# Patient Record
Sex: Male | Born: 1950 | Race: White | Hispanic: No | Marital: Married | Smoking: Former smoker
Health system: Southern US, Community
[De-identification: ages and names within clinical notes are randomized; demographics above are authoritative.]

## PROBLEM LIST (undated history)

## (undated) DIAGNOSIS — L4050 Arthropathic psoriasis, unspecified: Secondary | ICD-10-CM

## (undated) DIAGNOSIS — L0591 Pilonidal cyst without abscess: Secondary | ICD-10-CM

## (undated) DIAGNOSIS — M1990 Unspecified osteoarthritis, unspecified site: Secondary | ICD-10-CM

## (undated) DIAGNOSIS — L409 Psoriasis, unspecified: Secondary | ICD-10-CM

## (undated) DIAGNOSIS — L723 Sebaceous cyst: Secondary | ICD-10-CM

## (undated) DIAGNOSIS — G629 Polyneuropathy, unspecified: Secondary | ICD-10-CM

## (undated) DIAGNOSIS — K219 Gastro-esophageal reflux disease without esophagitis: Secondary | ICD-10-CM

## (undated) DIAGNOSIS — L0501 Pilonidal cyst with abscess: Secondary | ICD-10-CM

## (undated) DIAGNOSIS — S7001XA Contusion of right hip, initial encounter: Secondary | ICD-10-CM

## (undated) DIAGNOSIS — W1789XA Other fall from one level to another, initial encounter: Secondary | ICD-10-CM

## (undated) DIAGNOSIS — G4730 Sleep apnea, unspecified: Secondary | ICD-10-CM

## (undated) DIAGNOSIS — S32009A Unspecified fracture of unspecified lumbar vertebra, initial encounter for closed fracture: Secondary | ICD-10-CM

## (undated) DIAGNOSIS — M545 Low back pain: Secondary | ICD-10-CM

## (undated) HISTORY — DX: Arthropathic psoriasis, unspecified: L40.50

## (undated) HISTORY — DX: Psoriasis, unspecified: L40.9

## (undated) HISTORY — DX: Other fall from one level to another, initial encounter: W17.89XA

## (undated) HISTORY — DX: Sebaceous cyst: L72.3

## (undated) HISTORY — DX: Low back pain: M54.5

## (undated) HISTORY — DX: Unspecified fracture of unspecified lumbar vertebra, initial encounter for closed fracture: S32.009A

## (undated) HISTORY — PX: CARPAL TUNNEL RELEASE: SHX101

## (undated) HISTORY — PX: OTHER SURGICAL HISTORY: SHX169

## (undated) HISTORY — DX: Contusion of right hip, initial encounter: S70.01XA

## (undated) HISTORY — DX: Pilonidal cyst with abscess: L05.01

## (undated) HISTORY — PX: COLONOSCOPY WITH PROPOFOL: SHX5780

## (undated) HISTORY — DX: Polyneuropathy, unspecified: G62.9

## (undated) HISTORY — DX: Unspecified osteoarthritis, unspecified site: M19.90

## (undated) HISTORY — PX: JOINT REPLACEMENT: SHX530

## (undated) HISTORY — DX: Pilonidal cyst without abscess: L05.91

## (undated) HISTORY — PX: KNEE ARTHROSCOPY: SUR90

## (undated) HISTORY — PX: TONSILLECTOMY: SUR1361

## (undated) HISTORY — DX: Sleep apnea, unspecified: G47.30

---

## 1999-01-19 DIAGNOSIS — L4050 Arthropathic psoriasis, unspecified: Secondary | ICD-10-CM | POA: Insufficient documentation

## 2011-04-16 ENCOUNTER — Encounter: Payer: Self-pay | Admitting: Family Medicine

## 2015-12-16 DIAGNOSIS — Z8042 Family history of malignant neoplasm of prostate: Secondary | ICD-10-CM | POA: Insufficient documentation

## 2016-01-22 DIAGNOSIS — L4050 Arthropathic psoriasis, unspecified: Secondary | ICD-10-CM | POA: Diagnosis not present

## 2016-02-23 DIAGNOSIS — L570 Actinic keratosis: Secondary | ICD-10-CM | POA: Diagnosis not present

## 2016-02-23 DIAGNOSIS — L400 Psoriasis vulgaris: Secondary | ICD-10-CM | POA: Diagnosis not present

## 2016-03-18 DIAGNOSIS — L4050 Arthropathic psoriasis, unspecified: Secondary | ICD-10-CM | POA: Diagnosis not present

## 2016-04-16 DIAGNOSIS — K644 Residual hemorrhoidal skin tags: Secondary | ICD-10-CM | POA: Diagnosis not present

## 2016-04-16 DIAGNOSIS — Z1211 Encounter for screening for malignant neoplasm of colon: Secondary | ICD-10-CM | POA: Diagnosis not present

## 2016-04-16 DIAGNOSIS — K6289 Other specified diseases of anus and rectum: Secondary | ICD-10-CM | POA: Diagnosis not present

## 2016-04-16 DIAGNOSIS — Z800 Family history of malignant neoplasm of digestive organs: Secondary | ICD-10-CM | POA: Diagnosis not present

## 2016-04-16 DIAGNOSIS — Z5181 Encounter for therapeutic drug level monitoring: Secondary | ICD-10-CM | POA: Diagnosis not present

## 2016-05-13 DIAGNOSIS — Z79899 Other long term (current) drug therapy: Secondary | ICD-10-CM | POA: Diagnosis not present

## 2016-05-13 DIAGNOSIS — L4059 Other psoriatic arthropathy: Secondary | ICD-10-CM | POA: Diagnosis not present

## 2016-05-13 DIAGNOSIS — R5383 Other fatigue: Secondary | ICD-10-CM | POA: Diagnosis not present

## 2016-05-17 DIAGNOSIS — L4059 Other psoriatic arthropathy: Secondary | ICD-10-CM | POA: Diagnosis not present

## 2016-05-25 DIAGNOSIS — L4059 Other psoriatic arthropathy: Secondary | ICD-10-CM | POA: Diagnosis not present

## 2016-06-01 DIAGNOSIS — R5383 Other fatigue: Secondary | ICD-10-CM | POA: Diagnosis not present

## 2016-07-23 DIAGNOSIS — L4050 Arthropathic psoriasis, unspecified: Secondary | ICD-10-CM | POA: Diagnosis not present

## 2016-09-15 DIAGNOSIS — L400 Psoriasis vulgaris: Secondary | ICD-10-CM | POA: Diagnosis not present

## 2016-09-15 DIAGNOSIS — L820 Inflamed seborrheic keratosis: Secondary | ICD-10-CM | POA: Diagnosis not present

## 2016-09-15 DIAGNOSIS — L570 Actinic keratosis: Secondary | ICD-10-CM | POA: Diagnosis not present

## 2016-09-15 DIAGNOSIS — L814 Other melanin hyperpigmentation: Secondary | ICD-10-CM | POA: Diagnosis not present

## 2016-09-15 DIAGNOSIS — L821 Other seborrheic keratosis: Secondary | ICD-10-CM | POA: Diagnosis not present

## 2016-09-15 DIAGNOSIS — D225 Melanocytic nevi of trunk: Secondary | ICD-10-CM | POA: Diagnosis not present

## 2016-09-16 DIAGNOSIS — Z23 Encounter for immunization: Secondary | ICD-10-CM | POA: Diagnosis not present

## 2016-09-21 DIAGNOSIS — L4050 Arthropathic psoriasis, unspecified: Secondary | ICD-10-CM | POA: Diagnosis not present

## 2016-10-04 DIAGNOSIS — L4059 Other psoriatic arthropathy: Secondary | ICD-10-CM | POA: Diagnosis not present

## 2016-10-05 DIAGNOSIS — R5383 Other fatigue: Secondary | ICD-10-CM | POA: Diagnosis not present

## 2016-10-05 DIAGNOSIS — L4059 Other psoriatic arthropathy: Secondary | ICD-10-CM | POA: Diagnosis not present

## 2016-10-05 DIAGNOSIS — R252 Cramp and spasm: Secondary | ICD-10-CM | POA: Diagnosis not present

## 2016-10-05 DIAGNOSIS — Z79899 Other long term (current) drug therapy: Secondary | ICD-10-CM | POA: Diagnosis not present

## 2016-10-05 DIAGNOSIS — K219 Gastro-esophageal reflux disease without esophagitis: Secondary | ICD-10-CM | POA: Diagnosis not present

## 2016-11-16 DIAGNOSIS — L4059 Other psoriatic arthropathy: Secondary | ICD-10-CM | POA: Diagnosis not present

## 2016-12-23 DIAGNOSIS — Z0000 Encounter for general adult medical examination without abnormal findings: Secondary | ICD-10-CM | POA: Diagnosis not present

## 2016-12-23 DIAGNOSIS — L4050 Arthropathic psoriasis, unspecified: Secondary | ICD-10-CM | POA: Diagnosis not present

## 2016-12-23 DIAGNOSIS — L4059 Other psoriatic arthropathy: Secondary | ICD-10-CM | POA: Diagnosis not present

## 2016-12-23 DIAGNOSIS — Z125 Encounter for screening for malignant neoplasm of prostate: Secondary | ICD-10-CM | POA: Diagnosis not present

## 2016-12-23 DIAGNOSIS — Z8042 Family history of malignant neoplasm of prostate: Secondary | ICD-10-CM | POA: Diagnosis not present

## 2017-01-07 DIAGNOSIS — E875 Hyperkalemia: Secondary | ICD-10-CM | POA: Diagnosis not present

## 2017-01-13 DIAGNOSIS — L4050 Arthropathic psoriasis, unspecified: Secondary | ICD-10-CM | POA: Diagnosis not present

## 2017-01-24 DIAGNOSIS — K142 Median rhomboid glossitis: Secondary | ICD-10-CM | POA: Diagnosis not present

## 2017-02-08 DIAGNOSIS — K219 Gastro-esophageal reflux disease without esophagitis: Secondary | ICD-10-CM | POA: Diagnosis not present

## 2017-02-08 DIAGNOSIS — M533 Sacrococcygeal disorders, not elsewhere classified: Secondary | ICD-10-CM | POA: Diagnosis not present

## 2017-02-08 DIAGNOSIS — L4059 Other psoriatic arthropathy: Secondary | ICD-10-CM | POA: Diagnosis not present

## 2017-02-08 DIAGNOSIS — M5136 Other intervertebral disc degeneration, lumbar region: Secondary | ICD-10-CM | POA: Diagnosis not present

## 2017-02-08 DIAGNOSIS — M545 Low back pain: Secondary | ICD-10-CM | POA: Diagnosis not present

## 2017-02-08 DIAGNOSIS — M25551 Pain in right hip: Secondary | ICD-10-CM | POA: Diagnosis not present

## 2017-02-08 DIAGNOSIS — M2578 Osteophyte, vertebrae: Secondary | ICD-10-CM | POA: Diagnosis not present

## 2017-02-08 DIAGNOSIS — Z79899 Other long term (current) drug therapy: Secondary | ICD-10-CM | POA: Diagnosis not present

## 2017-02-08 DIAGNOSIS — M25552 Pain in left hip: Secondary | ICD-10-CM | POA: Diagnosis not present

## 2017-02-08 DIAGNOSIS — M549 Dorsalgia, unspecified: Secondary | ICD-10-CM | POA: Diagnosis not present

## 2017-02-08 DIAGNOSIS — R252 Cramp and spasm: Secondary | ICD-10-CM | POA: Diagnosis not present

## 2017-02-08 DIAGNOSIS — M47816 Spondylosis without myelopathy or radiculopathy, lumbar region: Secondary | ICD-10-CM | POA: Diagnosis not present

## 2017-02-15 DIAGNOSIS — R011 Cardiac murmur, unspecified: Secondary | ICD-10-CM | POA: Diagnosis not present

## 2017-02-15 DIAGNOSIS — R9431 Abnormal electrocardiogram [ECG] [EKG]: Secondary | ICD-10-CM | POA: Diagnosis not present

## 2017-02-28 DIAGNOSIS — R011 Cardiac murmur, unspecified: Secondary | ICD-10-CM | POA: Diagnosis not present

## 2017-03-02 DIAGNOSIS — L4059 Other psoriatic arthropathy: Secondary | ICD-10-CM | POA: Diagnosis not present

## 2017-03-03 DIAGNOSIS — R9431 Abnormal electrocardiogram [ECG] [EKG]: Secondary | ICD-10-CM | POA: Diagnosis not present

## 2017-03-03 DIAGNOSIS — R079 Chest pain, unspecified: Secondary | ICD-10-CM | POA: Diagnosis not present

## 2017-03-10 DIAGNOSIS — I493 Ventricular premature depolarization: Secondary | ICD-10-CM | POA: Diagnosis not present

## 2017-03-10 DIAGNOSIS — I499 Cardiac arrhythmia, unspecified: Secondary | ICD-10-CM | POA: Diagnosis not present

## 2017-04-19 DIAGNOSIS — L4059 Other psoriatic arthropathy: Secondary | ICD-10-CM | POA: Diagnosis not present

## 2017-04-25 DIAGNOSIS — R252 Cramp and spasm: Secondary | ICD-10-CM | POA: Diagnosis not present

## 2017-04-25 DIAGNOSIS — L4059 Other psoriatic arthropathy: Secondary | ICD-10-CM | POA: Diagnosis not present

## 2017-04-25 DIAGNOSIS — M169 Osteoarthritis of hip, unspecified: Secondary | ICD-10-CM | POA: Diagnosis not present

## 2017-04-25 DIAGNOSIS — Z79899 Other long term (current) drug therapy: Secondary | ICD-10-CM | POA: Diagnosis not present

## 2017-04-25 DIAGNOSIS — K219 Gastro-esophageal reflux disease without esophagitis: Secondary | ICD-10-CM | POA: Diagnosis not present

## 2017-04-28 DIAGNOSIS — L4059 Other psoriatic arthropathy: Secondary | ICD-10-CM | POA: Diagnosis not present

## 2017-05-19 DIAGNOSIS — L409 Psoriasis, unspecified: Secondary | ICD-10-CM | POA: Diagnosis not present

## 2017-05-19 DIAGNOSIS — E669 Obesity, unspecified: Secondary | ICD-10-CM | POA: Diagnosis not present

## 2017-05-19 DIAGNOSIS — M150 Primary generalized (osteo)arthritis: Secondary | ICD-10-CM | POA: Diagnosis not present

## 2017-05-19 DIAGNOSIS — M545 Low back pain: Secondary | ICD-10-CM | POA: Diagnosis not present

## 2017-05-19 DIAGNOSIS — Z6830 Body mass index (BMI) 30.0-30.9, adult: Secondary | ICD-10-CM | POA: Diagnosis not present

## 2017-05-19 DIAGNOSIS — M25551 Pain in right hip: Secondary | ICD-10-CM | POA: Diagnosis not present

## 2017-05-19 DIAGNOSIS — M25552 Pain in left hip: Secondary | ICD-10-CM | POA: Diagnosis not present

## 2017-05-19 DIAGNOSIS — L4050 Arthropathic psoriasis, unspecified: Secondary | ICD-10-CM | POA: Diagnosis not present

## 2017-06-06 DIAGNOSIS — Z79899 Other long term (current) drug therapy: Secondary | ICD-10-CM | POA: Diagnosis not present

## 2017-06-06 DIAGNOSIS — Z789 Other specified health status: Secondary | ICD-10-CM | POA: Diagnosis not present

## 2017-06-06 DIAGNOSIS — L4050 Arthropathic psoriasis, unspecified: Secondary | ICD-10-CM | POA: Diagnosis not present

## 2017-06-06 DIAGNOSIS — T1490XA Injury, unspecified, initial encounter: Secondary | ICD-10-CM | POA: Diagnosis not present

## 2017-06-06 DIAGNOSIS — S32039A Unspecified fracture of third lumbar vertebra, initial encounter for closed fracture: Secondary | ICD-10-CM | POA: Diagnosis not present

## 2017-06-06 DIAGNOSIS — W1789XA Other fall from one level to another, initial encounter: Secondary | ICD-10-CM | POA: Diagnosis not present

## 2017-06-06 DIAGNOSIS — Y92019 Unspecified place in single-family (private) house as the place of occurrence of the external cause: Secondary | ICD-10-CM | POA: Diagnosis not present

## 2017-06-06 DIAGNOSIS — S32049A Unspecified fracture of fourth lumbar vertebra, initial encounter for closed fracture: Secondary | ICD-10-CM | POA: Diagnosis not present

## 2017-06-06 DIAGNOSIS — M6281 Muscle weakness (generalized): Secondary | ICD-10-CM | POA: Diagnosis not present

## 2017-06-06 DIAGNOSIS — S199XXA Unspecified injury of neck, initial encounter: Secondary | ICD-10-CM | POA: Diagnosis not present

## 2017-06-06 DIAGNOSIS — M5489 Other dorsalgia: Secondary | ICD-10-CM | POA: Diagnosis not present

## 2017-06-06 DIAGNOSIS — S32029A Unspecified fracture of second lumbar vertebra, initial encounter for closed fracture: Secondary | ICD-10-CM | POA: Diagnosis not present

## 2017-06-06 DIAGNOSIS — M545 Low back pain: Secondary | ICD-10-CM | POA: Diagnosis not present

## 2017-06-06 DIAGNOSIS — Z791 Long term (current) use of non-steroidal anti-inflammatories (NSAID): Secondary | ICD-10-CM | POA: Diagnosis not present

## 2017-06-06 DIAGNOSIS — S32009A Unspecified fracture of unspecified lumbar vertebra, initial encounter for closed fracture: Secondary | ICD-10-CM | POA: Insufficient documentation

## 2017-06-06 DIAGNOSIS — S3992XA Unspecified injury of lower back, initial encounter: Secondary | ICD-10-CM | POA: Diagnosis not present

## 2017-06-06 DIAGNOSIS — Y999 Unspecified external cause status: Secondary | ICD-10-CM | POA: Diagnosis not present

## 2017-06-06 DIAGNOSIS — K219 Gastro-esophageal reflux disease without esophagitis: Secondary | ICD-10-CM | POA: Diagnosis not present

## 2017-06-06 DIAGNOSIS — I951 Orthostatic hypotension: Secondary | ICD-10-CM | POA: Diagnosis not present

## 2017-06-06 DIAGNOSIS — S32019A Unspecified fracture of first lumbar vertebra, initial encounter for closed fracture: Secondary | ICD-10-CM | POA: Diagnosis not present

## 2017-06-06 DIAGNOSIS — S0990XA Unspecified injury of head, initial encounter: Secondary | ICD-10-CM | POA: Diagnosis not present

## 2017-06-06 DIAGNOSIS — Z7289 Other problems related to lifestyle: Secondary | ICD-10-CM | POA: Diagnosis not present

## 2017-06-06 DIAGNOSIS — T148XXA Other injury of unspecified body region, initial encounter: Secondary | ICD-10-CM | POA: Diagnosis not present

## 2017-06-06 DIAGNOSIS — S300XXA Contusion of lower back and pelvis, initial encounter: Secondary | ICD-10-CM | POA: Diagnosis not present

## 2017-06-06 DIAGNOSIS — R51 Headache: Secondary | ICD-10-CM | POA: Diagnosis not present

## 2017-06-06 DIAGNOSIS — S3993XA Unspecified injury of pelvis, initial encounter: Secondary | ICD-10-CM | POA: Diagnosis not present

## 2017-06-06 HISTORY — DX: Unspecified fracture of unspecified lumbar vertebra, initial encounter for closed fracture: S32.009A

## 2017-06-07 DIAGNOSIS — M5459 Other low back pain: Secondary | ICD-10-CM

## 2017-06-07 DIAGNOSIS — W1789XA Other fall from one level to another, initial encounter: Secondary | ICD-10-CM

## 2017-06-07 DIAGNOSIS — M545 Low back pain: Secondary | ICD-10-CM | POA: Diagnosis not present

## 2017-06-07 DIAGNOSIS — S32019A Unspecified fracture of first lumbar vertebra, initial encounter for closed fracture: Secondary | ICD-10-CM | POA: Diagnosis not present

## 2017-06-07 DIAGNOSIS — T1490XA Injury, unspecified, initial encounter: Secondary | ICD-10-CM | POA: Diagnosis not present

## 2017-06-07 DIAGNOSIS — Z789 Other specified health status: Secondary | ICD-10-CM | POA: Insufficient documentation

## 2017-06-07 HISTORY — DX: Other low back pain: M54.59

## 2017-06-07 HISTORY — DX: Other fall from one level to another, initial encounter: W17.89XA

## 2017-06-15 DIAGNOSIS — L4050 Arthropathic psoriasis, unspecified: Secondary | ICD-10-CM | POA: Diagnosis not present

## 2017-06-19 ENCOUNTER — Other Ambulatory Visit: Payer: Self-pay

## 2017-06-19 ENCOUNTER — Emergency Department
Admission: EM | Admit: 2017-06-19 | Discharge: 2017-06-19 | Disposition: A | Discharge status: Home / Self Care | Payer: Medicare Other | Attending: Emergency Medicine | Admitting: Emergency Medicine

## 2017-06-19 DIAGNOSIS — Y939 Activity, unspecified: Secondary | ICD-10-CM | POA: Diagnosis not present

## 2017-06-19 DIAGNOSIS — Y929 Unspecified place or not applicable: Secondary | ICD-10-CM | POA: Diagnosis not present

## 2017-06-19 DIAGNOSIS — S300XXA Contusion of lower back and pelvis, initial encounter: Secondary | ICD-10-CM | POA: Diagnosis not present

## 2017-06-19 DIAGNOSIS — X58XXXA Exposure to other specified factors, initial encounter: Secondary | ICD-10-CM | POA: Insufficient documentation

## 2017-06-19 DIAGNOSIS — Y999 Unspecified external cause status: Secondary | ICD-10-CM | POA: Diagnosis not present

## 2017-06-19 DIAGNOSIS — L0501 Pilonidal cyst with abscess: Secondary | ICD-10-CM

## 2017-06-19 DIAGNOSIS — Z79899 Other long term (current) drug therapy: Secondary | ICD-10-CM | POA: Insufficient documentation

## 2017-06-19 DIAGNOSIS — Z96653 Presence of artificial knee joint, bilateral: Secondary | ICD-10-CM | POA: Diagnosis not present

## 2017-06-19 DIAGNOSIS — S3992XA Unspecified injury of lower back, initial encounter: Secondary | ICD-10-CM | POA: Diagnosis present

## 2017-06-19 HISTORY — DX: Unspecified osteoarthritis, unspecified site: M19.90

## 2017-06-19 MED ORDER — CLINDAMYCIN HCL 300 MG PO CAPS: 300.0000 mg | ORAL_CAPSULE | Freq: Three times a day (TID) | ORAL | 0 refills | Status: DC

## 2017-06-19 MED ORDER — LIDOCAINE-EPINEPHRINE (PF) 1 %-1:200000 IJ SOLN
30.0000 mL | Freq: Once | INTRAMUSCULAR | Status: DC
  Filled 2017-06-19: qty 30

## 2017-06-19 MED ORDER — LIDOCAINE-EPINEPHRINE 1 %-1:100000 IJ SOLN
INTRAMUSCULAR | Status: AC
  Administered 2017-06-19: 10:00:00
  Filled 2017-06-19: qty 1

## 2017-06-19 NOTE — ED Triage Notes (Signed)
Pt c/o cyst that he had for years on his back, states he fell about 8 days ago and was seen and treated for the fall. States when he fell the cyst ruptured and has had drainage and the site has gotten larger. No noted redness

## 2017-06-19 NOTE — ED Provider Notes (Signed)
[facility] Emergency Department Provider Note  ____________________________________________  Time seen: Approximately 8:50 AM  I have reviewed the triage vital signs and the nursing notes.   HISTORY  Chief Complaint Abscess   HPI Anthony Greene is a 67 y.o. male who presents to the emergency department for evaluation and treatment of abscess. He has had a "cyst" near his tailbone for the past 30 ish years. He fell just over a week ago and the cyst burst. He also has had a large hematoma in that area since the fall. He was evaluated after the fall and has appointments scheduled with PCP and orthopedics. He has had chills and body aches, but no known fever. No improvement of the pain with Norco.  Past Medical History:  Diagnosis Date  . Arthritis     There are no active problems to display for this patient.   Past Surgical History:  Procedure Laterality Date  . JOINT REPLACEMENT     BL knee    Prior to Admission medications   Medication Sig Start Date End Date Taking? Authorizing Provider  celecoxib (CELEBREX) 200 MG capsule Take 200 mg by mouth 2 (two) times daily.   Yes [provider]  HYDROcodone-acetaminophen (NORCO/VICODIN) 5-325 MG tablet Take 1 tablet by mouth every 6 (six) hours as needed for moderate pain.   Yes [provider]  clindamycin (CLEOCIN) 300 MG capsule Take 1 capsule (300 mg total) by mouth 3 (three) times daily for 10 days. 06/19/17 06/29/17  Sherrie George B, FNP    Allergies Vancomycin and Penicillins  History reviewed. No pertinent family history.  Social History Social History   Tobacco Use  . Smoking status: Never Smoker  . Smokeless tobacco: Never Used  Substance Use Topics  . Alcohol use: Yes  . Drug use: Not Currently    Review of Systems  Constitutional: Negative for fever. Respiratory: Negative for cough or shortness of breath.  Musculoskeletal: Positive for myalgias Skin: Positive for  abscess and hematoma Neurological: Negative for numbness or paresthesias. ____________________________________________   PHYSICAL EXAM:  VITAL SIGNS: ED Triage Vitals [06/19/17 0825]  Enc Vitals Group     BP (!) 145/77     Pulse Rate 89     Resp 17     Temp 99.1 F (37.3 C)     Temp Source Oral     SpO2 99 %     Weight 215 lb (97.5 kg)     Height 5\' 10"  (1.778 m)     Head Circumference      Peak Flow      Pain Score 7     Pain Loc      Pain Edu?      Excl. in [place]?      Constitutional: Uncomfortable appearing. Eyes: Conjunctivae are clear without discharge or drainage. Nose: No rhinorrhea noted. Mouth/Throat: Airway is patent.  Neck: No stridor. Unrestricted range of motion observed.  Cardiovascular: Capillary refill is <3 seconds.  Respiratory: Respirations are even and unlabored.. Musculoskeletal: Unrestricted range of motion observed. Neurologic: Awake, alert, and oriented x 4.  Skin:  8cm x 6cm area of fluctuance on right lower, medial buttock with surrounding ecchymosis.  ____________________________________________   LABS (all labs ordered are listed, but only abnormal results are displayed)  Labs Reviewed - No data to display ____________________________________________  EKG  Not indicated. ____________________________________________  RADIOLOGY  Not indicated. ____________________________________________   PROCEDURES  .Marland KitchenIncision and Drainage Date/Time: 06/19/2017 12:40 PM Performed by: Victorino Dike,  FNP Authorized by: Victorino Dike, FNP   Consent:    Consent obtained:  Verbal   Consent given by:  Patient   Risks discussed:  Bleeding, infection, incomplete drainage and pain   Alternatives discussed:  Alternative treatment, delayed treatment and observation Location:    Type:  Pilonidal cyst   Size:  8x6 Anesthesia (see MAR for exact dosages):    Anesthesia method:  Local infiltration   Local anesthetic:  Lidocaine 1% WITH  epi Procedure type:    Complexity:  Complex Procedure details:    Incision types:  Single straight   Incision depth:  Dermal   Scalpel blade:  11   Wound management:  Probed and deloculated   Drainage:  Bloody (and keratin)   Drainage amount:  Copious   Wound treatment:  Drain placed   Packing materials:  1/4 in iodoform gauze Post-procedure details:    Patient tolerance of procedure:  Tolerated well, no immediate complications Comments:     Roughly 237ml of malodorous, thick, keratin plus dark red blood expressed from 1cm incision.   ___________________________________________   INITIAL IMPRESSION / ASSESSMENT AND PLAN / ED COURSE  Anthony Greene is a 67 y.o. male who presents to the emergency department for treatment and evaluation of pilonidal cyst/abscess. I&D performed with at least 274ml of drainage. Hematoma drained spontaneously after keratin was expressed from cyst. Patient's wife is to remove packing in 2 days and repack the wound. He is to follow up with the PCP before the end of the week. They are to return to the ER for symptoms of concern if unable to schedule an appointment.  Medications  lidocaine-EPINEPHrine (XYLOCAINE-EPINEPHrine) 1 %-1:200000 (PF) injection 30 mL (has no administration in time range)  lidocaine-EPINEPHrine (XYLOCAINE W/EPI) 1 %-1:100000 (with pres) injection (  Given by Other 06/19/17 1003)     Pertinent labs & imaging results that were available during my care of the patient were reviewed by me and considered in my medical decision making (see chart for details). ____________________________________________   FINAL CLINICAL IMPRESSION(S) / ED DIAGNOSES  Final diagnoses:  Pilonidal abscess  Traumatic hematoma of buttock, initial encounter    ED Discharge Orders        Ordered    clindamycin (CLEOCIN) 300 MG capsule  3 times daily     06/19/17 0943       Note:  This document was prepared using Dragon voice recognition software and  may include unintentional dictation errors.    Victorino Dike, FNP 06/19/17 1250    Schuyler Amor, MD 06/19/17 1348

## 2017-06-19 NOTE — ED Notes (Signed)
See triage note  Wife states he fell and had some fx to back  Had a  Abscess area to lower back at that time  Area began to drain by itself until Friday  Now area is larger and more painful

## 2017-06-19 NOTE — Discharge Instructions (Signed)
Remove and repack the open area in 2 days. Leave that packing in for 2 additional days, then remove it. If there is still blood or drainage return to the ER or see primary care.

## 2017-06-20 DIAGNOSIS — L0501 Pilonidal cyst with abscess: Secondary | ICD-10-CM | POA: Diagnosis not present

## 2017-06-20 DIAGNOSIS — T148XXA Other injury of unspecified body region, initial encounter: Secondary | ICD-10-CM | POA: Diagnosis not present

## 2017-06-20 DIAGNOSIS — W1789XA Other fall from one level to another, initial encounter: Secondary | ICD-10-CM | POA: Diagnosis not present

## 2017-06-20 DIAGNOSIS — S32009A Unspecified fracture of unspecified lumbar vertebra, initial encounter for closed fracture: Secondary | ICD-10-CM | POA: Diagnosis not present

## 2017-06-22 DIAGNOSIS — L0591 Pilonidal cyst without abscess: Secondary | ICD-10-CM | POA: Insufficient documentation

## 2017-06-22 DIAGNOSIS — L0501 Pilonidal cyst with abscess: Secondary | ICD-10-CM

## 2017-06-22 HISTORY — DX: Pilonidal cyst with abscess: L05.01

## 2017-06-22 HISTORY — DX: Pilonidal cyst without abscess: L05.91

## 2017-06-23 ENCOUNTER — Encounter
Admission: EM | Disposition: A | Discharge status: Home / Self Care | Payer: Self-pay | Source: Home / Self Care | Attending: Surgery

## 2017-06-23 ENCOUNTER — Other Ambulatory Visit: Payer: Self-pay

## 2017-06-23 ENCOUNTER — Observation Stay: Payer: Medicare Other | Admitting: Anesthesiology

## 2017-06-23 ENCOUNTER — Emergency Department: Payer: Medicare Other

## 2017-06-23 ENCOUNTER — Encounter: Payer: Self-pay | Admitting: Medical Oncology

## 2017-06-23 ENCOUNTER — Inpatient Hospital Stay
Admission: EM | Admit: 2017-06-23 | Discharge: 2017-06-26 | DRG: 581 | Disposition: A | Discharge status: Home / Self Care | Payer: Medicare Other | Attending: Surgery | Admitting: Surgery

## 2017-06-23 DIAGNOSIS — S7001XA Contusion of right hip, initial encounter: Secondary | ICD-10-CM

## 2017-06-23 DIAGNOSIS — S300XXS Contusion of lower back and pelvis, sequela: Secondary | ICD-10-CM | POA: Diagnosis not present

## 2017-06-23 DIAGNOSIS — M47816 Spondylosis without myelopathy or radiculopathy, lumbar region: Secondary | ICD-10-CM | POA: Diagnosis present

## 2017-06-23 DIAGNOSIS — L0501 Pilonidal cyst with abscess: Secondary | ICD-10-CM

## 2017-06-23 DIAGNOSIS — L0231 Cutaneous abscess of buttock: Principal | ICD-10-CM

## 2017-06-23 DIAGNOSIS — Z880 Allergy status to penicillin: Secondary | ICD-10-CM

## 2017-06-23 DIAGNOSIS — Z96653 Presence of artificial knee joint, bilateral: Secondary | ICD-10-CM | POA: Diagnosis present

## 2017-06-23 DIAGNOSIS — W133XXS Fall through floor, sequela: Secondary | ICD-10-CM | POA: Diagnosis present

## 2017-06-23 DIAGNOSIS — L4050 Arthropathic psoriasis, unspecified: Secondary | ICD-10-CM | POA: Diagnosis present

## 2017-06-23 HISTORY — PX: IRRIGATION AND DEBRIDEMENT BUTTOCKS: SHX6601

## 2017-06-23 HISTORY — DX: Contusion of right hip, initial encounter: S70.01XA

## 2017-06-23 LAB — BASIC METABOLIC PANEL
Anion gap: 9 (ref 5–15)
BUN: 9 mg/dL (ref 6–20)
CO2: 24 mmol/L (ref 22–32)
Calcium: 8.6 mg/dL — ABNORMAL LOW (ref 8.9–10.3)
Chloride: 101 mmol/L (ref 101–111)
Creatinine, Ser: 0.85 mg/dL (ref 0.61–1.24)
GFR calc Af Amer: >60 mL/min (ref >60)
GFR calc non Af Amer: >60 mL/min (ref >60)
Glucose, Bld: 102 mg/dL — ABNORMAL HIGH (ref 65–99)
Potassium: 4 mmol/L (ref 3.5–5.1)
Sodium: 134 mmol/L — ABNORMAL LOW (ref 135–145)

## 2017-06-23 LAB — URINALYSIS, COMPLETE (UACMP) WITH MICROSCOPIC
Bacteria, UA: NONE SEEN
Bilirubin Urine: NEGATIVE
Glucose, UA: NEGATIVE mg/dL
Hgb urine dipstick: NEGATIVE
Ketones, ur: NEGATIVE mg/dL
Leukocytes, UA: NEGATIVE
Nitrite: NEGATIVE
Protein, ur: NEGATIVE mg/dL
Specific Gravity, Urine: 1.003 — ABNORMAL LOW (ref 1.005–1.030)
Squamous Epithelial / LPF: NONE SEEN (ref 0–5)
pH: 7 (ref 5.0–8.0)

## 2017-06-23 LAB — CBC WITH DIFFERENTIAL/PLATELET
Basophils Absolute: 0.1 10*3/uL (ref 0–0.1)
Basophils Relative: 1 %
Eosinophils Absolute: 0.1 10*3/uL (ref 0–0.7)
Eosinophils Relative: 1 %
HCT: 34.4 % — ABNORMAL LOW (ref 40.0–52.0)
Hemoglobin: 11.8 g/dL — ABNORMAL LOW (ref 13.0–18.0)
Lymphocytes Relative: 14 %
Lymphs Abs: 1.2 10*3/uL (ref 1.0–3.6)
MCH: 31.6 pg (ref 26.0–34.0)
MCHC: 34.3 g/dL (ref 32.0–36.0)
MCV: 92.3 fL (ref 80.0–100.0)
Monocytes Absolute: 1.1 10*3/uL — ABNORMAL HIGH (ref 0.2–1.0)
Monocytes Relative: 13 %
Neutro Abs: 6 10*3/uL (ref 1.4–6.5)
Neutrophils Relative %: 71 %
Platelets: 580 10*3/uL — ABNORMAL HIGH (ref 150–440)
RBC: 3.73 MIL/uL — ABNORMAL LOW (ref 4.40–5.90)
RDW: 14.3 % (ref 11.5–14.5)
WBC: 8.5 10*3/uL (ref 3.8–10.6)

## 2017-06-23 LAB — SURGICAL PCR SCREEN
MRSA, PCR: NEGATIVE
Staphylococcus aureus: NEGATIVE

## 2017-06-23 SURGERY — IRRIGATION AND DEBRIDEMENT BUTTOCKS
Anesthesia: General | Site: Buttocks | Laterality: Right | Wound class: Dirty or Infected

## 2017-06-23 MED ORDER — PROMETHAZINE HCL 25 MG/ML IJ SOLN: 6.2500 mg | INTRAMUSCULAR | Status: DC | PRN

## 2017-06-23 MED ORDER — BUPIVACAINE HCL (PF) 0.5 % IJ SOLN
INTRAMUSCULAR | Status: AC
  Filled 2017-06-23: qty 30

## 2017-06-23 MED ORDER — KETOROLAC TROMETHAMINE 15 MG/ML IJ SOLN
15.0000 mg | Freq: Four times a day (QID) | INTRAMUSCULAR | Status: DC
  Filled 2017-06-23: qty 1

## 2017-06-23 MED ORDER — HYDROMORPHONE HCL 1 MG/ML IJ SOLN
0.5000 mg | INTRAMUSCULAR | Status: DC | PRN
Start: 2017-06-23 — End: 2017-06-26
  Filled 2017-06-23: qty 0.5

## 2017-06-23 MED ORDER — ONDANSETRON HCL 4 MG PO TABS: 4.0000 mg | ORAL_TABLET | Freq: Four times a day (QID) | ORAL | Status: DC | PRN

## 2017-06-23 MED ORDER — LIDOCAINE HCL (PF) 2 % IJ SOLN
INTRAMUSCULAR | Status: AC
  Filled 2017-06-23: qty 10

## 2017-06-23 MED ORDER — ONDANSETRON HCL 4 MG/2ML IJ SOLN: 4.0000 mg | Freq: Four times a day (QID) | INTRAMUSCULAR | Status: DC | PRN

## 2017-06-23 MED ORDER — DEXMEDETOMIDINE HCL IN NACL 200 MCG/50ML IV SOLN
INTRAVENOUS | Status: DC | PRN
  Administered 2017-06-23: 8 ug via INTRAVENOUS
  Administered 2017-06-23: 12 ug via INTRAVENOUS

## 2017-06-23 MED ORDER — FENTANYL CITRATE (PF) 100 MCG/2ML IJ SOLN
INTRAMUSCULAR | Status: AC
  Filled 2017-06-23: qty 2

## 2017-06-23 MED ORDER — FENTANYL CITRATE (PF) 100 MCG/2ML IJ SOLN
INTRAMUSCULAR | Status: DC | PRN
  Administered 2017-06-23 (×2): 50 ug via INTRAVENOUS
  Administered 2017-06-23: 100 ug via INTRAVENOUS

## 2017-06-23 MED ORDER — IOHEXOL 300 MG/ML  SOLN
100.0000 mL | Freq: Once | INTRAMUSCULAR | Status: AC | PRN
  Administered 2017-06-23: 100 mL via INTRAVENOUS
  Filled 2017-06-23: qty 100

## 2017-06-23 MED ORDER — SUCCINYLCHOLINE CHLORIDE 20 MG/ML IJ SOLN
INTRAMUSCULAR | Status: DC | PRN
  Administered 2017-06-23: 100 mg via INTRAVENOUS

## 2017-06-23 MED ORDER — PHENYLEPHRINE HCL 10 MG/ML IJ SOLN
INTRAMUSCULAR | Status: DC | PRN
  Administered 2017-06-23 (×3): 100 ug via INTRAVENOUS

## 2017-06-23 MED ORDER — HEPARIN SODIUM (PORCINE) 5000 UNIT/ML IJ SOLN
5000.0000 [IU] | Freq: Three times a day (TID) | INTRAMUSCULAR | Status: DC
  Administered 2017-06-23: 5000 [IU] via SUBCUTANEOUS

## 2017-06-23 MED ORDER — LIDOCAINE-EPINEPHRINE (PF) 2 %-1:200000 IJ SOLN
20.0000 mL | Freq: Once | INTRAMUSCULAR | Status: DC
  Filled 2017-06-23: qty 20

## 2017-06-23 MED ORDER — ONDANSETRON HCL 4 MG/2ML IJ SOLN
INTRAMUSCULAR | Status: AC
  Filled 2017-06-23: qty 2

## 2017-06-23 MED ORDER — LIDOCAINE HCL (PF) 1 % IJ SOLN
INTRAMUSCULAR | Status: AC
  Filled 2017-06-23: qty 30

## 2017-06-23 MED ORDER — MUPIROCIN 2 % EX OINT
1.0000 "application " | TOPICAL_OINTMENT | Freq: Two times a day (BID) | CUTANEOUS | Status: DC
  Filled 2017-06-23: qty 22

## 2017-06-23 MED ORDER — CLINDAMYCIN PHOSPHATE 600 MG/50ML IV SOLN
600.0000 mg | Freq: Once | INTRAVENOUS | Status: AC
  Administered 2017-06-23: 600 mg via INTRAVENOUS
  Filled 2017-06-23: qty 50

## 2017-06-23 MED ORDER — FENTANYL CITRATE (PF) 100 MCG/2ML IJ SOLN: 25.0000 ug | INTRAMUSCULAR | Status: DC | PRN

## 2017-06-23 MED ORDER — PROPOFOL 10 MG/ML IV BOLUS
INTRAVENOUS | Status: AC
Start: 2017-06-23 — End: ?
  Filled 2017-06-23: qty 20

## 2017-06-23 MED ORDER — GLYCOPYRROLATE 0.2 MG/ML IJ SOLN
INTRAMUSCULAR | Status: DC | PRN
  Administered 2017-06-23: 0.2 mg via INTRAVENOUS

## 2017-06-23 MED ORDER — BUPIVACAINE HCL (PF) 0.5 % IJ SOLN
INTRAMUSCULAR | Status: DC | PRN
  Administered 2017-06-23: 10 mL

## 2017-06-23 MED ORDER — SUCCINYLCHOLINE CHLORIDE 20 MG/ML IJ SOLN
INTRAMUSCULAR | Status: AC
  Filled 2017-06-23: qty 1

## 2017-06-23 MED ORDER — HEPARIN SODIUM (PORCINE) 5000 UNIT/ML IJ SOLN
INTRAMUSCULAR | Status: AC
  Administered 2017-06-23: 5000 [IU] via SUBCUTANEOUS
  Filled 2017-06-23: qty 1

## 2017-06-23 MED ORDER — FENTANYL CITRATE (PF) 100 MCG/2ML IJ SOLN
50.0000 ug | Freq: Once | INTRAMUSCULAR | Status: AC
  Administered 2017-06-23: 50 ug via INTRAVENOUS
  Filled 2017-06-23: qty 2

## 2017-06-23 MED ORDER — TRAMADOL HCL 50 MG PO TABS
50.0000 mg | ORAL_TABLET | Freq: Two times a day (BID) | ORAL | Status: DC
Start: 2017-06-23 — End: 2017-06-23
  Filled 2017-06-23: qty 1

## 2017-06-23 MED ORDER — LIDOCAINE HCL (CARDIAC) PF 100 MG/5ML IV SOSY
PREFILLED_SYRINGE | INTRAVENOUS | Status: DC | PRN
  Administered 2017-06-23: 50 mg via INTRAVENOUS

## 2017-06-23 MED ORDER — FENTANYL CITRATE (PF) 100 MCG/2ML IJ SOLN
25.0000 ug | INTRAMUSCULAR | Status: DC | PRN
  Administered 2017-06-23 (×2): 50 ug via INTRAVENOUS

## 2017-06-23 MED ORDER — ONDANSETRON HCL 4 MG/2ML IJ SOLN
INTRAMUSCULAR | Status: DC | PRN
  Administered 2017-06-23: 4 mg via INTRAVENOUS

## 2017-06-23 MED ORDER — DEXAMETHASONE SODIUM PHOSPHATE 10 MG/ML IJ SOLN
INTRAMUSCULAR | Status: DC | PRN
  Administered 2017-06-23: 5 mg via INTRAVENOUS

## 2017-06-23 MED ORDER — BUPIVACAINE HCL (PF) 0.5 % IJ SOLN
30.0000 mL | Freq: Once | INTRAMUSCULAR | Status: DC
  Filled 2017-06-23: qty 30

## 2017-06-23 MED ORDER — LACTATED RINGERS IV SOLN
INTRAVENOUS | Status: DC
  Administered 2017-06-23 – 2017-06-25 (×4): via INTRAVENOUS

## 2017-06-23 MED ORDER — PROPOFOL 10 MG/ML IV BOLUS
INTRAVENOUS | Status: DC | PRN
  Administered 2017-06-23: 150 mg via INTRAVENOUS
  Administered 2017-06-23: 50 mg via INTRAVENOUS

## 2017-06-23 MED ORDER — HYDROCODONE-ACETAMINOPHEN 5-325 MG PO TABS
1.0000 | ORAL_TABLET | Freq: Once | ORAL | Status: AC
  Administered 2017-06-23: 1 via ORAL
  Filled 2017-06-23: qty 1

## 2017-06-23 MED ORDER — HYDROCODONE-ACETAMINOPHEN 7.5-325 MG PO TABS: 1.0000 | ORAL_TABLET | Freq: Once | ORAL | Status: DC | PRN

## 2017-06-23 MED ORDER — ONDANSETRON HCL 4 MG/2ML IJ SOLN
4.0000 mg | Freq: Once | INTRAMUSCULAR | Status: AC
  Administered 2017-06-23: 4 mg via INTRAVENOUS
  Filled 2017-06-23: qty 2

## 2017-06-23 MED ORDER — CIPROFLOXACIN IN D5W 400 MG/200ML IV SOLN
400.0000 mg | Freq: Two times a day (BID) | INTRAVENOUS | Status: DC
  Administered 2017-06-23 – 2017-06-26 (×6): 400 mg via INTRAVENOUS
  Filled 2017-06-23 (×8): qty 200

## 2017-06-23 MED ORDER — LIDOCAINE HCL (PF) 1 % IJ SOLN
INTRAMUSCULAR | Status: DC | PRN
  Administered 2017-06-23: 10 mL via INTRADERMAL

## 2017-06-23 SURGICAL SUPPLY — 28 items
BLADE SURG 11 STRL SS SAFETY (MISCELLANEOUS) ×2 IMPLANT
BNDG GAUZE 4.5X4.1 6PLY STRL (MISCELLANEOUS) ×2 IMPLANT
BRIEF STRETCH MATERNITY 2XLG (MISCELLANEOUS) IMPLANT
DRAIN PENROSE 5/8X12 LTX STRL (DRAIN) ×2 IMPLANT
DRAPE LAPAROTOMY 100X77 ABD (DRAPES) ×2 IMPLANT
DRAPE UTILITY 15X26 TOWEL STRL (DRAPES) ×2 IMPLANT
ELECT CAUTERY BLADE 6.4 (BLADE) ×2 IMPLANT
ELECT REM PT RETURN 9FT ADLT (ELECTROSURGICAL) ×2
ELECTRODE REM PT RTRN 9FT ADLT (ELECTROSURGICAL) ×1 IMPLANT
GAUZE PACKING IODOFORM 1/2 (PACKING) ×2 IMPLANT
GAUZE SPONGE 4X4 12PLY STRL (GAUZE/BANDAGES/DRESSINGS) IMPLANT
GLOVE BIO SURGEON STRL SZ7 (GLOVE) ×4 IMPLANT
GLOVE BIOGEL PI IND STRL 7.5 (GLOVE) ×2 IMPLANT
GLOVE BIOGEL PI INDICATOR 7.5 (GLOVE) ×2
GOWN STRL REUS W/ TWL LRG LVL3 (GOWN DISPOSABLE) ×2 IMPLANT
GOWN STRL REUS W/TWL LRG LVL3 (GOWN DISPOSABLE) ×2
KIT TURNOVER KIT A (KITS) ×2 IMPLANT
NEEDLE HYPO 22GX1.5 SAFETY (NEEDLE) ×2 IMPLANT
NS IRRIG 500ML POUR BTL (IV SOLUTION) ×2 IMPLANT
PACK BASIN MINOR ARMC (MISCELLANEOUS) ×2 IMPLANT
PAD ABD DERMACEA PRESS 5X9 (GAUZE/BANDAGES/DRESSINGS) IMPLANT
SPONGE LAP 18X18 5 PK (GAUZE/BANDAGES/DRESSINGS) ×2 IMPLANT
SPONGE XRAY 4X4 16PLY STRL (MISCELLANEOUS) ×2 IMPLANT
SWAB DUAL CULTURE TRANS RED ST (MISCELLANEOUS) ×2 IMPLANT
SYR 10ML LL (SYRINGE) ×2 IMPLANT
SYR 20CC LL (SYRINGE) ×2 IMPLANT
SYR BULB 3OZ (MISCELLANEOUS) ×2 IMPLANT
SYR BULB IRRIG 60ML STRL (SYRINGE) ×2 IMPLANT

## 2017-06-23 NOTE — ED Notes (Signed)
Dr Cooper in with pt 

## 2017-06-23 NOTE — ED Notes (Signed)
Resting at present with family at bedside

## 2017-06-23 NOTE — ED Provider Notes (Signed)
[facility] Emergency Department Provider Note ____________________________________________  Time seen: 64  I have reviewed the triage vital signs and the nursing notes.  HISTORY  Chief Complaint  Abscess  HPI Anthony Greene is a 67 y.o. male returns to the ED accompanied by his wife, for re-evaluation of his recently I&D'd pilonidal cyst abscess. The patient gives a 30-year history of a stable cyst. He sustained a mechanical fall from the ceiling 2 weeks ago. He apparently sustained multiple transverse fractures of his lumbar vertebra. He also developed a large hematoma to the right buttock and caused acute infection & inflammation to the once stable pilonidal. It was I&D'd 4 days prior with release of a large amount of purulent, bloody, and keratinous material. It was packed with iodoform and he was released with wound care instructions and supplies.   Past Medical History:  Diagnosis Date  . Arthritis     Patient Active Problem List   Diagnosis Date Noted  . Hematoma of hip, right, initial encounter 06/23/2017    Past Surgical History:  Procedure Laterality Date  . JOINT REPLACEMENT     BL knee    Prior to Admission medications   Medication Sig Start Date End Date Taking? Authorizing Provider  celecoxib (CELEBREX) 200 MG capsule Take 200 mg by mouth 2 (two) times daily.    [provider]  clindamycin (CLEOCIN) 300 MG capsule Take 1 capsule (300 mg total) by mouth 3 (three) times daily for 10 days. 06/19/17 06/29/17  Triplett, Johnette Abraham B, FNP  HYDROcodone-acetaminophen (NORCO/VICODIN) 5-325 MG tablet Take 1 tablet by mouth every 6 (six) hours as needed for moderate pain.    [provider]   Allergies Vancomycin and Penicillins  No family history on file.  Social History Social History   Tobacco Use  . Smoking status: Never Smoker  . Smokeless tobacco: Never Used  Substance Use Topics  . Alcohol use: Yes  . Drug use: Not  Currently    Review of Systems  Constitutional: Negative for fever. Eyes: Negative for visual changes. ENT: Negative for sore throat. Cardiovascular: Negative for chest pain. Respiratory: Negative for shortness of breath. Gastrointestinal: Negative for abdominal pain, vomiting and diarrhea. Genitourinary: Negative for dysuria. Musculoskeletal: Negative for back pain. Skin: Negative for rash. Neurological: Negative for headaches, focal weakness or numbness. ____________________________________________  PHYSICAL EXAM:  VITAL SIGNS: ED Triage Vitals [06/23/17 0927]  Enc Vitals Group     BP 137/88     Pulse Rate 89     Resp 16     Temp 98.1 F (36.7 C)     Temp Source Oral     SpO2 95 %     Weight 215 lb (97.5 kg)     Height 5\' 10"  (1.778 m)     Head Circumference      Peak Flow      Pain Score 4     Pain Loc      Pain Edu?      Excl. in [place]?     Constitutional: Alert and oriented. Well appearing and in no distress. Head: Normocephalic and atraumatic. Eyes: Conjunctivae are normal. Normal extraocular movements Cardiovascular: Normal rate, regular rhythm. Normal distal pulses. Respiratory: Normal respiratory effort. No wheezes/rales/rhonchi. Gastrointestinal: Soft and nontender. No distention. Musculoskeletal: Nontender with normal range of motion in all extremities.  Neurologic:  Normal gait without ataxia. Normal speech and language. No gross focal neurologic deficits are appreciated. Skin:  Skin is warm, dry and intact. No rash  noted. Large fluctuant area of suspected abscess overlying the sacrum superior to the gluteal cleft and extending toward the right SI joint. There is loose purulent drainage with manipulation. There is an area of erythema and fluctuance over the right buttock. Stable, resolving ecchymosis is noted down the lateral right thigh.  ____________________________________________   LABS (pertinent positives/negatives)  Labs Reviewed  BASIC METABOLIC  PANEL - Abnormal; Notable for the following components:      Result Value   Sodium 134 (*)    Glucose, Bld 102 (*)    Calcium 8.6 (*)    All other components within normal limits  CBC WITH DIFFERENTIAL/PLATELET - Abnormal; Notable for the following components:   RBC 3.73 (*)    Hemoglobin 11.8 (*)    HCT 34.4 (*)    Platelets 580 (*)    Monocytes Absolute 1.1 (*)    All other components within normal limits  URINALYSIS, COMPLETE (UACMP) WITH MICROSCOPIC - Abnormal; Notable for the following components:   Color, Urine YELLOW (*)    APPearance CLEAR (*)    Specific Gravity, Urine 1.003 (*)    All other components within normal limits  AEROBIC CULTURE (SUPERFICIAL SPECIMEN)  HIV ANTIBODY (ROUTINE TESTING)  CBC  CREATININE, SERUM  ____________________________________________   RADIOLOGY  CT Pelvis w/ CM  IMPRESSION: 17.4 x 15.1 x 7.5 cm abscess posterior to the sacrum and extending into the posterior aspect of the right buttocks, as described above. ____________________________________________  PROCEDURES  Procedures Fentanyl 50 mcg IV Clindamycin 600 mg IVPB Zofran 4 mg IVP ____________________________________________  INITIAL IMPRESSION / ASSESSMENT AND PLAN / ED COURSE  Patient with ED re-evaluation of an expanding large sacral abscess. His recovery has been complicated by his concurrent transverse lumbar spine fractures and poor pain control. 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 They have declined a bedside I&D and outpatient wound management.   ----------------------------------------- 12:52 PM on 06/23/2017 ----------------------------------------- I have notified Dr. Burt Knack of the patient status and CT results.   ----------------------------------------- 1:15 PM on 06/23/2017 ----------------------------------------- Notified Dr. Burt Knack (surgery) that patient had declined repeated attempt at bedside I&D,  that he initially suggested. He will see the patient in the ED and admit for surgical drainage in the OR tonight.  ____________________________________________  FINAL CLINICAL IMPRESSION(S) / ED DIAGNOSES  Final diagnoses:  Pilonidal abscess      Carmie End, Dannielle Karvonen, PA-C 06/23/17 1424    Merlyn Lot, MD 06/23/17 [phone]

## 2017-06-23 NOTE — ED Notes (Signed)
See triage note  Presents with abscess to buttocks  Was seen about 4 days ago   Had pilonidal cyst lanced and packed  Also had a release of hematoma to area  Per wife the area is larger and having increased pain

## 2017-06-23 NOTE — Transfer of Care (Signed)
Immediate Anesthesia Transfer of Care Note  Patient: [name]  Procedure(s) Performed: IRRIGATION AND DEBRIDEMENT BUTTOCKS (Right Buttocks)  Patient Location: PACU  Anesthesia Type:General  Level of Consciousness: awake  Airway & Oxygen Therapy: Patient Spontanous Breathing and Patient connected to face mask oxygen  Post-op Assessment: Report given to RN and Post -op Vital signs reviewed and stable  Post vital signs: Reviewed  Last Vitals:  Vitals Value Taken Time  BP 130/52 06/23/2017 10:12 PM  Temp    Pulse 93 06/23/2017 10:12 PM  Resp 18 06/23/2017 10:12 PM  SpO2 100 % 06/23/2017 10:12 PM    Last Pain:  Vitals:   06/23/17 1803  TempSrc:   PainSc: 2          Complications: No apparent anesthesia complications

## 2017-06-23 NOTE — H&P (Signed)
Anthony Greene is an 67 y.o. male.    Chief Complaint: Right buttock pain  HPI: This patient experienced a fall on 21 May and then subsequently developed increasing pain but no fevers or chills.  He was seen in the ER 4 days ago where an incision and drainage of what was called a "sebaceous cyst" was performed since then the patient felt very well right afterwards but has gradually increased pain and size of his right buttock mass.  He is never had an episode like this before but states that he had a very small cyst in the area of the incision and drainage for many many years that never gave him any trouble.  This was the area where the drainage was performed.  He denies fevers or chills.  He has psoriatic arthritis and gets immunotherapy every 6 weeks.  Past Medical History:  Diagnosis Date  . Arthritis     Past Surgical History:  Procedure Laterality Date  . JOINT REPLACEMENT     BL knee    No family history on file. Social History:  reports that he has never smoked. He has never used smokeless tobacco. He reports that he drinks alcohol. He reports that he has current or past drug history.  Allergies:  Allergies  Allergen Reactions  . Vancomycin Other (See Comments)    redman syndrome  . Penicillins Rash     (Not in a hospital admission)   Review of Systems  Constitutional: Negative.   HENT: Negative.   Eyes: Negative.   Respiratory: Negative.   Cardiovascular: Negative.   Gastrointestinal: Negative.   Genitourinary: Negative.   Musculoskeletal: Positive for back pain.  Skin: Negative.   Neurological: Negative.   Endo/Heme/Allergies: Negative.   Psychiatric/Behavioral: Negative.      Physical Exam:  BP 130/80 (BP Location: Left Arm)   Pulse 80   Temp 98.1 F (36.7 C) (Oral)   Resp 18   Ht 5' 10"  (1.778 m)   Wt 215 lb (97.5 kg)   SpO2 97%   BMI 30.85 kg/m   Physical Exam  Constitutional: He is oriented to person, place, and time. He appears  well-developed and well-nourished. No distress.  HENT:  Head: Normocephalic and atraumatic.  Eyes: Pupils are equal, round, and reactive to light. EOM are normal.  Neck: Normal range of motion. Neck supple.  Cardiovascular: Normal rate and regular rhythm.  Pulmonary/Chest: Effort normal. No stridor. No respiratory distress.  Abdominal: Soft. He exhibits no distension.  Musculoskeletal: Normal range of motion. He exhibits tenderness and deformity. He exhibits no edema.  I&D site on the sacrum measuring approximately 6 mm with some surrounding erythema but no purulence.  Market eczema ecchymosis extending from the area of the trochanter on the right to his mid calf.  There is an area of erythema somewhat posterior to the trochanter on the right with a large mass which is minimally tender and somewhat ballotable  Neurological: He is alert and oriented to person, place, and time.  Skin: Skin is warm and dry. He is not diaphoretic. There is erythema.  Psychiatric: He has a normal mood and affect. His behavior is normal.  Vitals reviewed.       Results for orders placed or performed during the hospital encounter of 06/23/17 (from the past 48 hour(s))  Basic metabolic panel     Status: Abnormal   Collection Time: 06/23/17 10:36 AM  Result Value Ref Range   Sodium 134 (L) 135 - 145 mmol/L  Potassium 4.0 3.5 - 5.1 mmol/L   Chloride 101 101 - 111 mmol/L   CO2 24 22 - 32 mmol/L   Glucose, Bld 102 (H) 65 - 99 mg/dL   BUN 9 6 - 20 mg/dL   Creatinine, Ser 0.85 0.61 - 1.24 mg/dL   Calcium 8.6 (L) 8.9 - 10.3 mg/dL   GFR calc non Af Amer >60 >60 mL/min   GFR calc Af Amer >60 >60 mL/min    Comment: (NOTE) The eGFR has been calculated using the CKD EPI equation. This calculation has not been validated in all clinical situations. eGFR's persistently <60 mL/min signify possible Chronic Kidney Disease.    Anion gap 9 5 - 15    Comment: Performed at [facility], [address]  [place], [place] [address]  CBC with Differential     Status: Abnormal   Collection Time: 06/23/17 10:36 AM  Result Value Ref Range   WBC 8.5 3.8 - 10.6 K/uL   RBC 3.73 (L) 4.40 - 5.90 MIL/uL   Hemoglobin 11.8 (L) 13.0 - 18.0 g/dL   HCT 34.4 (L) 40.0 - 52.0 %   MCV 92.3 80.0 - 100.0 fL   MCH 31.6 26.0 - 34.0 pg   MCHC 34.3 32.0 - 36.0 g/dL   RDW 14.3 11.5 - 14.5 %   Platelets 580 (H) 150 - 440 K/uL   Neutrophils Relative % 71 %   Neutro Abs 6.0 1.4 - 6.5 K/uL   Lymphocytes Relative 14 %   Lymphs Abs 1.2 1.0 - 3.6 K/uL   Monocytes Relative 13 %   Monocytes Absolute 1.1 (H) 0.2 - 1.0 K/uL   Eosinophils Relative 1 %   Eosinophils Absolute 0.1 0 - 0.7 K/uL   Basophils Relative 1 %   Basophils Absolute 0.1 0 - 0.1 K/uL    Comment: Performed at [facility], [address] [place], [place] [address]  Urinalysis, Complete w Microscopic     Status: Abnormal   Collection Time: 06/23/17 10:36 AM  Result Value Ref Range   Color, Urine YELLOW (A) YELLOW   APPearance CLEAR (A) CLEAR   Specific Gravity, Urine 1.003 (L) 1.005 - 1.030   pH 7.0 5.0 - 8.0   Glucose, UA NEGATIVE NEGATIVE mg/dL   Hgb urine dipstick NEGATIVE NEGATIVE   Bilirubin Urine NEGATIVE NEGATIVE   Ketones, ur NEGATIVE NEGATIVE mg/dL   Protein, ur NEGATIVE NEGATIVE mg/dL   Nitrite NEGATIVE NEGATIVE   Leukocytes, UA NEGATIVE NEGATIVE   RBC / HPF 0-5 0 - 5 RBC/hpf   WBC, UA 0-5 0 - 5 WBC/hpf   Bacteria, UA NONE SEEN NONE SEEN   Squamous Epithelial / LPF NONE SEEN 0 - 5    Comment: Performed at [facility], [address], [place], [place] [address]   Ct Pelvis W Contrast  Result Date: 06/23/2017 CLINICAL DATA:  Buttocks abscess. The patient had a pilonidal cyst lanced and packed 4 days ago with drainage of a hematoma in that area. Increased swelling and pain in that region. EXAM: CT PELVIS WITH CONTRAST TECHNIQUE: Multidetector CT imaging of the pelvis was performed using the standard protocol following  the bolus administration of intravenous contrast. CONTRAST:  140m OMNIPAQUE IOHEXOL 300 MG/ML  SOLN COMPARISON:  Pelvis radiograph dated 06/06/2017. FINDINGS: Urinary Tract: Small right renal parapelvic cysts. Unremarkable lower pole of the left kidney. The ureters and urinary bladder appear normal. Bowel: Normal appearing appendix and included portions of the colon and small bowel. Vascular/Lymphatic: Atheromatous  arterial calcifications without aneurysm. No enlarged lymph nodes. Reproductive:  Mildly enlarged and calcified prostate gland. Other: Large collection of fluid and gas posterior to the sacrum and extending into or indenting into the posterior aspect of the right gluteus medius muscle this measures 17.4 x 7.5 cm on image number 24 series 2. This measures 15.1 cm in length on coronal image number 95. Musculoskeletal: No bone destruction or periosteal reaction. Lower lumbar spine degenerative changes. Mild bilateral hip degenerative changes. IMPRESSION: 17.4 x 15.1 x 7.5 cm abscess posterior to the sacrum and extending into the posterior aspect of the right buttocks, as described above. Electronically Signed   By: Claudie Revering M.D.   On: 06/23/2017 11:50     Assessment/Plan  This a patient had a fall about 2 weeks ago who has experienced ecchymosis and a likely infected hematoma.  Drainage performed in the ED ED on Sunday is ineffective as his CT scan which is been personally reviewed shows a very large myxoma with gas bubbles and some overlying erythema on exam.  My recommendations are to drain this in the operating room.  The rationale for this was discussed with the patient.  His family was no longer present when I arrived.  I discussed with him the risks of bleeding infection drain placement open wound and recurrence.  Multiple questions were answered for him he understood and agreed to proceed I also mentioned that this would either be done by myself or Dr. Rosana Hoes later on this evening depending  on timing.  Florene Glen, MD, FACS

## 2017-06-23 NOTE — Anesthesia Preprocedure Evaluation (Signed)
Anesthesia Evaluation  Patient identified by MRN, date of birth, ID band Patient awake    Reviewed: Allergy & Precautions, H&P , NPO status , Patient's Chart, lab work & pertinent test results  History of Anesthesia Complications Negative for: history of anesthetic complications  Airway Mallampati: III  TM Distance: <3 FB Neck ROM: full    Dental  (+) Chipped   Pulmonary neg pulmonary ROS, neg shortness of breath,           Cardiovascular Exercise Tolerance: Good (-) angina(-) Past MI and (-) DOE negative cardio ROS       Neuro/Psych negative neurological ROS  negative psych ROS   GI/Hepatic negative GI ROS, neg GERD  ,(+)     substance abuse  alcohol use,   Endo/Other  negative endocrine ROS  Renal/GU      Musculoskeletal  (+) Arthritis ,   Abdominal   Peds  Hematology negative hematology ROS (+)   Anesthesia Other Findings Past Medical History: No date: Arthritis  Past Surgical History: No date: JOINT REPLACEMENT     Comment:  BL knee  BMI    Body Mass Index:  30.85 kg/m      Reproductive/Obstetrics negative OB ROS                             Anesthesia Physical Anesthesia Plan  ASA: II  Anesthesia Plan: General ETT   Post-op Pain Management:    Induction: Intravenous  PONV Risk Score and Plan: Ondansetron, Dexamethasone, Midazolam and Treatment may vary due to age or medical condition  Airway Management Planned: Oral ETT  Additional Equipment:   Intra-op Plan:   Post-operative Plan: Extubation in OR  Informed Consent: I have reviewed the patients History and Physical, chart, labs and discussed the procedure including the risks, benefits and alternatives for the proposed anesthesia with the patient or authorized representative who has indicated his/her understanding and acceptance.   Dental Advisory Given  Plan Discussed with: Anesthesiologist, CRNA and  Surgeon  Anesthesia Plan Comments: (Patient consented for risks of anesthesia including but not limited to:  - adverse reactions to medications - damage to teeth, lips or other oral mucosa - sore throat or hoarseness - Damage to heart, brain, lungs or loss of life  Patient voiced understanding.)        Anesthesia Quick Evaluation

## 2017-06-23 NOTE — Anesthesia Procedure Notes (Signed)
Procedure Name: Intubation Performed by: Rolla Plate, CRNA Pre-anesthesia Checklist: Patient identified, Patient being monitored, Timeout performed, Emergency Drugs available and Suction available Patient Re-evaluated:Patient Re-evaluated prior to induction Oxygen Delivery Method: Circle system utilized Preoxygenation: Pre-oxygenation with 100% oxygen Induction Type: IV induction Ventilation: Mask ventilation without difficulty Laryngoscope Size: Miller and 2 Grade View: Grade I Tube type: Oral Tube size: 7.0 mm Number of attempts: 1 Airway Equipment and Method: Stylet Placement Confirmation: ETT inserted through vocal cords under direct vision,  positive ETCO2 and breath sounds checked- equal and bilateral Secured at: 23 cm Tube secured with: Tape Dental Injury: Teeth and Oropharynx as per pre-operative assessment

## 2017-06-23 NOTE — ED Notes (Signed)
Called to room with complaint of burning and pain at IV site. He felt it was leaking. Cipro infusion turned off. Site intact. Luer lock hub not tight and medication appears to be on his skin. Area cleansed and hub tightened. Pt states he has taken Cipro many times in the past. Call to Dr Burt Knack. Infusion restarted per order. Pt instructed to notify if any further complaints to site.

## 2017-06-23 NOTE — Op Note (Signed)
SURGICAL OPERATIVE REPORT  DATE OF PROCEDURE: 06/23/2017  ATTENDING Surgeon(s): Vickie Epley, MD  ANESTHESIA: GETA  PRE-OPERATIVE DIAGNOSIS: Very large infected post-traumatic Right > Left buttock hematoma (icd-10's: L02.31)  POST-OPERATIVE DIAGNOSIS: Very large infected post-traumatic Right > Left buttock hematoma (icd-10's: L02.31)  PROCEDURE(S):  1.) Excision and drainage of a very large and complex/loculated Right > Left buttock post-traumatic infected hematoma (cpt: 10061)  INTRAOPERATIVE FINDINGS: >400 mL of purulent burgundy-colored fluid under significant pressure from Right > Left buttock  INTRAVENOUS FLUIDS: 300 mL crystalloid   ESTIMATED BLOOD LOSS: 20 mL  URINE OUTPUT: No Foley catheter   SPECIMENS: Contents of very large infected and loculated post-traumatic Right > Left buttock hematoma for culture  IMPLANTS: None  DRAINS: Entire Kerlix rolled gauze into large Right > Left buttock post-traumatic infected hematoma cavity  COMPLICATIONS: None apparent  CONDITION AT END OF PROCEDURE: Hemodynamically stable and extubated  DISPOSITION OF PATIENT: PACU  INDICATIONS FOR PROCEDURE:  Patient is a 67 y.o. male who 4 days ago presented to [facility] ED 8 days s/p a recent fall through the attic floor onto the floor of the room underneath with increasing Right > Left buttock pain, swelling, and erythema, for which patient underwent incision and drainage by ED physician for what was believed at that time to be an infected sebaceous or pilonidal cyst. Patient was accordingly discharged home with a prescription for oral antibiotics at that time, but returned today for worsening of his severe Right > Left buttock pain and swelling with a small amount of drainage. All risks, benefits, and alternatives to incision/excision and drainage of Right > Left buttock abscess (likely an infected post-traumatic hematoma) were discussed  with the patient and his family, all of patient's and his family's questions were answered to their expressed satisfaction, and informed consent was accordingly obtained and documented at that time.  DETAILS OF PROCEDURE: Patient was brought to the operating suite and appropriately identified. General anesthesia was administered along with appropriate pre-operative antibiotics, and endotracheal intubation was performed by anesthetist. With patient laying with his Right side up, operative site was prepped and draped in the usual sterile fashion, and following a brief time out, the Acorn clamp was used to spread the prior small incision and drainage site with immediate release of purulent dark burgundy-colored fluid, consistent with infected hematoma. Deep fluid cavity culture was obtained. This small incision and drainage site was extended using a #15 blade scalpel to facilitate drainage and disruption of multiple intra-cavity septations, releasing additional similar purulent fluid, and the cavity was suctioned using a Yankauer suction. Decision was made at this time to create a 2.5 cm x 1 cm elliptical incision to facilitate both appropriate drainage and to facilitate subsequent wound cavity packing, and the underlying cavity was likewise opened with a corresponding elliptical incision using electrocautery. The wound was then copiously irrigated with warm saline, and multiple laparotomy sponges were packed into the wound and removed relatively more clean without significant gross contamination. Hemostasis was then achieved, local anesthetic was injected, and an entire dry Kerlix rolled gauze was easily packed into the wound. The surrounding skin was then cleaned and dried, and a sterile dry gauze and adhesive dressing was applied. Patient was then safely able to be extubated, awakened, and transferred to PACU for post-operative monitoring and care.  I was present for all aspects of the above procedure, and no  operative complications were apparent.

## 2017-06-23 NOTE — Interval H&P Note (Signed)
History and Physical Interval Note:  06/23/2017 8:51 PM  Anthony Greene  has presented today for surgery, with the diagnosis of infected right buttock  The various methods of treatment have been discussed with the patient and family. After consideration of risks, benefits and other options for treatment, the patient has consented to  Procedure(s): IRRIGATION AND DEBRIDEMENT BUTTOCKS (Right) as a surgical intervention .  The patient's history has been reviewed, patient examined, no change in status, stable for surgery.  I have reviewed the patient's chart and labs.  Questions were answered to the patient's satisfaction.     Vickie Epley

## 2017-06-23 NOTE — ED Notes (Signed)
2C RN called and notified of Cipro and that it is now infusing

## 2017-06-23 NOTE — ED Triage Notes (Signed)
Pt reports he had I/D 4 days ago, pt concerned bc pain has not improved with taking tramadol and vicodin.

## 2017-06-23 NOTE — Anesthesia Post-op Follow-up Note (Signed)
Anesthesia QCDR form completed.        

## 2017-06-24 ENCOUNTER — Encounter: Payer: Self-pay | Admitting: Surgery

## 2017-06-24 DIAGNOSIS — W133XXS Fall through floor, sequela: Secondary | ICD-10-CM | POA: Diagnosis present

## 2017-06-24 DIAGNOSIS — S7001XA Contusion of right hip, initial encounter: Secondary | ICD-10-CM | POA: Diagnosis not present

## 2017-06-24 DIAGNOSIS — S300XXS Contusion of lower back and pelvis, sequela: Secondary | ICD-10-CM | POA: Diagnosis not present

## 2017-06-24 DIAGNOSIS — Z96653 Presence of artificial knee joint, bilateral: Secondary | ICD-10-CM | POA: Diagnosis present

## 2017-06-24 DIAGNOSIS — Z4800 Encounter for change or removal of nonsurgical wound dressing: Secondary | ICD-10-CM | POA: Diagnosis not present

## 2017-06-24 DIAGNOSIS — S300XXA Contusion of lower back and pelvis, initial encounter: Secondary | ICD-10-CM | POA: Diagnosis not present

## 2017-06-24 DIAGNOSIS — L0231 Cutaneous abscess of buttock: Secondary | ICD-10-CM | POA: Diagnosis present

## 2017-06-24 DIAGNOSIS — Z880 Allergy status to penicillin: Secondary | ICD-10-CM | POA: Diagnosis not present

## 2017-06-24 DIAGNOSIS — L4050 Arthropathic psoriasis, unspecified: Secondary | ICD-10-CM | POA: Diagnosis present

## 2017-06-24 DIAGNOSIS — M47816 Spondylosis without myelopathy or radiculopathy, lumbar region: Secondary | ICD-10-CM | POA: Diagnosis present

## 2017-06-24 LAB — HIV ANTIBODY (ROUTINE TESTING W REFLEX): HIV Screen 4th Generation wRfx: NONREACTIVE

## 2017-06-24 MED ORDER — KETOROLAC TROMETHAMINE 15 MG/ML IJ SOLN
15.0000 mg | Freq: Four times a day (QID) | INTRAMUSCULAR | Status: DC
  Administered 2017-06-24 – 2017-06-26 (×10): 15 mg via INTRAVENOUS
  Filled 2017-06-24 (×10): qty 1

## 2017-06-24 MED ORDER — ENOXAPARIN SODIUM 40 MG/0.4ML ~~LOC~~ SOLN
40.0000 mg | SUBCUTANEOUS | Status: DC
  Administered 2017-06-24 – 2017-06-26 (×3): 40 mg via SUBCUTANEOUS
  Filled 2017-06-24 (×3): qty 0.4

## 2017-06-24 MED ORDER — ALUM & MAG HYDROXIDE-SIMETH 200-200-20 MG/5ML PO SUSP
30.0000 mL | ORAL | Status: DC | PRN
  Administered 2017-06-24 – 2017-06-26 (×5): 30 mL via ORAL
  Filled 2017-06-24 (×5): qty 30

## 2017-06-24 NOTE — Progress Notes (Signed)
Patient complained of 2 episodes of "sweating"; pillow and bottom sheet and gown wet; cotton gown given upon request; states that his pain is "OK";  Afebrile overnight; Barbaraann Faster, RN 4:32 AM; 06/24/2017

## 2017-06-24 NOTE — Progress Notes (Signed)
1 Day Post-Op  Subjective: Dr. Shann Medal operative report reviewed.  Patient states he feels much better today than he has in several weeks.  He has it is the first time he is been able to lay on his back.  Objective: Vital signs in last 24 hours: Temp:  [97.8 F (36.6 C)-100.1 F (37.8 C)] 98.3 F (36.8 C) (06/07 0542) Pulse Rate:  [76-93] 76 (06/07 0542) Resp:  [13-20] 16 (06/07 0542) BP: (116-137)/(52-88) 129/75 (06/07 0542) SpO2:  [95 %-100 %] 98 % (06/07 0542) Weight:  [215 lb (97.5 kg)] 215 lb (97.5 kg) (06/06 0927)    Intake/Output from previous day: 06/06 0701 - 06/07 0700 In: 1667 [I.V.:1367; IV Piggyback:300] Out: 570 [Urine:550; Blood:20] Intake/Output this shift: No intake/output data recorded.  Physical exam:  Afebrile with the exception of low-grade temp to 100.1.  Considerable serosanguineous drainage on his dressing.  Kerlix in place minimally tender.  Lab Results: CBC  Recent Labs    06/23/17 1036  WBC 8.5  HGB 11.8*  HCT 34.4*  PLT 580*   BMET Recent Labs    06/23/17 1036  NA 134*  K 4.0  CL 101  CO2 24  GLUCOSE 102*  BUN 9  CREATININE 0.85  CALCIUM 8.6*   PT/INR No results for input(s): LABPROT, INR in the last 72 hours. ABG No results for input(s): PHART, HCO3 in the last 72 hours.  Invalid input(s): PCO2, PO2  Studies/Results: Ct Pelvis W Contrast  Result Date: 06/23/2017 CLINICAL DATA:  Buttocks abscess. The patient had a pilonidal cyst lanced and packed 4 days ago with drainage of a hematoma in that area. Increased swelling and pain in that region. EXAM: CT PELVIS WITH CONTRAST TECHNIQUE: Multidetector CT imaging of the pelvis was performed using the standard protocol following the bolus administration of intravenous contrast. CONTRAST:  198mL OMNIPAQUE IOHEXOL 300 MG/ML  SOLN COMPARISON:  Pelvis radiograph dated 06/06/2017. FINDINGS: Urinary Tract: Small right renal parapelvic cysts. Unremarkable lower pole of the left kidney. The  ureters and urinary bladder appear normal. Bowel: Normal appearing appendix and included portions of the colon and small bowel. Vascular/Lymphatic: Atheromatous arterial calcifications without aneurysm. No enlarged lymph nodes. Reproductive:  Mildly enlarged and calcified prostate gland. Other: Large collection of fluid and gas posterior to the sacrum and extending into or indenting into the posterior aspect of the right gluteus medius muscle this measures 17.4 x 7.5 cm on image number 24 series 2. This measures 15.1 cm in length on coronal image number 95. Musculoskeletal: No bone destruction or periosteal reaction. Lower lumbar spine degenerative changes. Mild bilateral hip degenerative changes. IMPRESSION: 17.4 x 15.1 x 7.5 cm abscess posterior to the sacrum and extending into the posterior aspect of the right buttocks, as described above. Electronically Signed   By: Claudie Revering M.D.   On: 06/23/2017 11:50    Anti-infectives: Anti-infectives (From admission, onward)   Start     Dose/Rate Route Frequency Ordered Stop   06/23/17 1430  ciprofloxacin (CIPRO) IVPB 400 mg     400 mg 200 mL/hr over 60 Minutes Intravenous Every 12 hours 06/23/17 1408     06/23/17 1200  clindamycin (CLEOCIN) IVPB 600 mg     600 mg 100 mL/hr over 30 Minutes Intravenous  Once 06/23/17 1158 06/23/17 1312      Assessment/Plan: s/p Procedure(s): IRRIGATION AND DEBRIDEMENT BUTTOCKS   I discussed with patient the presence of a roll of Kerlix gauze.  I also am waiting on culture results.  My  suggestion would be to keep him in the hospital on IV antibiotics for another day and take him to the operating room tomorrow morning to remove the Kerlix and change it over drains if possible.  He was understanding of this plan and in agreement. I discussed with him the risk of bleeding and infection he understood and agreed with plan  Florene Glen, MD, FACS  06/24/2017

## 2017-06-24 NOTE — Anesthesia Postprocedure Evaluation (Signed)
Anesthesia Post Note  Patient: Anthony Greene  Procedure(s) Performed: IRRIGATION AND DEBRIDEMENT BUTTOCKS (Right Buttocks)  Patient location during evaluation: PACU Anesthesia Type: General Level of consciousness: awake and alert Pain management: pain level controlled Vital Signs Assessment: post-procedure vital signs reviewed and stable Respiratory status: spontaneous breathing, nonlabored ventilation, respiratory function stable and patient connected to nasal cannula oxygen Cardiovascular status: blood pressure returned to baseline and stable Postop Assessment: no apparent nausea or vomiting Anesthetic complications: no     Last Vitals:  Vitals:   06/23/17 2349 06/24/17 0113  BP: 126/63 116/62  Pulse: 88 77  Resp: 18 16  Temp: 36.6 C 36.6 C  SpO2: 98% 95%    Last Pain:  Vitals:   06/24/17 0427  TempSrc:   PainSc: 2                  Precious Haws Sandee Bernath

## 2017-06-24 NOTE — Progress Notes (Signed)
Focused assessment done. Refer to charting. Sacral dressing changed. Consent signed and witnessed. Dr. Burt Knack at bedside prior.

## 2017-06-25 ENCOUNTER — Encounter
Admission: EM | Disposition: A | Discharge status: Home / Self Care | Payer: Self-pay | Source: Home / Self Care | Attending: Surgery

## 2017-06-25 ENCOUNTER — Inpatient Hospital Stay: Payer: Medicare Other | Admitting: Anesthesiology

## 2017-06-25 HISTORY — PX: DRESSING CHANGE UNDER ANESTHESIA: SHX5237

## 2017-06-25 SURGERY — REPLACEMENT, DRESSING, WITH ANESTHESIA
Anesthesia: General

## 2017-06-25 MED ORDER — DEXAMETHASONE SODIUM PHOSPHATE 10 MG/ML IJ SOLN
INTRAMUSCULAR | Status: DC | PRN
  Administered 2017-06-25: 10 mg via INTRAVENOUS

## 2017-06-25 MED ORDER — PROPOFOL 10 MG/ML IV BOLUS
INTRAVENOUS | Status: AC
  Filled 2017-06-25: qty 20

## 2017-06-25 MED ORDER — ONDANSETRON HCL 4 MG/2ML IJ SOLN
INTRAMUSCULAR | Status: DC | PRN
  Administered 2017-06-25: 4 mg via INTRAVENOUS

## 2017-06-25 MED ORDER — FENTANYL CITRATE (PF) 100 MCG/2ML IJ SOLN
INTRAMUSCULAR | Status: DC | PRN
  Administered 2017-06-25 (×2): 50 ug via INTRAVENOUS

## 2017-06-25 MED ORDER — MIDAZOLAM HCL 2 MG/2ML IJ SOLN
INTRAMUSCULAR | Status: AC
  Filled 2017-06-25: qty 2

## 2017-06-25 MED ORDER — MIDAZOLAM HCL 2 MG/2ML IJ SOLN
INTRAMUSCULAR | Status: DC | PRN
  Administered 2017-06-25: 2 mg via INTRAVENOUS

## 2017-06-25 MED ORDER — SUCCINYLCHOLINE CHLORIDE 20 MG/ML IJ SOLN
INTRAMUSCULAR | Status: DC | PRN
  Administered 2017-06-25: 100 mg via INTRAVENOUS

## 2017-06-25 MED ORDER — SUCCINYLCHOLINE CHLORIDE 20 MG/ML IJ SOLN
INTRAMUSCULAR | Status: AC
  Filled 2017-06-25: qty 1

## 2017-06-25 MED ORDER — LIDOCAINE HCL (CARDIAC) PF 100 MG/5ML IV SOSY
PREFILLED_SYRINGE | INTRAVENOUS | Status: DC | PRN
  Administered 2017-06-25: 100 mg via INTRAVENOUS

## 2017-06-25 MED ORDER — HYDROMORPHONE HCL 1 MG/ML IJ SOLN
INTRAMUSCULAR | Status: AC
  Filled 2017-06-25: qty 1

## 2017-06-25 MED ORDER — ONDANSETRON HCL 4 MG/2ML IJ SOLN
INTRAMUSCULAR | Status: AC
Start: 2017-06-25 — End: ?
  Filled 2017-06-25: qty 2

## 2017-06-25 MED ORDER — BACITRACIN ZINC 500 UNIT/GM EX OINT
TOPICAL_OINTMENT | CUTANEOUS | Status: AC
  Filled 2017-06-25: qty 28.35

## 2017-06-25 MED ORDER — LIDOCAINE HCL (PF) 2 % IJ SOLN
INTRAMUSCULAR | Status: AC
  Filled 2017-06-25: qty 10

## 2017-06-25 MED ORDER — ONDANSETRON HCL 4 MG/2ML IJ SOLN: 4.0000 mg | Freq: Once | INTRAMUSCULAR | Status: DC | PRN

## 2017-06-25 MED ORDER — PROPOFOL 10 MG/ML IV BOLUS
INTRAVENOUS | Status: DC | PRN
  Administered 2017-06-25: 150 mg via INTRAVENOUS

## 2017-06-25 MED ORDER — FENTANYL CITRATE (PF) 100 MCG/2ML IJ SOLN
INTRAMUSCULAR | Status: AC
  Filled 2017-06-25: qty 2

## 2017-06-25 MED ORDER — DEXAMETHASONE SODIUM PHOSPHATE 10 MG/ML IJ SOLN
INTRAMUSCULAR | Status: AC
Start: 2017-06-25 — End: ?
  Filled 2017-06-25: qty 1

## 2017-06-25 MED ORDER — HYDROMORPHONE HCL 1 MG/ML IJ SOLN
INTRAMUSCULAR | Status: DC | PRN
  Administered 2017-06-25: 1 mg via INTRAVENOUS

## 2017-06-25 MED ORDER — ROCURONIUM BROMIDE 100 MG/10ML IV SOLN
INTRAVENOUS | Status: DC | PRN
  Administered 2017-06-25: 5 mg via INTRAVENOUS

## 2017-06-25 MED ORDER — FENTANYL CITRATE (PF) 100 MCG/2ML IJ SOLN: 25.0000 ug | INTRAMUSCULAR | Status: DC | PRN

## 2017-06-25 SURGICAL SUPPLY — 14 items
CANISTER SUCT 1200ML W/VALVE (MISCELLANEOUS) ×2 IMPLANT
DRAPE LAPAROTOMY 100X77 ABD (DRAPES) ×2 IMPLANT
GAUZE SPONGE 4X4 12PLY STRL (GAUZE/BANDAGES/DRESSINGS) ×2 IMPLANT
GLOVE BIO SURGEON STRL SZ8 (GLOVE) ×4 IMPLANT
GLOVE INDICATOR 7.5 STRL GRN (GLOVE) ×2 IMPLANT
GLOVE SURG LX 7.5 STRW (GLOVE) ×1
GLOVE SURG LX STRL 7.5 STRW (GLOVE) ×1 IMPLANT
GOWN STRL REUS W/ TWL LRG LVL3 (GOWN DISPOSABLE) ×2 IMPLANT
GOWN STRL REUS W/TWL LRG LVL3 (GOWN DISPOSABLE) ×2
JACKSON PRATT 10 (INSTRUMENTS) ×4 IMPLANT
KIT TURNOVER KIT A (KITS) ×2 IMPLANT
NS IRRIG 1000ML POUR BTL (IV SOLUTION) ×2 IMPLANT
PACK BASIN MINOR ARMC (MISCELLANEOUS) ×2 IMPLANT
WND VAC CANISTER 500ML (MISCELLANEOUS) IMPLANT

## 2017-06-25 NOTE — Anesthesia Postprocedure Evaluation (Signed)
Anesthesia Post Note  Patient: Anthony Greene  Procedure(s) Performed: DRESSING CHANGE UNDER ANESTHESIA (N/A )  Patient location during evaluation: PACU Anesthesia Type: General Level of consciousness: awake and alert Pain management: pain level controlled Vital Signs Assessment: post-procedure vital signs reviewed and stable Respiratory status: spontaneous breathing and respiratory function stable Cardiovascular status: stable Anesthetic complications: no     Last Vitals:  Vitals:   06/25/17 1033 06/25/17 1049  BP: 127/83 129/78  Pulse: 73 73  Resp: 11 17  Temp: 36.4 C   SpO2: 100%     Last Pain:  Vitals:   06/25/17 1049  TempSrc:   PainSc: 2                  KEPHART,WILLIAM K

## 2017-06-25 NOTE — Anesthesia Post-op Follow-up Note (Signed)
Anesthesia QCDR form completed.        

## 2017-06-25 NOTE — Progress Notes (Signed)
15 minute call to floor. 

## 2017-06-25 NOTE — Anesthesia Procedure Notes (Signed)
Procedure Name: Intubation Date/Time: 06/25/2017 9:37 AM Performed by: Johnna Acosta, CRNA Pre-anesthesia Checklist: Patient identified, Emergency Drugs available, Suction available, Patient being monitored and Timeout performed Patient Re-evaluated:Patient Re-evaluated prior to induction Oxygen Delivery Method: Circle system utilized Preoxygenation: Pre-oxygenation with 100% oxygen Induction Type: IV induction Ventilation: Mask ventilation without difficulty Laryngoscope Size: Miller, 2, Mac and 3 Grade View: Grade II Tube type: Oral Tube size: 7.5 mm Number of attempts: 2 Airway Equipment and Method: Stylet Placement Confirmation: ETT inserted through vocal cords under direct vision,  positive ETCO2 and breath sounds checked- equal and bilateral Secured at: 21 cm Tube secured with: Tape Dental Injury: Teeth and Oropharynx as per pre-operative assessment

## 2017-06-25 NOTE — Anesthesia Preprocedure Evaluation (Signed)
Anesthesia Evaluation  Patient identified by MRN, date of birth, ID band Patient awake    Reviewed: Allergy & Precautions, H&P , NPO status , Patient's Chart, lab work & pertinent test results  History of Anesthesia Complications Negative for: history of anesthetic complications  Airway Mallampati: III  TM Distance: <3 FB Neck ROM: full    Dental  (+) Chipped   Pulmonary neg pulmonary ROS, neg shortness of breath,           Cardiovascular Exercise Tolerance: Good (-) angina(-) Past MI and (-) DOE negative cardio ROS       Neuro/Psych negative neurological ROS  negative psych ROS   GI/Hepatic negative GI ROS, neg GERD  ,(+)     substance abuse  alcohol use,   Endo/Other  negative endocrine ROS  Renal/GU      Musculoskeletal  (+) Arthritis ,   Abdominal   Peds  Hematology negative hematology ROS (+)   Anesthesia Other Findings      Reproductive/Obstetrics negative OB ROS                             Anesthesia Physical  Anesthesia Plan  ASA: II and emergent  Anesthesia Plan: General ETT   Post-op Pain Management:    Induction: Intravenous  PONV Risk Score and Plan: 1 and Ondansetron and Dexamethasone  Airway Management Planned: Oral ETT  Additional Equipment:   Intra-op Plan:   Post-operative Plan: Extubation in OR  Informed Consent: I have reviewed the patients History and Physical, chart, labs and discussed the procedure including the risks, benefits and alternatives for the proposed anesthesia with the patient or authorized representative who has indicated his/her understanding and acceptance.     Plan Discussed with: Anesthesiologist, CRNA and Surgeon  Anesthesia Plan Comments:         Anesthesia Quick Evaluation

## 2017-06-25 NOTE — Progress Notes (Signed)
Patient surgery delayed this morning due to emergency surgery requiring OR time.  Discussed this with the patient.  His wife is an OR nurse retired and is completely understanding of this.  Discussed with patient and wife the plan and the rationale for offering this surgical intervention.  The potential for removing the Kerlix and placing drains was the reviewed and they understood and agreed with this plan

## 2017-06-25 NOTE — Transfer of Care (Signed)
Immediate Anesthesia Transfer of Care Note  Patient: Anthony Greene  Procedure(s) Performed: DRESSING CHANGE UNDER ANESTHESIA (N/A )  Patient Location: PACU  Anesthesia Type:General  Level of Consciousness: awake, alert  and oriented  Airway & Oxygen Therapy: Patient Spontanous Breathing and Patient connected to face mask oxygen  Post-op Assessment: Report given to RN and Post -op Vital signs reviewed and stable  Post vital signs: Reviewed and stable  Last Vitals:  Vitals Value Taken Time  BP 127/83 06/25/2017 10:33 AM  Temp 36.4 C 06/25/2017 10:33 AM  Pulse 74 06/25/2017 10:34 AM  Resp 15 06/25/2017 10:34 AM  SpO2 100 % 06/25/2017 10:34 AM  Vitals shown include unvalidated device data.  Last Pain:  Vitals:   06/25/17 0559  TempSrc:   PainSc: Asleep      Patients Stated Pain Goal: 0 (99/24/26 8341)  Complications: No apparent anesthesia complications

## 2017-06-25 NOTE — Discharge Instructions (Signed)
May shower Dressings as needed Routine drain care, record outputs Follow up Dr Burt Knack Thursday

## 2017-06-25 NOTE — Op Note (Signed)
06/25/2017  10:40 AM  PATIENT:  Anthony Greene  67 y.o. male  PRE-OPERATIVE DIAGNOSIS: Buttock hematoma  POST-OPERATIVE DIAGNOSIS: Same  PROCEDURE: Examination under anesthesia, drain placement and dressing change  SURGEON:  Florene Glen MD, FACS  ANESTHESIA:   General with endotracheal tube   Details of Procedure: This a patient with a large buttock hematoma which was drained 2 days ago and packed with the entire length of a Kerlix gauze wrap.  He is here for elective examination and dressing change under anesthesia and possible drain placement.  Preoperatively we discussed the rationale for surgery the options of observation the risk of bleeding recurrent infection and drain placement they understood and agreed to proceed.  Findings foul odor and purulent material on the removed Kerlix dressing.  Cavity extended from the midline wound 15 cm at least to the right and at least 12 cm to the left and 12 cm caudad down the right buttock.  Granulation tissue was becoming apparent.  Description of procedure patient was induced to general anesthesia placed in a well-padded prone position.  A surgical pause was held and then the dressing was removed.  A foul odor emanated from the Kerlix.  The patient was then prepped and draped in a sterile fashion.  Irrigation and inspection of the wound cavity demonstrated the above large dimensions of this cavity.  With these large dimensions and the location of this it was decided to forego any further packing and to place drains.  The lateral extent on the right side was determined and 2 separate incisions were made and 2 separate 10 mm JP drains were placed.  The more cephalad drain exit site is the drain that extends all the way across the midline to the left buttock.  The caudad drain at its exit site extends to the midline and then caudad towards the gravity dependent section of the right buttock.  The drains were held in with 3-0 nylon.  The wound was  then closed in multiple layers utilizing 2 layers of 2-0 Vicryl followed by a 3-0 Vicryl and then the skin edges were approximated with interrupted horizontal mattress sutures of 2-0 nylon and simple sutures of 2-0 nylon.  A sterile dressing was placed with bacitracin.  Patient tolerated the procedure well there were no complications he was taken to the recovery room in stable condition to be admitted for continued care of the drains were placed to bulb suction.  Sponge lap needle count was correct   Florene Glen, MD FACS

## 2017-06-26 ENCOUNTER — Encounter: Payer: Self-pay | Admitting: Surgery

## 2017-06-26 MED ORDER — HYDROCODONE-ACETAMINOPHEN 5-325 MG PO TABS: 1.0000 | ORAL_TABLET | ORAL | 0 refills | Status: AC | PRN

## 2017-06-26 MED ORDER — CIPROFLOXACIN HCL 500 MG PO TABS: 500.0000 mg | ORAL_TABLET | Freq: Two times a day (BID) | ORAL | 0 refills | Status: DC

## 2017-06-26 NOTE — Progress Notes (Signed)
Discharge teaching given to patient, patient verbalized understanding and had no questions. Patient IV removed. Patient will be transported home by family. All patient belongings gathered prior to leaving.  

## 2017-06-26 NOTE — Discharge Summary (Signed)
Physician Discharge Summary  Patient ID: Anthony Greene MRN: [id number] DOB/AGE: 68-May-1952 68 y.o.  Admit date: 06/23/2017 Discharge date: 06/26/2017  Admission Diagnoses: Hematoma of hip  Discharge Diagnoses:  Active Problems:   Hematoma of hip, right, initial encounter   Discharged Condition: good  Hospital Course: Patient underwent incision and drainage of infected hematoma. Recovered well. Taken back to OR for dressing changes and drain placement. Tolerated procedure well and recovering properly. Today not having significant pain and drain with serosanguinolent output.   Consults: None  Significant Diagnostic Studies:   CT PELVIS WITH CONTRAST  TECHNIQUE: Multidetector CT imaging of the pelvis was performed using the standard protocol following the bolus administration of intravenous contrast.  CONTRAST:  123mL OMNIPAQUE IOHEXOL 300 MG/ML  SOLN  COMPARISON:  Pelvis radiograph dated 06/06/2017.  FINDINGS: Urinary Tract: Small right renal parapelvic cysts. Unremarkable lower pole of the left kidney. The ureters and urinary bladder appear normal.  Bowel: Normal appearing appendix and included portions of the colon and small bowel.  Vascular/Lymphatic: Atheromatous arterial calcifications without aneurysm. No enlarged lymph nodes.  Reproductive:  Mildly enlarged and calcified prostate gland.  Other: Large collection of fluid and gas posterior to the sacrum and extending into or indenting into the posterior aspect of the right gluteus medius muscle this measures 17.4 x 7.5 cm on image number 24 series 2. This measures 15.1 cm in length on coronal image number 95.  Musculoskeletal: No bone destruction or periosteal reaction. Lower lumbar spine degenerative changes. Mild bilateral hip degenerative changes.  IMPRESSION: 17.4 x 15.1 x 7.5 cm abscess posterior to the sacrum and extending into the posterior aspect of the right buttocks, as described  above.  Electronically Signed   By: Claudie Revering M.D.   On: 06/23/2017   Treatments: antibiotics: Cipro and surgery: incision and drainage of hematoma. Exam under anesthesia with dressing change and drain placement.   Discharge Exam: Blood pressure 131/72, pulse 69, temperature 98.2 F (36.8 C), temperature source Oral, resp. rate 18, height 5\' 10"  (1.778 m), weight 97.5 kg (215 lb), SpO2 96 %. General appearance: alert and cooperative Resp: clear to auscultation bilaterally Cardio: regular rate and rhythm, S1, S2 normal, no murmur, click, rub or gallop Incision/Wound:Lower back incisions are dry and clean with drain tube in place.   Disposition: Discharge disposition: 01-Home or Self Care       Discharge Instructions    Diet - low sodium heart healthy   Complete by:  As directed    Increase activity slowly   Complete by:  As directed      Allergies as of 06/26/2017      Reactions   Vancomycin Other (See Comments)   redman syndrome   Penicillins Rash   Has patient had a PCN reaction causing immediate rash, facial/tongue/throat swelling, SOB or lightheadedness with hypotension: Yes Has patient had a PCN reaction causing severe rash involving mucus membranes or skin necrosis: No Has patient had a PCN reaction that required hospitalization: No Has patient had a PCN reaction occurring within the last 10 years: No If all of the above answers are "NO", then may proceed with Cephalosporin use.      Medication List    TAKE these medications   celecoxib 200 MG capsule Commonly known as:  CELEBREX Take 200 mg by mouth every morning.   ciprofloxacin 500 MG tablet Commonly known as:  CIPRO Take 1 tablet (500 mg total) by mouth 2 (two) times daily for 7 days.   clindamycin  300 MG capsule Commonly known as:  CLEOCIN Take 1 capsule (300 mg total) by mouth 3 (three) times daily for 10 days.   cyclobenzaprine 5 MG tablet Commonly known as:  FLEXERIL Take 5 mg by mouth at  bedtime.   HYDROcodone-acetaminophen 5-325 MG tablet Commonly known as:  NORCO/VICODIN Take 1 tablet by mouth at bedtime. What changed:  Another medication with the same name was added. Make sure you understand how and when to take each.   HYDROcodone-acetaminophen 5-325 MG tablet Commonly known as:  NORCO Take 1 tablet by mouth every 4 (four) hours as needed for up to 3 days for moderate pain. What changed:  You were already taking a medication with the same name, and this prescription was added. Make sure you understand how and when to take each.   psyllium 95 % Pack Commonly known as:  HYDROCIL/METAMUCIL Take 1 packet by mouth 2 (two) times daily.   ranitidine 150 MG tablet Commonly known as:  ZANTAC Take 150 mg by mouth 2 (two) times daily.   traMADol 50 MG tablet Commonly known as:  ULTRAM Take 50 mg by mouth 2 (two) times daily.      Follow-up Information    Florene Glen, MD. Schedule an appointment as soon as possible for a visit in 5 day(s).   Specialty:  General Surgery Why:  For wound re-check, Thursday Contact information: [place] [address] [phone]           Signed: Herbert Pun 06/26/2017, 9:50 AM

## 2017-06-29 ENCOUNTER — Encounter: Payer: Self-pay | Admitting: Surgery

## 2017-06-29 ENCOUNTER — Ambulatory Visit: Payer: Medicare Other | Admitting: Surgery

## 2017-06-29 VITALS — BP 102/82 | HR 76 | Temp 97.6°F | Ht 70.0 in | Wt 214.0 lb

## 2017-06-29 DIAGNOSIS — S300XXD Contusion of lower back and pelvis, subsequent encounter: Secondary | ICD-10-CM

## 2017-06-29 MED ORDER — TRAMADOL HCL 50 MG PO TABS: 50.0000 mg | ORAL_TABLET | Freq: Four times a day (QID) | ORAL | 0 refills | Status: DC | PRN

## 2017-06-29 MED ORDER — CLINDAMYCIN HCL 150 MG PO CAPS: 150.0000 mg | ORAL_CAPSULE | Freq: Three times a day (TID) | ORAL | 0 refills | Status: AC

## 2017-06-29 MED ORDER — CIPROFLOXACIN HCL 500 MG PO TABS: 500.0000 mg | ORAL_TABLET | Freq: Two times a day (BID) | ORAL | 0 refills | Status: AC

## 2017-06-29 NOTE — Patient Instructions (Signed)
Continue to record the drainage.  You may travel.  Please see your follow up appointment listed below.  Please finish all antibiotics.

## 2017-06-29 NOTE — Progress Notes (Signed)
Outpatient postop visit  06/29/2017  Anthony Greene is an 67 y.o. male.    Procedure: I&D of an infected buttock hematoma  CC: Minimal pain  HPI: This a patient once experienced an infected buttock hematoma.  He was taken the operating room where drainage was performed and Kerlix dressing was placed with packing.  He was taken back to the operating room the next day for packing removal and placement of drains.  He has 2 JP drains in place.  There putting out 75 to 100 cc each and a 24-hour.  Denies fevers or chills.  Medications reviewed.    Physical Exam:  There were no vitals taken for this visit.    PE: No erythema serous drainage only no ecchymosis the midline wound is healing well without erythema sutures are still in place.    Assessment/Plan:  Cultures were negative in spite of mixed flora on Gram stain. This patient status post a fall with a hematoma that became infected.  The cultures are negative so far but he remains on Cipro and Clinda.  The drainage output is considerable and because of the nature of this huge cavity I would not recommend removing the drains too quickly.  I have asked him to come back to see 1 of my partners next week for a wound check only.  At that time I doubt that drains would be amenable to removal unless they were well below 15 cc/day.  I would not remove his sutures at that time either yet because if this midline wound breaks open to the suction canisters would no longer be effective.  So next week will be a wound check only and then he will see me again the following week and I would consider drain removal and suture removal.  He will continue on Cipro Clinda and we will refill his tramadol as needed  Florene Glen, MD, FACS

## 2017-06-30 LAB — AEROBIC/ANAEROBIC CULTURE W GRAM STAIN (SURGICAL/DEEP WOUND)

## 2017-06-30 LAB — AEROBIC CULTURE W GRAM STAIN (SUPERFICIAL SPECIMEN): Special Requests: NORMAL

## 2017-06-30 LAB — AEROBIC/ANAEROBIC CULTURE (SURGICAL/DEEP WOUND)

## 2017-06-30 LAB — AEROBIC CULTURE  (SUPERFICIAL SPECIMEN)

## 2017-07-03 DIAGNOSIS — L0231 Cutaneous abscess of buttock: Secondary | ICD-10-CM

## 2017-07-07 ENCOUNTER — Encounter: Payer: Self-pay | Admitting: Surgery

## 2017-07-07 ENCOUNTER — Ambulatory Visit (INDEPENDENT_AMBULATORY_CARE_PROVIDER_SITE_OTHER): Payer: Medicare Other | Admitting: Surgery

## 2017-07-07 VITALS — BP 152/81 | HR 94 | Temp 98.0°F | Wt 206.0 lb

## 2017-07-07 DIAGNOSIS — Z09 Encounter for follow-up examination after completed treatment for conditions other than malignant neoplasm: Secondary | ICD-10-CM

## 2017-07-07 NOTE — Progress Notes (Signed)
07/07/2017  HPI: Patient is status post incision and drainage of a buttock hematoma on 6/6 with subsequent exam under anesthesia and drain placement with wound closure on 6/8 by Dr. Burt Knack.  He presents today for wound check.  He has been doing well and reports that he no longer has been needing narcotic pain medication.  Drains have become serous and output and continues to be decreased compared to last week.  Denies any fevers or chills, any worsening pain, nausea, vomiting  Vital signs: BP (!) 152/81   Pulse 94   Temp 98 F (36.7 C) (Oral)   Wt 206 lb (93.4 kg)   BMI 29.56 kg/m    Physical Exam: Constitutional: No acute distress Skin: Incision site in the low back/upper buttocks area is clean and dry with sutures in place.  There is no erythema or drainage for any evidence of infection.  Patient has 2 lateral JP drains on the right side which have serous fluid in the bulbs with no evidence of pus or infection.  Dry gauze dressing and tape was reapplied.  Assessment/Plan: 67 year old male status post incision and drainage of buttock Thoma with subsequent closure and drain placement.  Reassured the patient that he continues to heal well and everything is going as expected.  There is no evidence of any worsening infection.  Patient will follow-up with Dr. Burt Knack next week for drain removal and possible suture removal.   Melvyn Neth, MD [facility]

## 2017-07-11 ENCOUNTER — Ambulatory Visit (INDEPENDENT_AMBULATORY_CARE_PROVIDER_SITE_OTHER): Payer: Medicare Other | Admitting: Family Medicine

## 2017-07-11 ENCOUNTER — Encounter: Payer: Self-pay | Admitting: Family Medicine

## 2017-07-11 VITALS — BP 142/80 | HR 96 | Ht 70.0 in | Wt 209.1 lb

## 2017-07-11 DIAGNOSIS — M545 Low back pain: Secondary | ICD-10-CM | POA: Diagnosis not present

## 2017-07-11 DIAGNOSIS — L4050 Arthropathic psoriasis, unspecified: Secondary | ICD-10-CM | POA: Diagnosis not present

## 2017-07-11 DIAGNOSIS — R030 Elevated blood-pressure reading, without diagnosis of hypertension: Secondary | ICD-10-CM | POA: Insufficient documentation

## 2017-07-11 DIAGNOSIS — M47816 Spondylosis without myelopathy or radiculopathy, lumbar region: Secondary | ICD-10-CM | POA: Diagnosis not present

## 2017-07-11 NOTE — Progress Notes (Signed)
Subjective:  Patient ID: [name], male    DOB: [birth year]  Age: 67 y.o. MRN: [id number]  CC: Establish Care   HPI Darell Saputo presents for establishment of care.  He has a history of psoriatic arthritis that have been managed with Enbrel.  He is currently recovering from a fracture of the transverse process of the lumbar vertebra that is healing well.  He had developed a hematoma that became infected.  He is being treated with drains and oral antibiotics.  He has no history of hypertension.  He quit smoking cigars 5 to 6 years ago.  Before his injury he would drink 1-3 alcoholic drinks daily.  He has no history of drug use.  He is retired.  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  He has moved to this area with his wife to be close to their daughter who has 3 children.  He is scheduled for a physical exam in October.  History Nakoa has a past medical history of Arthritis, Closed fracture of transverse process of lumbar vertebra ([place]) (06/06/2017), Hematoma of hip, right, initial encounter (06/23/2017), Injury resulting from fall from height (06/07/2017), Intractable low back pain (06/07/2017), and Pilonidal abscess (06/22/2017).   He has a past surgical history that includes Joint replacement; Irrigation and debridement buttocks (Right, 06/23/2017); and Dressing change under anesthesia (N/A, 06/25/2017).   His family history includes Asthma in his mother; COPD in his mother; Cancer in his brother; Stroke in his father.He reports that he has quit smoking. His smoking use included cigars. He has never used smokeless tobacco. He reports that he drinks alcohol. He reports that he does not use drugs.  Outpatient Medications Prior to Visit  Medication Sig Dispense Refill  . celecoxib (CELEBREX) 200 MG capsule Take 200 mg by mouth every morning.     . ciprofloxacin (CIPRO) 500 MG tablet Take 500 mg by mouth 2 (two) times daily.    . clindamycin (CLEOCIN) 150 MG capsule  Take 150 mg by mouth 3 (three) times daily.    . cyclobenzaprine (FLEXERIL) 5 MG tablet Take 5 mg by mouth daily as needed for muscle spasms.    . Melatonin 10 MG TABS Take 1 tablet by mouth daily.    . naproxen (NAPROSYN) 500 MG tablet Take 500 mg by mouth as needed.    . ranitidine (ZANTAC) 150 MG tablet Take 150 mg by mouth 2 (two) times daily.    Marland Kitchen ketorolac (TORADOL) 10 MG tablet TK 1 T PO  Q 8 H FOR 2 DAYS  0  . naproxen (NAPROSYN) 250 MG tablet Take 250 mg by mouth as needed.    . psyllium (HYDROCIL/METAMUCIL) 95 % PACK Take 1 packet by mouth 2 (two) times daily.    . traMADol (ULTRAM) 50 MG tablet Take 1 tablet (50 mg total) by mouth every 6 (six) hours as needed. 25 tablet 0   No facility-administered medications prior to visit.     ROS Review of Systems  Constitutional: Negative.   HENT: Negative.   Eyes: Negative.   Respiratory: Negative.   Cardiovascular: Negative.   Gastrointestinal: Negative.   Genitourinary: Negative.   Musculoskeletal: Negative for back pain.  Neurological: Negative for weakness and numbness.  Psychiatric/Behavioral: Negative.     Objective:  BP (!) 142/80   Pulse 96   Ht 5\' 10"  (1.778 m)   Wt 209 lb 2 oz (94.9 kg)   SpO2 99%   BMI 30.01 kg/m  Physical Exam  Constitutional: He is oriented to person, place, and time. He appears well-developed and well-nourished. No distress.  HENT:  Head: Normocephalic and atraumatic.  Right Ear: External ear normal.  Left Ear: External ear normal.  Eyes: Right eye exhibits no discharge. Left eye exhibits no discharge.  Neck: No JVD present. No tracheal deviation present.  Pulmonary/Chest: Effort normal.  Neurological: He is alert and oriented to person, place, and time.  Skin: Skin is warm and dry. He is not diaphoretic.     Psychiatric: He has a normal mood and affect. His behavior is normal.      Assessment & Plan:   Deloy was seen today for establish care.  Diagnoses and all orders for this  visit:  Psoriatic arthritis ([place])   I have discontinued Zymeir Demattia's psyllium, ketorolac, and traMADol. I am also having him maintain his celecoxib, ranitidine, clindamycin, ciprofloxacin, cyclobenzaprine, Melatonin, and naproxen.  No orders of the defined types were placed in this encounter.  Will return in October for a physical exam.  Follow-up: No follow-ups on file.  Libby Maw, MD

## 2017-07-13 ENCOUNTER — Encounter: Payer: Self-pay | Admitting: Surgery

## 2017-07-13 ENCOUNTER — Ambulatory Visit (INDEPENDENT_AMBULATORY_CARE_PROVIDER_SITE_OTHER): Payer: Medicare Other | Admitting: Surgery

## 2017-07-13 VITALS — BP 132/84 | HR 86 | Temp 98.1°F | Ht 70.0 in | Wt 209.8 lb

## 2017-07-13 DIAGNOSIS — S300XXD Contusion of lower back and pelvis, subsequent encounter: Secondary | ICD-10-CM

## 2017-07-13 NOTE — Progress Notes (Signed)
Outpatient postop visit  07/13/2017  Anthony Greene is an 67 y.o. male.    Procedure: Drainage of a large hip hematoma  CC: Drains in place  HPI: This patient underwent the drainage of a large infected hip hematoma and subsequent closure over drains.  Minimal pain and is doing well.  He describes minimal drainage less than 30 cc together in 24 hours.  Medications reviewed.    Physical Exam:  There were no vitals taken for this visit.    PE: Serous fluid only and drains.  Wound is healing well with no erythema or drainage.    Assessment/Plan:  This patient underwent the drainage of a large infected hip hematoma and subsequent closure over drains. Sutures were removed from the midline wound.  It is healed well.  Drains are removed as well serous fluid only and dressings are placed. Patient doing very well recommend follow-up on an as-needed basis. Florene Glen, MD, FACS

## 2017-07-13 NOTE — Patient Instructions (Signed)
Please call our office if you have questions or concerns.   

## 2017-07-17 ENCOUNTER — Encounter: Payer: Self-pay | Admitting: Surgery

## 2017-07-18 ENCOUNTER — Telehealth: Payer: Self-pay

## 2017-07-18 ENCOUNTER — Other Ambulatory Visit: Payer: Self-pay

## 2017-07-18 NOTE — Telephone Encounter (Signed)
Spoke with patient to let him know Dr.Cooper gave permission for doxycycline 50 mg BID x 10 days. Called prescription to the below: [facility] [address] [place] [phone]

## 2017-07-22 ENCOUNTER — Telehealth: Payer: Self-pay | Admitting: Surgery

## 2017-07-22 NOTE — Telephone Encounter (Signed)
Patient has called and left a message on 07/22/17-office closed this day. He states that Dr Burt Knack wanted him to follow up in the office when Dr Burt Knack is back in clinic. I see that we did give him an antibiotic on 07/18/17, however it does not say to follow back up. Please call patient back and make an appointment if needed.

## 2017-07-26 ENCOUNTER — Ambulatory Visit: Payer: Self-pay | Admitting: Surgery

## 2017-07-28 ENCOUNTER — Encounter: Payer: Self-pay | Admitting: Family Medicine

## 2017-07-28 ENCOUNTER — Ambulatory Visit (INDEPENDENT_AMBULATORY_CARE_PROVIDER_SITE_OTHER): Payer: Medicare Other | Admitting: Surgery

## 2017-07-28 ENCOUNTER — Encounter: Payer: Self-pay | Admitting: Surgery

## 2017-07-28 VITALS — BP 146/86 | HR 70 | Temp 97.8°F | Ht 70.0 in | Wt 209.2 lb

## 2017-07-28 DIAGNOSIS — S300XXD Contusion of lower back and pelvis, subsequent encounter: Secondary | ICD-10-CM | POA: Diagnosis not present

## 2017-07-28 NOTE — Progress Notes (Signed)
Outpatient Surgical Follow Up  07/28/2017  Anthony Greene is an 67 y.o. male.   CC: Right buttock hematoma  HPI: Patient with a right buttock hematoma which was drained several weeks ago.  He was on vacation and noticed increased swelling and redness and we called in antibiotics.  He is never had any fevers or chills and that redness is gone and he feels much better now.  Past Medical History:  Diagnosis Date  . Arthritis   . Closed fracture of transverse process of lumbar vertebra ([place]) 06/06/2017  . Hematoma of hip, right, initial encounter 06/23/2017  . Injury resulting from fall from height 06/07/2017  . Intractable low back pain 06/07/2017  . Pilonidal abscess 06/22/2017    Past Surgical History:  Procedure Laterality Date  . DRESSING CHANGE UNDER ANESTHESIA N/A 06/25/2017   Procedure: DRESSING CHANGE UNDER ANESTHESIA;  Surgeon: Florene Glen, MD;  Location: [facility] ORS;  Service: General;  Laterality: N/A;  . IRRIGATION AND DEBRIDEMENT BUTTOCKS Right 06/23/2017   Procedure: IRRIGATION AND DEBRIDEMENT BUTTOCKS;  Surgeon: Vickie Epley, MD;  Location: [facility] ORS;  Service: General;  Laterality: Right;  . JOINT REPLACEMENT     BL knee    Family History  Problem Relation Age of Onset  . Asthma Mother   . COPD Mother   . Stroke Father   . Cancer Brother     Social History:  reports that he has quit smoking. His smoking use included cigars. He has never used smokeless tobacco. He reports that he drinks alcohol. He reports that he does not use drugs.  Allergies:  Allergies  Allergen Reactions  . Vancomycin Other (See Comments)    redman syndrome  . Penicillins Rash    Has patient had a PCN reaction causing immediate rash, facial/tongue/throat swelling, SOB or lightheadedness with hypotension: Yes Has patient had a PCN reaction causing severe rash involving mucus membranes or skin necrosis: No Has patient had a PCN reaction that required hospitalization: No Has patient had a PCN  reaction occurring within the last 10 years: No If all of the above answers are "NO", then may proceed with Cephalosporin use.    Medications reviewed.   Review of Systems:   Review of Systems  Constitutional: Negative for chills and fever.  Musculoskeletal: Negative.   Skin: Negative.      Physical Exam:  BP (!) 146/86   Pulse 70   Temp 97.8 F (36.6 C) (Oral)   Ht 5\' 10"  (1.778 m)   Wt 209 lb 3.2 oz (94.9 kg)   BMI 30.02 kg/m   Physical Exam  Vital signs reviewed.  No erythema no drainage soft nontender buttock all wounds are healed without drainage  No results found for this or any previous visit (from the past 48 hour(s)). No results found.  Assessment/Plan:  Patient doing very well no sign of recurrent infection patient will follow-up on an as-needed basis  Florene Glen, MD, FACS

## 2017-07-28 NOTE — Patient Instructions (Signed)
Please call our office if you have questions or concerns.   

## 2017-08-04 DIAGNOSIS — L4050 Arthropathic psoriasis, unspecified: Secondary | ICD-10-CM | POA: Diagnosis not present

## 2017-08-15 ENCOUNTER — Encounter: Payer: Self-pay | Admitting: Surgery

## 2017-08-15 NOTE — Telephone Encounter (Signed)
Please call patient and advise. Patient sent a mychart message this morning.

## 2017-08-16 ENCOUNTER — Encounter: Payer: Self-pay | Admitting: Surgery

## 2017-08-16 ENCOUNTER — Ambulatory Visit (INDEPENDENT_AMBULATORY_CARE_PROVIDER_SITE_OTHER): Payer: Medicare Other | Admitting: Surgery

## 2017-08-16 VITALS — BP 158/83 | HR 76 | Temp 98.3°F | Wt 209.0 lb

## 2017-08-16 DIAGNOSIS — L0591 Pilonidal cyst without abscess: Secondary | ICD-10-CM | POA: Diagnosis not present

## 2017-08-16 NOTE — Progress Notes (Signed)
Surgical Clinic Progress/Follow-up Note   HPI:  67 y.o. Male presents to clinic for follow-up evaluation of sacrococcygeal wound 2 months s/p incision and drainage of large infected post-traumatic Right buttock hematoma that patient says seems to have become infected after ED incision and drainage of an infected chronic pilonidal cyst that subsequently appeared to communicate with his post-traumatic Right buttock hematoma s/p fall. Patient reports a sacro-coccygeal has been present >25 years and was incised and drained 25 years ago for infection. He expresses concern about persistence of his sacral-coccygeal wound, but denies any pain, fever/chills, N/V, CP, or SOB with minimal ongoing non-purulent drainage.  Review of Systems:  Constitutional: denies any other weight loss, fever, chills, or sweats  Eyes: denies any other vision changes, history of eye injury  ENT: denies sore throat, hearing problems  Respiratory: denies shortness of breath, wheezing  Cardiovascular: denies chest pain, palpitations  Gastrointestinal: abdominal pain, N/V, and bowel function as per HPI Musculoskeletal: denies any other joint pains or cramps  Skin: Denies any other rashes or skin discolorations  Neurological: denies any other headache, dizziness, weakness  Psychiatric: denies any other depression, anxiety  All other review of systems: otherwise negative   Vital Signs:  BP (!) 158/83   Pulse 76   Temp 98.3 F (36.8 C) (Oral)   Wt 209 lb (94.8 kg)   BMI 29.99 kg/m    Physical Exam:  Constitutional:  -- Normal body habitus  -- Awake, alert, and oriented x3  Eyes:  -- Pupils equally round and reactive to light  -- No scleral icterus  Ear, nose, throat:  -- No jugular venous distension  -- No nasal drainage, bleeding Pulmonary:  -- No crackles -- Equal breath sounds bilaterally -- Breathing non-labored at rest Cardiovascular:  -- S1, S2 present  -- No pericardial rubs  Gastrointestinal:  --  Soft, nontender, non-distended, no guarding/rebound  -- No abdominal masses appreciated, pulsatile or otherwise  Musculoskeletal / Integumentary:  -- Wounds or skin discoloration: 2 cm vertical sacrococcygeal granulation with expressible cloudy but non-purulent fluid without any surrounding erythema, well-healed drain sites and no Right buttock tenderness or fluctuance at all -- Extremities: B/L UE and LE FROM, hands and feet warm, no edema  Neurologic:  -- Motor function: intact and symmetric  -- Sensation: intact and symmetric   Laboratory studies:  CBC:  Lab Results  Component Value Date   WBC 8.5 06/23/2017   RBC 3.73 (L) 06/23/2017   BMP:  Lab Results  Component Value Date   GLUCOSE 102 (H) 06/23/2017   CO2 24 06/23/2017   BUN 9 06/23/2017   CREATININE 0.85 06/23/2017   CALCIUM 8.6 (L) 06/23/2017     Imaging: No new pertinent imaging studies available for review   Assessment:  67 y.o. yo Male with a problem list including...  Patient Active Problem List   Diagnosis Date Noted  . Elevated BP without diagnosis of hypertension 07/11/2017  . Gluteal abscess   . Hematoma of hip, right, initial encounter 06/23/2017  . Pilonidal abscess 06/22/2017  . Daily consumption of alcohol 06/07/2017  . Injury resulting from fall from height 06/07/2017  . Intractable low back pain 06/07/2017  . Closed fracture of transverse process of lumbar vertebra ([place]) 06/06/2017  . Psoriatic arthritis ([place]) 01/19/1999    presents to clinic for follow-up evaluation of essentially asymptomatic likely chronic pilonidal cyst complicated by recently resolved large infected Right buttock hematoma/abscess s/p incision and drainage with no evidence of infected pilonidal cyst or  recurrent / persistent Right buttock abscess, otherwise doing well.  Plan:   - signs and symptoms of infection discussed  - currently no indication for urgent surgical intervention  - wound care reviewed and application of dry  gauze once daily + prn advised  - return to clinic as needed if patient wishes to consider excision  - instructed to call office if any questions or concerns  All of the above recommendations were discussed with the patient, and all of patient's questions were answered to his expressed satisfaction.  -- Marilynne Drivers Rosana Hoes, MD, [place]: [facility] - Partnering for exceptional care. Office: [phone]

## 2017-08-16 NOTE — Patient Instructions (Signed)
Please give [place] a call in case you have any questions or concerns. Please apply a dry gauze.

## 2017-08-19 DIAGNOSIS — M2550 Pain in unspecified joint: Secondary | ICD-10-CM | POA: Diagnosis not present

## 2017-08-19 DIAGNOSIS — L4050 Arthropathic psoriasis, unspecified: Secondary | ICD-10-CM | POA: Diagnosis not present

## 2017-08-19 DIAGNOSIS — E663 Overweight: Secondary | ICD-10-CM | POA: Diagnosis not present

## 2017-08-19 DIAGNOSIS — Z6829 Body mass index (BMI) 29.0-29.9, adult: Secondary | ICD-10-CM | POA: Diagnosis not present

## 2017-08-19 DIAGNOSIS — M545 Low back pain: Secondary | ICD-10-CM | POA: Diagnosis not present

## 2017-08-19 DIAGNOSIS — M150 Primary generalized (osteo)arthritis: Secondary | ICD-10-CM | POA: Diagnosis not present

## 2017-08-19 DIAGNOSIS — L409 Psoriasis, unspecified: Secondary | ICD-10-CM | POA: Diagnosis not present

## 2017-08-22 ENCOUNTER — Encounter: Payer: Self-pay | Admitting: Surgery

## 2017-09-13 DIAGNOSIS — L4050 Arthropathic psoriasis, unspecified: Secondary | ICD-10-CM | POA: Diagnosis not present

## 2017-11-01 DIAGNOSIS — Z23 Encounter for immunization: Secondary | ICD-10-CM | POA: Diagnosis not present

## 2017-11-08 DIAGNOSIS — L4050 Arthropathic psoriasis, unspecified: Secondary | ICD-10-CM | POA: Diagnosis not present

## 2017-11-08 DIAGNOSIS — Z79899 Other long term (current) drug therapy: Secondary | ICD-10-CM | POA: Diagnosis not present

## 2017-11-09 ENCOUNTER — Ambulatory Visit: Payer: 59 | Admitting: Family Medicine

## 2017-11-09 ENCOUNTER — Ambulatory Visit: Payer: 59 | Admitting: Behavioral Health

## 2017-12-20 ENCOUNTER — Ambulatory Visit (INDEPENDENT_AMBULATORY_CARE_PROVIDER_SITE_OTHER): Payer: Medicare Other | Admitting: Surgery

## 2017-12-20 ENCOUNTER — Encounter: Payer: Self-pay | Admitting: Surgery

## 2017-12-20 ENCOUNTER — Other Ambulatory Visit: Payer: Self-pay

## 2017-12-20 VITALS — BP 162/88 | HR 80 | Temp 97.7°F | Ht 70.0 in | Wt 193.0 lb

## 2017-12-20 DIAGNOSIS — L0591 Pilonidal cyst without abscess: Secondary | ICD-10-CM | POA: Diagnosis not present

## 2017-12-20 DIAGNOSIS — L723 Sebaceous cyst: Secondary | ICD-10-CM

## 2017-12-20 NOTE — Progress Notes (Addendum)
Subjective:   Anthony Greene is a 67 y.o. male who presents for an Initial Medicare Annual Wellness Visit.  The Patient was informed that the wellness visit is to identify future health risk and educate and initiate measures that can reduce risk for increased disease through the lifespan.   Retired from [employer] large jobs.   Review of Systems    No ROS.  Medicare Wellness Visit. Additional risk factors are reflected in the social history. Cardiac Risk Factors include: advanced age (>68men, >83 women);male gender Sleep patterns: Melatonin 10mg . Wakes 2-3 x to urinate. Sleeps 7 hrs. Home Safety/Smoke Alarms: Feels safe in home. Smoke alarms in place. Lives with wife and grandkids. 2 story home. No issues with stairs.  Male:      [facility]- pt reports last 2-3 yrs. Recall 5 yrs. next     Objective:    Today's Vitals   12/21/17 0923  BP: 140/86  Pulse: 70  SpO2: 98%  Weight: 213 lb 6.4 oz (96.8 kg)  Height: 5\' 10"  (1.778 m)   Body mass index is 30.62 kg/m.  Advanced Directives 12/21/2017 06/23/2017 06/23/2017 06/19/2017  Does Patient Have a Medical Advance Directive? Yes Yes No No  Type of Paramedic of [place];Living will [place];Living will - -  Does patient want to make changes to medical advance directive? - No - Patient declined - -  Copy of [place] in Chart? No - copy requested No - copy requested - -  Would patient like information on creating a medical advance directive? - No - Patient declined No - Patient declined No - Patient declined    Current Medications (verified) Outpatient Encounter Medications as of 12/21/2017  Medication Sig  . celecoxib (CELEBREX) 200 MG capsule Take 200 mg by mouth every morning.   . cyclobenzaprine (FLEXERIL) 5 MG tablet Take 5 mg by mouth daily as needed for muscle spasms.  . Melatonin 10 MG TABS Take 1 tablet by mouth daily.   No facility-administered  encounter medications on file as of 12/21/2017.     Allergies (verified) Vancomycin and Penicillins   History: Past Medical History:  Diagnosis Date  . Arthritis   . Closed fracture of transverse process of lumbar vertebra ([place]) 06/06/2017  . Hematoma of hip, right, initial encounter 06/23/2017  . Injury resulting from fall from height 06/07/2017  . Intractable low back pain 06/07/2017  . Pilonidal abscess 06/22/2017   Past Surgical History:  Procedure Laterality Date  . DRESSING CHANGE UNDER ANESTHESIA N/A 06/25/2017   Procedure: DRESSING CHANGE UNDER ANESTHESIA;  Surgeon: Florene Glen, MD;  Location: [facility] ORS;  Service: General;  Laterality: N/A;  . IRRIGATION AND DEBRIDEMENT BUTTOCKS Right 06/23/2017   Procedure: IRRIGATION AND DEBRIDEMENT BUTTOCKS;  Surgeon: Vickie Epley, MD;  Location: [facility] ORS;  Service: General;  Laterality: Right;  . JOINT REPLACEMENT     BL knee   Family History  Problem Relation Age of Onset  . Asthma Mother   . COPD Mother   . Stroke Father   . Cancer Brother    Social History   Socioeconomic History  . Marital status: Married    Spouse name: Not on file  . Number of children: Not on file  . Years of education: Not on file  . Highest education level: Not on file  Occupational History  . Not on file  Social Needs  . Financial resource strain: Not on file  . Food insecurity:  Worry: Not on file    Inability: Not on file  . Transportation needs:    Medical: Not on file    Non-medical: Not on file  Tobacco Use  . Smoking status: Former Smoker    Types: Cigars  . Smokeless tobacco: Never Used  Substance and Sexual Activity  . Alcohol use: Yes    Comment: 1-3 alcoholic drinks daily  . Drug use: Never  . Sexual activity: Yes  Lifestyle  . Physical activity:    Days per week: Not on file    Minutes per session: Not on file  . Stress: Not on file  Relationships  . Social connections:    Talks on phone: Not on file    Gets together:  Not on file    Attends religious service: Not on file    Active member of club or organization: Not on file    Attends meetings of clubs or organizations: Not on file    Relationship status: Not on file  Other Topics Concern  . Not on file  Social History Narrative  . Not on file    Tobacco Counseling Counseling given: Not Answered   Clinical Intake: Pain : No/denies pain    Activities of Daily Living In your present state of health, do you have any difficulty performing the following activities: 12/21/2017 06/23/2017  Hearing? N -  Vision? N -  Difficulty concentrating or making decisions? N -  Walking or climbing stairs? N -  Dressing or bathing? N -  Doing errands, shopping? N N  Preparing Food and eating ? N -  Using the Toilet? N -  In the past six months, have you accidently leaked urine? N -  Do you have problems with loss of bowel control? N -  Managing your Medications? N -  Managing your Finances? N -  Housekeeping or managing your Housekeeping? N -     Immunizations and Health Maintenance Immunization History  Administered Date(s) Administered  . Influenza Inj Mdck Quad Pf 11/01/2017  . Pneumococcal Polysaccharide-23 11/05/2014   Health Maintenance Due  Topic Date Due  . Hepatitis C Screening  12/03/1950  . TETANUS/TDAP  07/31/1968    Patient Care Team: Libby Maw, MD as PCP - General (Family Medicine)  Indicate any recent Medical Services you may have received from other than [facility] providers in the past year (date may be approximate).     Assessment:   This is a routine wellness examination for Anthony Greene. Physical assessment deferred to PCP.  Hearing/Vision screen  Visual Acuity Screening   Right eye Left eye Both eyes  Without correction:     With correction: 20/25 20/40 20/25   Hearing Screening Comments: Able to hear conversational tones w/o difficulty. No issues reported.  Passed whisper test   Dietary issues and exercise activities  discussed: Current Exercise Habits: Home exercise routine, Type of exercise: walking, Time (Minutes): 30, Frequency (Times/Week): 5, Weekly Exercise (Minutes/Week): 150, Exercise limited by: None identified Diet (meal preparation, eat out, water intake, caffeinated beverages, dairy products, fruits and vegetables): well balanced, on average, 3 meals per day Breakfast: muffin and fruit, coffee  Lunch: leftovers Dinner:  Burgers and fries.(pt states this is not his norm)   Goals    . Maintain current health     Continue to exercise and eat healthy      Depression Screen PHQ 2/9 Scores 12/21/2017  PHQ - 2 Score 0    Fall Risk Fall Risk  12/21/2017 12/20/2017  Falls in the past year? 1 0  Number falls in past yr: 0 -   Cognitive Function: Ad8 score reviewed for issues:  Issues making decisions:no  Less interest in hobbies / activities:no  Repeats questions, stories (family complaining):no  Trouble using ordinary gadgets (microwave, computer, phone):no  Forgets the month or year: no  Mismanaging finances: no  Remembering appts:no  Daily problems with thinking and/or memory:no Ad8 score is=0         Screening Tests Health Maintenance  Topic Date Due  . Hepatitis C Screening  [birth year]  . TETANUS/TDAP  07/31/1968  . COLONOSCOPY  12/21/2025  . INFLUENZA VACCINE  Completed  . PNA vac Low Risk Adult  Completed       Plan:     Please schedule your next medicare wellness visit with me in 1 yr.  Continue to eat heart healthy diet (full of fruits, vegetables, whole grains, lean protein, water--limit salt, fat, and sugar intake) and increase physical activity as tolerated.  Bring a copy of your living will and/or healthcare power of attorney to your next office visit.    I have personally reviewed and noted the following in the patient's chart:   . Medical and social history . Use of alcohol, tobacco or illicit drugs  . Current medications and  supplements . Functional ability and status . Nutritional status . Physical activity . Advanced directives . List of other physicians . Hospitalizations, surgeries, and ER visits in previous 12 months . Vitals . Screenings to include cognitive, depression, and falls . Referrals and appointments  In addition, I have reviewed and discussed with patient certain preventive protocols, quality metrics, and best practice recommendations. A written personalized care plan for preventive services as well as general preventive health recommendations were provided to patient.     Shela Nevin, RN   12/21/2017    Above reviewed and agree.

## 2017-12-20 NOTE — Progress Notes (Signed)
Surgical Clinic Progress/Follow-up Note   HPI:  67 y.o. Male presents to clinic for follow-up evaluation of sacrococcygeal pain. Patient reports he previously underwent excision of his pilonidal cyst 25 years ago, since which he says it hadn't bothered him since until he fell and developed a nearby hematoma. It seems that when his pilonidal cyst shortly thereafter became infection and was drained by ED physician, the large buttock hematoma likewise became infected and required operative management, since which pilonidal cyst has remained intermittently painful. Patient states this sacrococcygeal pain then worsened 1 week ago, since which it drained spontaneously and has since felt better. Patient requests excision of his pilonidal cyst to reduce risk of ongoing associated issues. He reports good hygiene and currently denies any fever/chills, N/V, CP, or SOB.  Review of Systems:  Constitutional: denies any other weight loss, fever, chills, or sweats  Eyes: denies any other vision changes, history of eye injury  ENT: denies sore throat, hearing problems  Respiratory: denies shortness of breath, wheezing  Cardiovascular: denies chest pain, palpitations  Gastrointestinal: denies abdominal pain, N/V, or diarrhea Musculoskeletal: denies any other joint pains or cramps  Skin: Denies any other rashes or skin discolorations except as per interval history Neurological: denies any other headache, dizziness, weakness  Psychiatric: denies any other depression, anxiety  All other review of systems: otherwise negative   Vital Signs:  BP (!) 162/88   Pulse 80   Temp 97.7 F (36.5 C) (Skin)   Ht 5\' 10"  (1.778 m)   Wt 193 lb (87.5 kg)   SpO2 98%   BMI 27.69 kg/m    Physical Exam:  Constitutional:  -- Normal body habitus  -- Awake, alert, and oriented x3  Eyes:  -- Pupils equally round and reactive to light  -- No scleral icterus  Ear, nose, throat:  -- No jugular venous distension  -- No  nasal drainage, bleeding Pulmonary:  -- No crackles -- Equal breath sounds bilaterally -- Breathing non-labored at rest Cardiovascular:  -- S1, S2 present  -- No pericardial rubs  Gastrointestinal:  -- Soft, nontender, non-distended, no guarding/rebound  -- No abdominal masses appreciated, pulsatile or otherwise  Musculoskeletal / Integumentary:  -- Wounds or skin discoloration: Left of midline 2 cm palpable moderately tender to palpation sacral pilonidal cyst without any surrounding erythema or currently appreciable drainage -- Extremities: B/L UE and LE FROM, hands and feet warm, no edema  Neurologic:  -- Motor function: intact and symmetric  -- Sensation: intact and symmetric   Laboratory studies: CBC Latest Ref Rng & Units 06/23/2017  WBC 3.8 - 10.6 K/uL 8.5  Hemoglobin 13.0 - 18.0 g/dL 11.8(L)  Hematocrit 40.0 - 52.0 % 34.4(L)  Platelets 150 - 440 K/uL 580(H)   CMP Latest Ref Rng & Units 06/23/2017  Glucose 65 - 99 mg/dL 102(H)  BUN 6 - 20 mg/dL 9  Creatinine 0.61 - 1.24 mg/dL 0.85  Sodium 135 - 145 mmol/L 134(L)  Potassium 3.5 - 5.1 mmol/L 4.0  Chloride 101 - 111 mmol/L 101  CO2 22 - 32 mmol/L 24  Calcium 8.9 - 10.3 mg/dL 8.6(L)   Imaging: No new pertinent imaging available for review at this time  Assessment:  67 y.o. yo Male with a problem list including...  Patient Active Problem List   Diagnosis Date Noted  . Elevated BP without diagnosis of hypertension 07/11/2017  . Gluteal abscess   . Hematoma of hip, right, initial encounter 06/23/2017  . Pilonidal abscess 06/22/2017  . Daily consumption  of alcohol 06/07/2017  . Injury resulting from fall from height 06/07/2017  . Intractable low back pain 06/07/2017  . Closed fracture of transverse process of lumbar vertebra ([place]) 06/06/2017  . Psoriatic arthritis ([place]) 01/19/1999    presents to clinic for follow-up evaluation of persistently and increasingly symptomatic currently non-infected chronic sacrococcygeal  pilonidal cyst, complicated by comorbidities including HTN, chronic lower back pain, history of alcohol daily consumption, and psoriatic arthritis.  Plan:   - natural history of pilonidal cysts +/- sinus tract(s) discussed  - all risks, benefits, and alternatives to excision of recurrent pilonidal cyst and increasingly symptomatic non-infected Left of mid-upper back sebaceous cyst were discussed with the patient, all of his questions were answered to his expressed satisfaction, patient expresses he wishes to proceed, and informed consent was obtained.  - will plan for elective excision of recurrent pilonidal cyst (without application of negative pressure wound VAC dressing) and increasingly symptomatic non-infected Left of mid-upper back sebaceous cyst pending patient to call to schedule after coordinating with upcoming travel plans  - anticipate return to clinic 2 weeks following above elective procedures  - instructed to call office if any questions or concerns  All of the above recommendations were discussed with the patient, and all of patient's questions were answered to his expressed satisfaction.  -- Marilynne Drivers Rosana Hoes, MD, [place]: [facility] - Partnering for exceptional care. Office: [phone]

## 2017-12-20 NOTE — Patient Instructions (Addendum)
Patient to have a back cyst removed and pilonidal cyst excision @ [facility]. He will call back to scheduled.  . The patient is aware to call back for any questions or concerns.

## 2017-12-21 ENCOUNTER — Encounter: Payer: Self-pay | Admitting: *Deleted

## 2017-12-21 ENCOUNTER — Ambulatory Visit (INDEPENDENT_AMBULATORY_CARE_PROVIDER_SITE_OTHER): Payer: Medicare Other | Admitting: Family Medicine

## 2017-12-21 ENCOUNTER — Ambulatory Visit (INDEPENDENT_AMBULATORY_CARE_PROVIDER_SITE_OTHER): Payer: Medicare Other | Admitting: *Deleted

## 2017-12-21 ENCOUNTER — Encounter: Payer: Self-pay | Admitting: Family Medicine

## 2017-12-21 ENCOUNTER — Encounter: Payer: Self-pay | Admitting: Surgery

## 2017-12-21 VITALS — BP 140/86 | HR 70 | Ht 70.0 in | Wt 213.4 lb

## 2017-12-21 DIAGNOSIS — Z0000 Encounter for general adult medical examination without abnormal findings: Secondary | ICD-10-CM | POA: Insufficient documentation

## 2017-12-21 DIAGNOSIS — H9190 Unspecified hearing loss, unspecified ear: Secondary | ICD-10-CM | POA: Diagnosis not present

## 2017-12-21 DIAGNOSIS — R972 Elevated prostate specific antigen [PSA]: Secondary | ICD-10-CM

## 2017-12-21 DIAGNOSIS — D649 Anemia, unspecified: Secondary | ICD-10-CM

## 2017-12-21 DIAGNOSIS — N401 Enlarged prostate with lower urinary tract symptoms: Secondary | ICD-10-CM

## 2017-12-21 DIAGNOSIS — R3911 Hesitancy of micturition: Secondary | ICD-10-CM | POA: Diagnosis not present

## 2017-12-21 DIAGNOSIS — L989 Disorder of the skin and subcutaneous tissue, unspecified: Secondary | ICD-10-CM | POA: Diagnosis not present

## 2017-12-21 DIAGNOSIS — Z0001 Encounter for general adult medical examination with abnormal findings: Secondary | ICD-10-CM

## 2017-12-21 DIAGNOSIS — H6123 Impacted cerumen, bilateral: Secondary | ICD-10-CM

## 2017-12-21 LAB — URINALYSIS, ROUTINE W REFLEX MICROSCOPIC
Bilirubin Urine: NEGATIVE
Hgb urine dipstick: NEGATIVE
Ketones, ur: NEGATIVE
Leukocytes, UA: NEGATIVE
Nitrite: NEGATIVE
RBC / HPF: NONE SEEN (ref 0–?)
Specific Gravity, Urine: <=1.005 — AB (ref 1.000–1.030)
Total Protein, Urine: NEGATIVE
Urine Glucose: NEGATIVE
Urobilinogen, UA: 0.2 (ref 0.0–1.0)
pH: 7 (ref 5.0–8.0)

## 2017-12-21 MED ORDER — TAMSULOSIN HCL 0.4 MG PO CAPS: 0.4000 mg | ORAL_CAPSULE | Freq: Every day | ORAL | 3 refills | Status: DC

## 2017-12-21 NOTE — Progress Notes (Addendum)
Established Patient Office Visit  Subjective:  Patient ID: Anthony Greene, male    DOB: January 27, 1950  Age: 67 y.o. MRN: [id number]  CC:  Chief Complaint  Patient presents with  . Annual Exam    HPI Anthony Greene presents for a complete physical exam. His immunizations are up to date and he had a colonoscopy two years ago. He will have another colonoscopy in 5 years.  Patient is here for a physical exam today.  He does have some issues that he wants to discuss with me.  For the last couple of years he has had been experiencing a decrease in the force of his stream and nocturia x2-3.  He does sometimes feel that he does not empty his bladder completely.  The symptoms are mostly at night and not so much during the day.  He is able to go for long drives or sit through a movie without being to get up and his bladder.  Patient has noticed a decrease in to have that attended to.  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  He has a history of solar keratosis and would like to see a dermatologist for this.  He is currently seeing a surgeon for follow-up of his pilonidal cyst and an inclusion cyst on his back.  There are upcoming plans to have both removed surgically.  Patient is retired and lives at home with his wife.  He says that he has a full house because his daughter and her family are living in his home until their home is complete.  Patient had developed gastritis on Naprosyn.  Gastritis resolved after discontinuation of Naprosyn.  He is currently on Celebrex daily for his psoriatic arthritis and has been tolerating it well.  He exercises on his elliptical 5 times a week.  He smokes cigarettes in college and cigars through his working years.  Review of the chart shows that a CT scan of his abdomen performed in June of this year failed to show a AAA.  Prostate was mildly enlarged.  Patient does not use illicit drugs.  He drinks up to 2-3 alcoholic beverages daily.  He  scored 0 out of 4 on the cage.    Past Medical History:  Diagnosis Date  . Arthritis   . Closed fracture of transverse process of lumbar vertebra ([place]) 06/06/2017  . Hematoma of hip, right, initial encounter 06/23/2017  . Injury resulting from fall from height 06/07/2017  . Intractable low back pain 06/07/2017  . Pilonidal abscess 06/22/2017    Past Surgical History:  Procedure Laterality Date  . DRESSING CHANGE UNDER ANESTHESIA N/A 06/25/2017   Procedure: DRESSING CHANGE UNDER ANESTHESIA;  Surgeon: Florene Glen, MD;  Location: [facility] ORS;  Service: General;  Laterality: N/A;  . IRRIGATION AND DEBRIDEMENT BUTTOCKS Right 06/23/2017   Procedure: IRRIGATION AND DEBRIDEMENT BUTTOCKS;  Surgeon: Vickie Epley, MD;  Location: [facility] ORS;  Service: General;  Laterality: Right;  . JOINT REPLACEMENT     BL knee    Family History  Problem Relation Age of Onset  . Asthma Mother   . COPD Mother   . Stroke Father   . Cancer Brother     Social History   Socioeconomic History  . Marital status: Married    Spouse name: Not on file  . Number of children: Not on file  . Years of education: Not on file  . Highest education level: Not on file  Occupational History  . Not on file  Social Needs  . Financial resource strain: Not on file  . Food insecurity:    Worry: Not on file    Inability: Not on file  . Transportation needs:    Medical: Not on file    Non-medical: Not on file  Tobacco Use  . Smoking status: Former Smoker    Types: Cigars  . Smokeless tobacco: Never Used  Substance and Sexual Activity  . Alcohol use: Yes    Comment: 1-3 alcoholic drinks daily  . Drug use: Never  . Sexual activity: Yes  Lifestyle  . Physical activity:    Days per week: Not on file    Minutes per session: Not on file  . Stress: Not on file  Relationships  . Social connections:    Talks on phone: Not on file    Gets together: Not on file    Attends religious service: Not on file    Active member  of club or organization: Not on file    Attends meetings of clubs or organizations: Not on file    Relationship status: Not on file  . Intimate partner violence:    Fear of current or ex partner: Not on file    Emotionally abused: Not on file    Physically abused: Not on file    Forced sexual activity: Not on file  Other Topics Concern  . Not on file  Social History Narrative  . Not on file    Outpatient Medications Prior to Visit  Medication Sig Dispense Refill  . celecoxib (CELEBREX) 200 MG capsule Take 200 mg by mouth every morning.     . cyclobenzaprine (FLEXERIL) 5 MG tablet Take 5 mg by mouth daily as needed for muscle spasms.    . Melatonin 10 MG TABS Take 1 tablet by mouth daily.     No facility-administered medications prior to visit.     Allergies  Allergen Reactions  . Vancomycin Other (See Comments)    redman syndrome  . Penicillins Rash    Has patient had a PCN reaction causing immediate rash, facial/tongue/throat swelling, SOB or lightheadedness with hypotension: Yes Has patient had a PCN reaction causing severe rash involving mucus membranes or skin necrosis: No Has patient had a PCN reaction that required hospitalization: No Has patient had a PCN reaction occurring within the last 10 years: No If all of the above answers are "NO", then may proceed with Cephalosporin use.    ROS Review of Systems  Constitutional: Negative for chills, diaphoresis, fatigue, fever and unexpected weight change.  HENT: Negative.   Eyes: Negative for photophobia and visual disturbance.  Respiratory: Negative.   Cardiovascular: Negative.   Gastrointestinal: Negative.   Endocrine: Negative for polyphagia and polyuria.  Genitourinary: Positive for difficulty urinating. Negative for decreased urine volume, frequency, hematuria and urgency.  Musculoskeletal: Negative for gait problem and joint swelling.  Skin: Positive for wound. Negative for pallor.  Allergic/Immunologic: Negative  for immunocompromised state.  Neurological: Negative for tremors, seizures and speech difficulty.  Hematological: Does not bruise/bleed easily.  Psychiatric/Behavioral: Negative.       Objective:    Physical Exam  Constitutional: He is oriented to person, place, and time. He appears well-developed and well-nourished. No distress.  HENT:  Head: Normocephalic and atraumatic.  Right Ear: External ear normal.  Left Ear: External ear normal.  Mouth/Throat: Oropharynx is clear and moist. No oropharyngeal exudate.  Eyes: Pupils are equal, round, and reactive to light.  Conjunctivae are normal. Right eye exhibits no discharge. Left eye exhibits no discharge. No scleral icterus.  Neck: Neck supple. No JVD present. No tracheal deviation present. No thyromegaly present.  Cardiovascular: Normal rate, regular rhythm and normal heart sounds.  Pulmonary/Chest: Effort normal and breath sounds normal. No stridor.  Abdominal: Soft. Bowel sounds are normal. He exhibits no distension. There is no tenderness. There is no rebound and no guarding.  Genitourinary: Rectal exam shows no external hemorrhoid, no internal hemorrhoid, no fissure, no mass, no tenderness, anal tone normal and guaiac negative stool. Prostate is enlarged and tender.  Musculoskeletal: He exhibits no edema.  Lymphadenopathy:    He has no cervical adenopathy.  Neurological: He is alert and oriented to person, place, and time.  Skin: Skin is warm and dry. He is not diaphoretic.  Psychiatric: He has a normal mood and affect. His behavior is normal.    BP 140/86   Pulse 70   Ht 5\' 10"  (1.778 m)   Wt 213 lb 6.4 oz (96.8 kg)   SpO2 98%   BMI 30.62 kg/m  Wt Readings from Last 3 Encounters:  12/21/17 213 lb 6.4 oz (96.8 kg)  12/21/17 213 lb 6.4 oz (96.8 kg)  12/20/17 193 lb (87.5 kg)   BP Readings from Last 3 Encounters:  12/21/17 140/86  12/21/17 140/86  12/20/17 (!) 162/88   Health Maintenance Due  Topic Date Due  . Hepatitis  C Screening  January 24, 1950  . TETANUS/TDAP  07/31/1968    There are no preventive care reminders to display for this patient.  No results found for: TSH Lab Results  Component Value Date   WBC 4.8 12/22/2017   HGB 15.4 12/22/2017   HCT 45.6 12/22/2017   MCV 92.1 12/22/2017   PLT 265.0 12/22/2017   Lab Results  Component Value Date   NA 137 12/22/2017   K 4.4 12/22/2017   CO2 29 12/22/2017   GLUCOSE 93 12/22/2017   BUN 16 12/22/2017   CREATININE 0.99 12/22/2017   BILITOT 0.9 12/22/2017   ALKPHOS 47 12/22/2017   AST 16 12/22/2017   ALT 14 12/22/2017   PROT 6.8 12/22/2017   ALBUMIN 4.3 12/22/2017   CALCIUM 9.5 12/22/2017   ANIONGAP 9 06/23/2017   GFR 79.81 12/22/2017   Lab Results  Component Value Date   CHOL 183 12/22/2017   Lab Results  Component Value Date   HDL 84.00 12/22/2017   Lab Results  Component Value Date   LDLCALC 88 12/22/2017   Lab Results  Component Value Date   TRIG 52.0 12/22/2017   Lab Results  Component Value Date   CHOLHDL 2 12/22/2017   No results found for: HGBA1C    Assessment & Plan:   Problem List Items Addressed This Visit      Nervous and Auditory   Excessive cerumen in both ear canals   Hearing loss   Relevant Orders   Ambulatory referral to ENT     Musculoskeletal and Integument   Skin abnormalities   Relevant Orders   Ambulatory referral to Dermatology     Genitourinary   Benign prostatic hyperplasia with urinary hesitancy   Relevant Medications   tamsulosin (FLOMAX) 0.4 MG CAPS capsule   Other Relevant Orders   Urinalysis, Routine w reflex microscopic (Completed)   Urine Culture (Completed)   CBC (Completed)   PSA (Completed)     Other   Encounter for health maintenance examination with abnormal findings - Primary   Relevant Orders  Comprehensive metabolic panel (Completed)   Lipid panel (Completed)   Anemia   Relevant Orders   CBC (Completed)   Iron, TIBC and Ferritin Panel (Completed)   Elevated  PSA, less than 10 ng/ml      Meds ordered this encounter  Medications  . tamsulosin (FLOMAX) 0.4 MG CAPS capsule    Sig: Take 1 capsule (0.4 mg total) by mouth daily.    Dispense:  30 capsule    Refill:  3    Follow-up: Return in about 1 month (around 01/21/2018).   Trial of Flomax to address urine flow concerns.  Patient was given anticipatory guidance for health maintenance and disease prevention.  He will continue his healthy lifestyle of exercising almost daily.  Encouraged follow-up with ophthalmology for an eye check.  Multiple issues as listed above will be followed up in 1 month.

## 2017-12-21 NOTE — Patient Instructions (Signed)
Health Maintenance, Male A healthy lifestyle and preventive care is important for your health and wellness. Ask your health care provider about what schedule of regular examinations is right for you. What should I know about weight and diet? Eat a Healthy Diet  Eat plenty of vegetables, fruits, whole grains, low-fat dairy products, and lean protein.  Do not eat a lot of foods high in solid fats, added sugars, or salt.  Maintain a Healthy Weight Regular exercise can help you achieve or maintain a healthy weight. You should:  Do at least 150 minutes of exercise each week. The exercise should increase your heart rate and make you sweat (moderate-intensity exercise).  Do strength-training exercises at least twice a week.  Watch Your Levels of Cholesterol and Blood Lipids  Have your blood tested for lipids and cholesterol every 5 years starting at 68 years of age. If you are at high risk for heart disease, you should start having your blood tested when you are 68 years old. You may need to have your cholesterol levels checked more often if: ? Your lipid or cholesterol levels are high. ? You are older than 68 years of age. ? You are at high risk for heart disease.  What should I know about cancer screening? Many types of cancers can be detected early and may often be prevented. Lung Cancer  You should be screened every year for lung cancer if: ? You are a current smoker who has smoked for at least 30 years. ? You are a former smoker who has quit within the past 15 years.  Talk to your health care provider about your screening options, when you should start screening, and how often you should be screened.  Colorectal Cancer  Routine colorectal cancer screening usually begins at 68 years of age and should be repeated every 5-10 years until you are 68 years old. You may need to be screened more often if early forms of precancerous polyps or small growths are found. Your health care provider  may recommend screening at an earlier age if you have risk factors for colon cancer.  Your health care provider may recommend using home test kits to check for hidden blood in the stool.  A small camera at the end of a tube can be used to examine your colon (sigmoidoscopy or colonoscopy). This checks for the earliest forms of colorectal cancer.  Prostate and Testicular Cancer  Depending on your age and overall health, your health care provider may do certain tests to screen for prostate and testicular cancer.  Talk to your health care provider about any symptoms or concerns you have about testicular or prostate cancer.  Skin Cancer  Check your skin from head to toe regularly.  Tell your health care provider about any new moles or changes in moles, especially if: ? There is a change in a mole's size, shape, or color. ? You have a mole that is larger than a pencil eraser.  Always use sunscreen. Apply sunscreen liberally and repeat throughout the day.  Protect yourself by wearing long sleeves, pants, a wide-brimmed hat, and sunglasses when outside.  What should I know about heart disease, diabetes, and high blood pressure?  If you are 18-39 years of age, have your blood pressure checked every 3-5 years. If you are 40 years of age or older, have your blood pressure checked every year. You should have your blood pressure measured twice-once when you are at a hospital or clinic, and once when   you are not at a hospital or clinic. Record the average of the two measurements. To check your blood pressure when you are not at a hospital or clinic, you can use: ? An automated blood pressure machine at a pharmacy. ? A home blood pressure monitor.  Talk to your health care provider about your target blood pressure.  If you are between 11-39 years old, ask your health care provider if you should take aspirin to prevent heart disease.  Have regular diabetes screenings by checking your fasting blood  sugar level. ? If you are at a normal weight and have a low risk for diabetes, have this test once every three years after the age of 74. ? If you are overweight and have a high risk for diabetes, consider being tested at a younger age or more often.  A one-time screening for abdominal aortic aneurysm (AAA) by ultrasound is recommended for men aged 7-75 years who are current or former smokers. What should I know about preventing infection? Hepatitis B If you have a higher risk for hepatitis B, you should be screened for this virus. Talk with your health care provider to find out if you are at risk for hepatitis B infection. Hepatitis C Blood testing is recommended for:  Everyone born from [birth year] through 1966.  Anyone with known risk factors for hepatitis C.  Sexually Transmitted Diseases (STDs)  You should be screened each year for STDs including gonorrhea and chlamydia if: ? You are sexually active and are younger than 68 years of age. ? You are older than 68 years of age and your health care provider tells you that you are at risk for this type of infection. ? Your sexual activity has changed since you were last screened and you are at an increased risk for chlamydia or gonorrhea. Ask your health care provider if you are at risk.  Talk with your health care provider about whether you are at high risk of being infected with HIV. Your health care provider may recommend a prescription medicine to help prevent HIV infection.  What else can I do?  Schedule regular health, dental, and eye exams.  Stay current with your vaccines (immunizations).  Do not use any tobacco products, such as cigarettes, chewing tobacco, and e-cigarettes. If you need help quitting, ask your health care provider.  Limit alcohol intake to no more than 2 drinks per day. One drink equals 12 ounces of beer, 5 ounces of wine, or 1 ounces of hard liquor.  Do not use street drugs.  Do not share needles.  Ask your  health care provider for help if you need support or information about quitting drugs.  Tell your health care provider if you often feel depressed.  Tell your health care provider if you have ever been abused or do not feel safe at home. This information is not intended to replace advice given to you by your health care provider. Make sure you discuss any questions you have with your health care provider. Document Released: 07/03/2007 Document Revised: 09/03/2015 Document Reviewed: 10/08/2014 Elsevier Interactive Patient Education  2018 [employer]  Benign Prostatic Hyperplasia Benign prostatic hyperplasia (BPH) is an enlarged prostate gland that is caused by the normal aging process and not by cancer. The prostate is a walnut-sized gland that is involved in the production of semen. It is located in front of the rectum and below the bladder. The bladder stores urine and the urethra is the tube that carries the urine  out of the body. The prostate may get bigger as a man gets older. An enlarged prostate can press on the urethra. This can make it harder to pass urine. The build-up of urine in the bladder can cause infection. Back pressure and infection may progress to bladder damage and kidney (renal) failure. What are the causes? This condition is part of a normal aging process. However, not all men develop problems from this condition. If the prostate enlarges away from the urethra, urine flow will not be blocked. If it enlarges toward the urethra and compresses it, there will be problems passing urine. What increases the risk? This condition is more likely to develop in men over the age of 80 years. What are the signs or symptoms? Symptoms of this condition include:  Getting up often during the night to urinate.  Needing to urinate frequently during the day.  Difficulty starting urine flow.  Decrease in size and strength of your urine stream.  Leaking (dribbling) after  urinating.  Inability to pass urine. This needs immediate treatment.  Inability to completely empty your bladder.  Pain when you pass urine. This is more common if there is also an infection.  Urinary tract infection (UTI).  How is this diagnosed? This condition is diagnosed based on your medical history, a physical exam, and your symptoms. Tests will also be done, such as:  A post-void bladder scan. This measures any amount of urine that may remain in your bladder after you finish urinating.  A digital rectal exam. In a rectal exam, your health care provider checks your prostate by putting a lubricated, gloved finger into your rectum to feel the back of your prostate gland. This exam detects the size of your gland and any abnormal lumps or growths.  An exam of your urine (urinalysis).  A prostate specific antigen (PSA) screening. This is a blood test used to screen for prostate cancer.  An ultrasound. This test uses sound waves to electronically produce a picture of your prostate gland.  Your health care provider may refer you to a specialist in kidney and prostate diseases (urologist). How is this treated? Once symptoms begin, your health care provider will monitor your condition (active surveillance or watchful waiting). Treatment for this condition will depend on the severity of your condition. Treatment may include:  Observation and yearly exams. This may be the only treatment needed if your condition and symptoms are mild.  Medicines to relieve your symptoms, including: ? Medicines to shrink the prostate. ? Medicines to relax the muscle of the prostate.  Surgery in severe cases. Surgery may include: ? Prostatectomy. In this procedure, the prostate tissue is removed completely through an open incision or with a laparascope or robotics. ? Transurethral resection of the prostate (TURP). In this procedure, a tool is inserted through the opening at the tip of the penis (urethra).  It is used to cut away tissue of the inner core of the prostate. The pieces are removed through the same opening of the penis. This removes the blockage. ? Transurethral incision (TUIP). In this procedure, small cuts are made in the prostate. This lessens the prostate's pressure on the urethra. ? Transurethral microwave thermotherapy (TUMT). This procedure uses microwaves to create heat. The heat destroys and removes a small amount of prostate tissue. ? Transurethral needle ablation (TUNA). This procedure uses radio frequencies to destroy and remove a small amount of prostate tissue. ? Interstitial laser coagulation ([facility]ruger). This procedure uses a laser to destroy and  remove a small amount of prostate tissue. ? Transurethral electrovaporization (TUVP). This procedure uses electrodes to destroy and remove a small amount of prostate tissue. ? Prostatic urethral lift. This procedure inserts an implant to push the lobes of the prostate away from the urethra.  Follow these instructions at home:  Take over-the-counter and prescription medicines only as told by your health care provider.  Monitor your symptoms for any changes. Contact your health care provider with any changes.  Avoid drinking large amounts of liquid before going to bed or out in public.  Avoid or reduce how much caffeine or alcohol you drink.  Give yourself time when you urinate.  Keep all follow-up visits as told by your health care provider. This is important. Contact a health care provider if:  You have unexplained back pain.  Your symptoms do not get better with treatment.  You develop side effects from the medicine you are taking.  Your urine becomes very dark or has a bad smell.  Your lower abdomen becomes distended and you have trouble passing your urine. Get help right away if:  You have a fever or chills.  You suddenly cannot urinate.  You feel lightheaded, or very dizzy, or you faint.  There are large amounts  of blood or clots in the urine.  Your urinary problems become hard to manage.  You develop moderate to severe low back or flank pain. The flank is the side of your body between the ribs and the hip. These symptoms may represent a serious problem that is an emergency. Do not wait to see if the symptoms will go away. Get medical help right away. Call your local emergency services (911 in the [place] Do not drive yourself to the hospital. Summary  Benign prostatic hyperplasia (BPH) is an enlarged prostate that is caused by the normal aging process and not by cancer.  An enlarged prostate can press on the urethra. This can make it hard to pass urine.  This condition is part of a normal aging process and is more likely to develop in men over the age of 68 years.  Get help right away if you suddenly cannot urinate. This information is not intended to replace advice given to you by your health care provider. Make sure you discuss any questions you have with your health care provider. Document Released: 01/04/2005 Document Revised: 02/09/2016 Document Reviewed: 02/09/2016 Elsevier Interactive Patient Education  2018 [employer]. Tamsulosin capsules What is this medicine? TAMSULOSIN (tam SOO loe sin) is used to treat enlargement of the prostate gland in men, a condition called benign prostatic hyperplasia or BPH. It is not for use in women. It works by relaxing muscles in the prostate and bladder neck. This improves urine flow and reduces BPH symptoms. This medicine may be used for other purposes; ask your health care provider or pharmacist if you have questions. COMMON BRAND NAME(S): Flomax What should I tell my health care provider before I take this medicine? They need to know if you have any of the following conditions: -advanced kidney disease -advanced liver disease -low blood pressure -prostate cancer -an unusual or allergic reaction to tamsulosin, sulfa drugs, other medicines, foods,  dyes, or preservatives -pregnant or trying to get pregnant -breast-feeding How should I use this medicine? Take this medicine by mouth about 30 minutes after the same meal every day. Follow the directions on the prescription label. Swallow the capsules whole with a glass of water. Do not crush, chew, or open capsules.  Do not take your medicine more often than directed. Do not stop taking your medicine unless your doctor tells you to. Talk to your pediatrician regarding the use of this medicine in children. Special care may be needed. Overdosage: If you think you have taken too much of this medicine contact a poison control center or emergency room at once. NOTE: This medicine is only for you. Do not share this medicine with others. What if I miss a dose? If you miss a dose, take it as soon as you can. If it is almost time for your next dose, take only that dose. Do not take double or extra doses. If you stop taking your medicine for several days or more, ask your doctor or health care professional what dose you should start back on. What may interact with this medicine? -cimetidine -fluoxetine -ketoconazole -medicines for erectile disfunction like sildenafil, tadalafil, vardenafil -medicines for high blood pressure -other alpha-blockers like alfuzosin, doxazosin, phentolamine, phenoxybenzamine, prazosin, terazosin -warfarin This list may not describe all possible interactions. Give your health care provider a list of all the medicines, herbs, non-prescription drugs, or dietary supplements you use. Also tell them if you smoke, drink alcohol, or use illegal drugs. Some items may interact with your medicine. What should I watch for while using this medicine? Visit your doctor or health care professional for regular check ups. You will need lab work done before you start this medicine and regularly while you are taking it. Check your blood pressure as directed. Ask your health care professional what  your blood pressure should be, and when you should contact him or her. This medicine may make you feel dizzy or lightheaded. This is more likely to happen after the first dose, after an increase in dose, or during hot weather or exercise. Drinking alcohol and taking some medicines can make this worse. Do not drive, use machinery, or do anything that needs mental alertness until you know how this medicine affects you. Do not sit or stand up quickly. If you begin to feel dizzy, sit down until you feel better. These effects can decrease once your body adjusts to the medicine. Contact your doctor or health care professional right away if you have an erection that lasts longer than 4 hours or if it becomes painful. This may be a sign of a serious problem and must be treated right away to prevent permanent damage. If you are thinking of having cataract surgery, tell your eye surgeon that you have taken this medicine. What side effects may I notice from receiving this medicine? Side effects that you should report to your doctor or health care professional as soon as possible: -allergic reactions like skin rash or itching, hives, swelling of the lips, mouth, tongue, or throat -breathing problems -change in vision -feeling faint or lightheaded -irregular heartbeat -prolonged or painful erection -weakness Side effects that usually do not require medical attention (report to your doctor or health care professional if they continue or are bothersome): -back pain -change in sex drive or performance -constipation, nausea or vomiting -cough -drowsy -runny or stuffy nose -trouble sleeping This list may not describe all possible side effects. Call your doctor for medical advice about side effects. You may report side effects to FDA at 1-800-FDA-1088. Where should I keep my medicine? Keep out of the reach of children. Store at room temperature between 15 and 30 degrees C (59 and 86 degrees F). Throw away any  unused medicine after the expiration date. NOTE: This  sheet is a summary. It may not cover all possible information. If you have questions about this medicine, talk to your doctor, pharmacist, or health care provider.  2018 Elsevier/[employer] Standard (2012-01-05 14:11:34)

## 2017-12-21 NOTE — Patient Instructions (Signed)
Please schedule your next medicare wellness visit with me in 1 yr.  Continue to eat heart healthy diet (full of fruits, vegetables, whole grains, lean protein, water--limit salt, fat, and sugar intake) and increase physical activity as tolerated.  Bring a copy of your living will and/or healthcare power of attorney to your next office visit.   Anthony Greene , Thank you for taking time to come for your Medicare Wellness Visit. I appreciate your ongoing commitment to your health goals. Please review the following plan we discussed and let me know if I can assist you in the future.   These are the goals we discussed: Goals    . Maintain current health     Continue to exercise and eat healthy       This is a list of the screening recommended for you and due dates:  Health Maintenance  Topic Date Due  .  Hepatitis C: One time screening is recommended by Center for Disease Control  (CDC) for  adults born from [birth year] through 1966.   1950-11-05  . Tetanus Vaccine  07/31/1968  . Colon Cancer Screening  12/21/2025  . Flu Shot  Completed  . Pneumonia vaccines  Completed    Health Maintenance, Male A healthy lifestyle and preventive care is important for your health and wellness. Ask your health care provider about what schedule of regular examinations is right for you. What should I know about weight and diet? Eat a Healthy Diet  Eat plenty of vegetables, fruits, whole grains, low-fat dairy products, and lean protein.  Do not eat a lot of foods high in solid fats, added sugars, or salt.  Maintain a Healthy Weight Regular exercise can help you achieve or maintain a healthy weight. You should:  Do at least 150 minutes of exercise each week. The exercise should increase your heart rate and make you sweat (moderate-intensity exercise).  Do strength-training exercises at least twice a week.  Watch Your Levels of Cholesterol and Blood Lipids  Have your blood tested for lipids and  cholesterol every 5 years starting at 68 years of age. If you are at high risk for heart disease, you should start having your blood tested when you are 68 years old. You may need to have your cholesterol levels checked more often if: ? Your lipid or cholesterol levels are high. ? You are older than 68 years of age. ? You are at high risk for heart disease.  What should I know about cancer screening? Many types of cancers can be detected early and may often be prevented. Lung Cancer  You should be screened every year for lung cancer if: ? You are a current smoker who has smoked for at least 30 years. ? You are a former smoker who has quit within the past 15 years.  Talk to your health care provider about your screening options, when you should start screening, and how often you should be screened.  Colorectal Cancer  Routine colorectal cancer screening usually begins at 68 years of age and should be repeated every 5-10 years until you are 68 years old. You may need to be screened more often if early forms of precancerous polyps or small growths are found. Your health care provider may recommend screening at an earlier age if you have risk factors for colon cancer.  Your health care provider may recommend using home test kits to check for hidden blood in the stool.  A small camera at the end of a  tube can be used to examine your colon (sigmoidoscopy or colonoscopy). This checks for the earliest forms of colorectal cancer.  Prostate and Testicular Cancer  Depending on your age and overall health, your health care provider may do certain tests to screen for prostate and testicular cancer.  Talk to your health care provider about any symptoms or concerns you have about testicular or prostate cancer.  Skin Cancer  Check your skin from head to toe regularly.  Tell your health care provider about any new moles or changes in moles, especially if: ? There is a change in a mole's size, shape,  or color. ? You have a mole that is larger than a pencil eraser.  Always use sunscreen. Apply sunscreen liberally and repeat throughout the day.  Protect yourself by wearing long sleeves, pants, a wide-brimmed hat, and sunglasses when outside.  What should I know about heart disease, diabetes, and high blood pressure?  If you are 26-39 years of age, have your blood pressure checked every 3-5 years. If you are 11 years of age or older, have your blood pressure checked every year. You should have your blood pressure measured twice-once when you are at a hospital or clinic, and once when you are not at a hospital or clinic. Record the average of the two measurements. To check your blood pressure when you are not at a hospital or clinic, you can use: ? An automated blood pressure machine at a pharmacy. ? A home blood pressure monitor.  Talk to your health care provider about your target blood pressure.  If you are between 98-74 years old, ask your health care provider if you should take aspirin to prevent heart disease.  Have regular diabetes screenings by checking your fasting blood sugar level. ? If you are at a normal weight and have a low risk for diabetes, have this test once every three years after the age of 15. ? If you are overweight and have a high risk for diabetes, consider being tested at a younger age or more often.  A one-time screening for abdominal aortic aneurysm (AAA) by ultrasound is recommended for men aged 50-75 years who are current or former smokers. What should I know about preventing infection? Hepatitis B If you have a higher risk for hepatitis B, you should be screened for this virus. Talk with your health care provider to find out if you are at risk for hepatitis B infection. Hepatitis C Blood testing is recommended for:  Everyone born from [birth year] through 1966.  Anyone with known risk factors for hepatitis C.  Sexually Transmitted Diseases (STDs)  You should  be screened each year for STDs including gonorrhea and chlamydia if: ? You are sexually active and are younger than 68 years of age. ? You are older than 68 years of age and your health care provider tells you that you are at risk for this type of infection. ? Your sexual activity has changed since you were last screened and you are at an increased risk for chlamydia or gonorrhea. Ask your health care provider if you are at risk.  Talk with your health care provider about whether you are at high risk of being infected with HIV. Your health care provider may recommend a prescription medicine to help prevent HIV infection.  What else can I do?  Schedule regular health, dental, and eye exams.  Stay current with your vaccines (immunizations).  Do not use any tobacco products, such as cigarettes, chewing tobacco,  and e-cigarettes. If you need help quitting, ask your health care provider.  Limit alcohol intake to no more than 2 drinks per day. One drink equals 12 ounces of beer, 5 ounces of wine, or 1 ounces of hard liquor.  Do not use street drugs.  Do not share needles.  Ask your health care provider for help if you need support or information about quitting drugs.  Tell your health care provider if you often feel depressed.  Tell your health care provider if you have ever been abused or do not feel safe at home. This information is not intended to replace advice given to you by your health care provider. Make sure you discuss any questions you have with your health care provider. Document Released: 07/03/2007 Document Revised: 09/03/2015 Document Reviewed: 10/08/2014 Elsevier Interactive Patient Education  [employer].

## 2017-12-22 ENCOUNTER — Other Ambulatory Visit (INDEPENDENT_AMBULATORY_CARE_PROVIDER_SITE_OTHER): Payer: Medicare Other

## 2017-12-22 DIAGNOSIS — N401 Enlarged prostate with lower urinary tract symptoms: Secondary | ICD-10-CM | POA: Diagnosis not present

## 2017-12-22 DIAGNOSIS — Z0001 Encounter for general adult medical examination with abnormal findings: Secondary | ICD-10-CM | POA: Diagnosis not present

## 2017-12-22 DIAGNOSIS — D649 Anemia, unspecified: Secondary | ICD-10-CM

## 2017-12-22 DIAGNOSIS — R3911 Hesitancy of micturition: Secondary | ICD-10-CM | POA: Diagnosis not present

## 2017-12-22 LAB — CBC
HCT: 45.6 % (ref 39.0–52.0)
Hemoglobin: 15.4 g/dL (ref 13.0–17.0)
MCHC: 33.8 g/dL (ref 30.0–36.0)
MCV: 92.1 fl (ref 78.0–100.0)
Platelets: 265 10*3/uL (ref 150.0–400.0)
RBC: 4.95 Mil/uL (ref 4.22–5.81)
RDW: 14.9 % (ref 11.5–15.5)
WBC: 4.8 10*3/uL (ref 4.0–10.5)

## 2017-12-22 LAB — PSA: PSA: 5.83 ng/mL — ABNORMAL HIGH (ref 0.10–4.00)

## 2017-12-22 LAB — COMPREHENSIVE METABOLIC PANEL
ALT: 14 U/L (ref 0–53)
AST: 16 U/L (ref 0–37)
Albumin: 4.3 g/dL (ref 3.5–5.2)
Alkaline Phosphatase: 47 U/L (ref 39–117)
BUN: 16 mg/dL (ref 6–23)
CO2: 29 mEq/L (ref 19–32)
Calcium: 9.5 mg/dL (ref 8.4–10.5)
Chloride: 102 mEq/L (ref 96–112)
Creatinine, Ser: 0.99 mg/dL (ref 0.40–1.50)
GFR: 79.81 mL/min (ref >60.00)
Glucose, Bld: 93 mg/dL (ref 70–99)
Potassium: 4.4 mEq/L (ref 3.5–5.1)
Sodium: 137 mEq/L (ref 135–145)
Total Bilirubin: 0.9 mg/dL (ref 0.2–1.2)
Total Protein: 6.8 g/dL (ref 6.0–8.3)

## 2017-12-22 LAB — LIPID PANEL
Cholesterol: 183 mg/dL (ref 0–200)
HDL: 84 mg/dL (ref >39.00)
LDL Cholesterol: 88 mg/dL (ref 0–99)
NonHDL: 98.59
Total CHOL/HDL Ratio: 2
Triglycerides: 52 mg/dL (ref 0.0–149.0)
VLDL: 10.4 mg/dL (ref 0.0–40.0)

## 2017-12-22 LAB — URINE CULTURE
MICRO NUMBER:: 91452121
Result:: NO GROWTH
SPECIMEN QUALITY:: ADEQUATE

## 2017-12-23 DIAGNOSIS — D489 Neoplasm of uncertain behavior, unspecified: Secondary | ICD-10-CM | POA: Diagnosis not present

## 2017-12-23 DIAGNOSIS — D229 Melanocytic nevi, unspecified: Secondary | ICD-10-CM | POA: Diagnosis not present

## 2017-12-23 DIAGNOSIS — I781 Nevus, non-neoplastic: Secondary | ICD-10-CM | POA: Diagnosis not present

## 2017-12-23 DIAGNOSIS — L570 Actinic keratosis: Secondary | ICD-10-CM | POA: Diagnosis not present

## 2017-12-23 LAB — IRON,TIBC AND FERRITIN PANEL
%SAT: 56 % (calc) — ABNORMAL HIGH (ref 20–48)
Ferritin: 62 ng/mL (ref 24–380)
Iron: 171 ug/dL (ref 50–180)
TIBC: 306 mcg/dL (calc) (ref 250–425)

## 2017-12-26 DIAGNOSIS — D649 Anemia, unspecified: Secondary | ICD-10-CM | POA: Insufficient documentation

## 2017-12-26 DIAGNOSIS — L989 Disorder of the skin and subcutaneous tissue, unspecified: Secondary | ICD-10-CM | POA: Insufficient documentation

## 2017-12-26 DIAGNOSIS — R972 Elevated prostate specific antigen [PSA]: Secondary | ICD-10-CM | POA: Insufficient documentation

## 2017-12-26 DIAGNOSIS — R3911 Hesitancy of micturition: Secondary | ICD-10-CM

## 2017-12-26 DIAGNOSIS — N401 Enlarged prostate with lower urinary tract symptoms: Secondary | ICD-10-CM | POA: Insufficient documentation

## 2017-12-26 DIAGNOSIS — H9190 Unspecified hearing loss, unspecified ear: Secondary | ICD-10-CM | POA: Insufficient documentation

## 2017-12-26 DIAGNOSIS — H6123 Impacted cerumen, bilateral: Secondary | ICD-10-CM | POA: Insufficient documentation

## 2017-12-27 ENCOUNTER — Other Ambulatory Visit: Payer: Self-pay

## 2017-12-27 DIAGNOSIS — R972 Elevated prostate specific antigen [PSA]: Secondary | ICD-10-CM

## 2017-12-28 ENCOUNTER — Telehealth: Payer: Self-pay

## 2017-12-28 NOTE — Telephone Encounter (Signed)
Patient called to schedule surgery. The patient is scheduled for surgery with Dr Rosana Hoes at [facility] on 01/20/18. He will pre admit at the hospital on 01/09/18 at 8:15 am. The patient is aware of dates, time, and instructions.

## 2018-01-03 DIAGNOSIS — L4050 Arthropathic psoriasis, unspecified: Secondary | ICD-10-CM | POA: Diagnosis not present

## 2018-01-05 DIAGNOSIS — L409 Psoriasis, unspecified: Secondary | ICD-10-CM | POA: Diagnosis not present

## 2018-01-05 DIAGNOSIS — M2550 Pain in unspecified joint: Secondary | ICD-10-CM | POA: Diagnosis not present

## 2018-01-05 DIAGNOSIS — M545 Low back pain: Secondary | ICD-10-CM | POA: Diagnosis not present

## 2018-01-05 DIAGNOSIS — L4050 Arthropathic psoriasis, unspecified: Secondary | ICD-10-CM | POA: Diagnosis not present

## 2018-01-05 DIAGNOSIS — E669 Obesity, unspecified: Secondary | ICD-10-CM | POA: Diagnosis not present

## 2018-01-05 DIAGNOSIS — Z79899 Other long term (current) drug therapy: Secondary | ICD-10-CM | POA: Diagnosis not present

## 2018-01-05 DIAGNOSIS — Z6830 Body mass index (BMI) 30.0-30.9, adult: Secondary | ICD-10-CM | POA: Diagnosis not present

## 2018-01-05 DIAGNOSIS — M150 Primary generalized (osteo)arthritis: Secondary | ICD-10-CM | POA: Diagnosis not present

## 2018-01-09 ENCOUNTER — Encounter
Admission: RE | Admit: 2018-01-09 | Discharge: 2018-01-09 | Disposition: A | Discharge status: Home / Self Care | Payer: Medicare Other | Source: Ambulatory Visit | Attending: Surgery | Admitting: Surgery

## 2018-01-09 ENCOUNTER — Other Ambulatory Visit: Payer: Self-pay

## 2018-01-09 DIAGNOSIS — Z01810 Encounter for preprocedural cardiovascular examination: Secondary | ICD-10-CM | POA: Insufficient documentation

## 2018-01-09 NOTE — Patient Instructions (Signed)
Your procedure is scheduled on: Friday 01/20/18.   Report to DAY SURGERY DEPARTMENT LOCATED ON 2ND FLOOR MEDICAL MALL ENTRANCE. To find out your arrival time please call [phone] between 1PM - 3PM on Thursday 01/19/18.    Remember: Instructions that are not followed completely may result in serious medical risk, up to and including death, or upon the discretion of your surgeon and anesthesiologist your surgery may need to be rescheduled.      _X__ 1. Do not eat food after midnight the night before your procedure.                 No gum chewing or hard candies. You may drink clear liquids up to 2 hours                 before you are scheduled to arrive for your surgery- DO NOT drink clear                 liquids within 2 hours of the start of your surgery.                 Clear Liquids include:  water, apple juice without pulp, clear carbohydrate                 drink such as Clearfast or Gatorade, Black Coffee or Tea (Do not add                 anything to coffee or tea).  __X__2.  On the morning of surgery brush your teeth with toothpaste and water, you may rinse your mouth with mouthwash if you wish.  Do not swallow any toothpaste or mouthwash.     _X__ 3.  No Alcohol for 24 hours before or after surgery.   __X__4.  Notify your doctor if there is any change in your medical condition      (cold, fever, infections).     Do not wear jewelry, make-up, hairpins, clips or nail polish. Do not wear lotions, powders, deodorant, or cologne.  Do not shave 48 hours prior to surgery. Men may shave face and neck. Do not bring valuables to the hospital.    [facility] is not responsible for any belongings or valuables.  Contacts, dentures/partials or body piercings may not be worn into surgery. Bring a case for your contacts, glasses or hearing aids, a denture cup will be supplied.   Patients discharged the day of surgery will not be allowed to drive home.    Please read over the  following fact sheets that you were given:   MRSA Information   __X__ Take these medicines the morning of surgery with A SIP OF WATER:     1. tamsulosin (FLOMAX) 0.4 MG CAPS capsule      __X__ Use CHG Soap as directed   __X__ Stop Anti-inflammatories 7 days before surgery such as Advil, Ibuprofen, Motrin, BC or Goodies Powder, Naprosyn, Naproxen, Aleve, Aspirin, Meloxicam, Celebrex. LAST DOSE Friday 01/13/18. May take Tylenol if needed for pain or discomfort.    __X__ Do not start any new herbal supplements before your surgery.

## 2018-01-10 DIAGNOSIS — H9113 Presbycusis, bilateral: Secondary | ICD-10-CM | POA: Diagnosis not present

## 2018-01-10 DIAGNOSIS — H903 Sensorineural hearing loss, bilateral: Secondary | ICD-10-CM | POA: Diagnosis not present

## 2018-01-19 MED ORDER — VANCOMYCIN HCL IN DEXTROSE 1-5 GM/200ML-% IV SOLN: 1000.0000 mg | INTRAVENOUS | Status: DC

## 2018-01-20 ENCOUNTER — Encounter: Payer: Self-pay | Admitting: *Deleted

## 2018-01-20 ENCOUNTER — Other Ambulatory Visit: Payer: Self-pay

## 2018-01-20 ENCOUNTER — Ambulatory Visit
Admission: RE | Admit: 2018-01-20 | Discharge: 2018-01-20 | Disposition: A | Discharge status: Home / Self Care | Payer: Medicare Other | Attending: Surgery | Admitting: Surgery

## 2018-01-20 ENCOUNTER — Ambulatory Visit: Payer: Medicare Other | Admitting: Anesthesiology

## 2018-01-20 ENCOUNTER — Encounter
Admission: RE | Disposition: A | Discharge status: Home / Self Care | Payer: Self-pay | Source: Home / Self Care | Attending: Surgery

## 2018-01-20 DIAGNOSIS — Z87891 Personal history of nicotine dependence: Secondary | ICD-10-CM | POA: Insufficient documentation

## 2018-01-20 DIAGNOSIS — L723 Sebaceous cyst: Secondary | ICD-10-CM | POA: Diagnosis not present

## 2018-01-20 DIAGNOSIS — L4050 Arthropathic psoriasis, unspecified: Secondary | ICD-10-CM | POA: Insufficient documentation

## 2018-01-20 DIAGNOSIS — I10 Essential (primary) hypertension: Secondary | ICD-10-CM | POA: Insufficient documentation

## 2018-01-20 DIAGNOSIS — L0591 Pilonidal cyst without abscess: Secondary | ICD-10-CM | POA: Diagnosis not present

## 2018-01-20 DIAGNOSIS — L720 Epidermal cyst: Secondary | ICD-10-CM | POA: Diagnosis not present

## 2018-01-20 DIAGNOSIS — Z79899 Other long term (current) drug therapy: Secondary | ICD-10-CM | POA: Insufficient documentation

## 2018-01-20 DIAGNOSIS — D239 Other benign neoplasm of skin, unspecified: Secondary | ICD-10-CM | POA: Diagnosis not present

## 2018-01-20 DIAGNOSIS — L0501 Pilonidal cyst with abscess: Secondary | ICD-10-CM | POA: Insufficient documentation

## 2018-01-20 HISTORY — PX: PILONIDAL CYST EXCISION: SHX744

## 2018-01-20 SURGERY — EXCISION, SIMPLE PILONIDAL CYST
Anesthesia: General

## 2018-01-20 MED ORDER — ROCURONIUM BROMIDE 50 MG/5ML IV SOLN
INTRAVENOUS | Status: AC
  Filled 2018-01-20: qty 1

## 2018-01-20 MED ORDER — MIDAZOLAM HCL 2 MG/2ML IJ SOLN
INTRAMUSCULAR | Status: AC
  Filled 2018-01-20: qty 2

## 2018-01-20 MED ORDER — BUPIVACAINE LIPOSOME 1.3 % IJ SUSP
INTRAMUSCULAR | Status: DC | PRN
  Administered 2018-01-20: 20 mL

## 2018-01-20 MED ORDER — PHENYLEPHRINE HCL 10 MG/ML IJ SOLN
INTRAMUSCULAR | Status: DC | PRN
  Administered 2018-01-20: 100 ug via INTRAVENOUS

## 2018-01-20 MED ORDER — KETOROLAC TROMETHAMINE 60 MG/2ML IM SOLN: 60.0000 mg | Freq: Once | INTRAMUSCULAR | 0 refills | Status: DC

## 2018-01-20 MED ORDER — SUGAMMADEX SODIUM 200 MG/2ML IV SOLN
INTRAVENOUS | Status: AC
  Filled 2018-01-20: qty 2

## 2018-01-20 MED ORDER — PROPOFOL 10 MG/ML IV BOLUS
INTRAVENOUS | Status: DC | PRN
  Administered 2018-01-20: 140 mg via INTRAVENOUS

## 2018-01-20 MED ORDER — FENTANYL CITRATE (PF) 100 MCG/2ML IJ SOLN: 25.0000 ug | INTRAMUSCULAR | Status: DC | PRN

## 2018-01-20 MED ORDER — MIDAZOLAM HCL 2 MG/2ML IJ SOLN
INTRAMUSCULAR | Status: DC | PRN
  Administered 2018-01-20: 2 mg via INTRAVENOUS

## 2018-01-20 MED ORDER — ONDANSETRON HCL 4 MG/2ML IJ SOLN
INTRAMUSCULAR | Status: DC | PRN
  Administered 2018-01-20: 4 mg via INTRAVENOUS

## 2018-01-20 MED ORDER — DEXAMETHASONE SODIUM PHOSPHATE 10 MG/ML IJ SOLN
INTRAMUSCULAR | Status: DC | PRN
  Administered 2018-01-20: 10 mg via INTRAVENOUS

## 2018-01-20 MED ORDER — PROPOFOL 10 MG/ML IV BOLUS
INTRAVENOUS | Status: AC
  Filled 2018-01-20: qty 20

## 2018-01-20 MED ORDER — CHLORHEXIDINE GLUCONATE CLOTH 2 % EX PADS: 6.0000 | MEDICATED_PAD | Freq: Once | CUTANEOUS | Status: DC

## 2018-01-20 MED ORDER — LIDOCAINE 2% (20 MG/ML) 5 ML SYRINGE
INTRAMUSCULAR | Status: DC | PRN
  Administered 2018-01-20: 100 mg via INTRAVENOUS

## 2018-01-20 MED ORDER — ONDANSETRON HCL 4 MG/2ML IJ SOLN
INTRAMUSCULAR | Status: AC
  Filled 2018-01-20: qty 2

## 2018-01-20 MED ORDER — BUPIVACAINE LIPOSOME 1.3 % IJ SUSP: 20.0000 mL | Freq: Once | INTRAMUSCULAR | Status: DC

## 2018-01-20 MED ORDER — ACETAMINOPHEN 500 MG PO TABS
1000.0000 mg | ORAL_TABLET | ORAL | Status: AC
  Administered 2018-01-20: 1000 mg via ORAL

## 2018-01-20 MED ORDER — BUPIVACAINE HCL (PF) 0.5 % IJ SOLN
INTRAMUSCULAR | Status: AC
  Filled 2018-01-20: qty 30

## 2018-01-20 MED ORDER — LIDOCAINE HCL (PF) 1 % IJ SOLN
INTRAMUSCULAR | Status: AC
  Filled 2018-01-20: qty 30

## 2018-01-20 MED ORDER — TRAMADOL-ACETAMINOPHEN 37.5-325 MG PO TABS: 1.0000 | ORAL_TABLET | Freq: Four times a day (QID) | ORAL | 0 refills | Status: DC | PRN

## 2018-01-20 MED ORDER — BACITRACIN ZINC 500 UNIT/GM EX OINT
TOPICAL_OINTMENT | CUTANEOUS | Status: AC
  Filled 2018-01-20: qty 28.35

## 2018-01-20 MED ORDER — ACETAMINOPHEN 500 MG PO TABS
ORAL_TABLET | ORAL | Status: AC
  Administered 2018-01-20: 1000 mg via ORAL
  Filled 2018-01-20: qty 2

## 2018-01-20 MED ORDER — KETOROLAC TROMETHAMINE 30 MG/ML IJ SOLN
INTRAMUSCULAR | Status: DC | PRN
  Administered 2018-01-20: 30 mg via INTRAVENOUS

## 2018-01-20 MED ORDER — EPHEDRINE SULFATE 50 MG/ML IJ SOLN
INTRAMUSCULAR | Status: DC | PRN
  Administered 2018-01-20: 10 mg via INTRAVENOUS

## 2018-01-20 MED ORDER — BUPIVACAINE LIPOSOME 1.3 % IJ SUSP
INTRAMUSCULAR | Status: AC
  Filled 2018-01-20: qty 20

## 2018-01-20 MED ORDER — CEFAZOLIN SODIUM-DEXTROSE 2-4 GM/100ML-% IV SOLN
2.0000 g | Freq: Once | INTRAVENOUS | Status: AC
  Administered 2018-01-20: 2 g via INTRAVENOUS

## 2018-01-20 MED ORDER — ONDANSETRON HCL 4 MG/2ML IJ SOLN: 4.0000 mg | Freq: Once | INTRAMUSCULAR | Status: DC | PRN

## 2018-01-20 MED ORDER — FENTANYL CITRATE (PF) 100 MCG/2ML IJ SOLN
INTRAMUSCULAR | Status: DC | PRN
  Administered 2018-01-20: 100 ug via INTRAVENOUS

## 2018-01-20 MED ORDER — METHYLENE BLUE 0.5 % INJ SOLN
INTRAVENOUS | Status: AC
  Filled 2018-01-20: qty 10

## 2018-01-20 MED ORDER — VANCOMYCIN HCL IN DEXTROSE 1-5 GM/200ML-% IV SOLN
INTRAVENOUS | Status: AC
  Filled 2018-01-20: qty 200

## 2018-01-20 MED ORDER — ROCURONIUM BROMIDE 100 MG/10ML IV SOLN
INTRAVENOUS | Status: DC | PRN
  Administered 2018-01-20: 30 mg via INTRAVENOUS

## 2018-01-20 MED ORDER — DEXAMETHASONE SODIUM PHOSPHATE 10 MG/ML IJ SOLN
INTRAMUSCULAR | Status: AC
  Filled 2018-01-20: qty 1

## 2018-01-20 MED ORDER — FAMOTIDINE 20 MG PO TABS
20.0000 mg | ORAL_TABLET | Freq: Once | ORAL | Status: AC
  Administered 2018-01-20: 20 mg via ORAL

## 2018-01-20 MED ORDER — DEXMEDETOMIDINE HCL 200 MCG/2ML IV SOLN
INTRAVENOUS | Status: DC | PRN
  Administered 2018-01-20: 8 ug via INTRAVENOUS

## 2018-01-20 MED ORDER — SUGAMMADEX SODIUM 200 MG/2ML IV SOLN
INTRAVENOUS | Status: DC | PRN
  Administered 2018-01-20: 194.6 mg via INTRAVENOUS

## 2018-01-20 MED ORDER — SUCCINYLCHOLINE CHLORIDE 20 MG/ML IJ SOLN
INTRAMUSCULAR | Status: AC
  Filled 2018-01-20: qty 1

## 2018-01-20 MED ORDER — LACTATED RINGERS IV SOLN
INTRAVENOUS | Status: DC
  Administered 2018-01-20: 07:00:00 via INTRAVENOUS

## 2018-01-20 MED ORDER — FAMOTIDINE 20 MG PO TABS
ORAL_TABLET | ORAL | Status: AC
  Administered 2018-01-20: 20 mg via ORAL
  Filled 2018-01-20: qty 1

## 2018-01-20 MED ORDER — LIDOCAINE HCL (PF) 2 % IJ SOLN
INTRAMUSCULAR | Status: AC
  Filled 2018-01-20: qty 10

## 2018-01-20 MED ORDER — METHYLENE BLUE 0.5 % INJ SOLN
INTRAVENOUS | Status: DC | PRN
  Administered 2018-01-20: 1 mL via SUBMUCOSAL

## 2018-01-20 MED ORDER — KETOROLAC TROMETHAMINE 10 MG PO TABS: 10.0000 mg | ORAL_TABLET | Freq: Four times a day (QID) | ORAL | 0 refills | Status: DC | PRN

## 2018-01-20 MED ORDER — FENTANYL CITRATE (PF) 250 MCG/5ML IJ SOLN
INTRAMUSCULAR | Status: AC
  Filled 2018-01-20: qty 5

## 2018-01-20 SURGICAL SUPPLY — 29 items
BLADE SURG 15 STRL LF DISP TIS (BLADE) ×1 IMPLANT
BLADE SURG 15 STRL SS (BLADE) ×1
CANISTER SUCT 1200ML W/VALVE (MISCELLANEOUS) ×2 IMPLANT
COVER WAND RF STERILE (DRAPES) ×2 IMPLANT
DECANTER SPIKE VIAL GLASS SM (MISCELLANEOUS) ×2 IMPLANT
DERMABOND ADVANCED (GAUZE/BANDAGES/DRESSINGS) ×1
DERMABOND ADVANCED .7 DNX12 (GAUZE/BANDAGES/DRESSINGS) ×1 IMPLANT
DRAPE LAPAROTOMY 100X77 ABD (DRAPES) ×2 IMPLANT
ELECT REM PT RETURN 9FT ADLT (ELECTROSURGICAL) ×2
ELECTRODE REM PT RTRN 9FT ADLT (ELECTROSURGICAL) ×1 IMPLANT
GAUZE PETRO XEROFOAM 5X9 (MISCELLANEOUS) ×2 IMPLANT
GAUZE SPONGE 4X4 12PLY STRL (GAUZE/BANDAGES/DRESSINGS) ×2 IMPLANT
GLOVE BIO SURGEON STRL SZ7 (GLOVE) ×2 IMPLANT
GLOVE BIOGEL PI IND STRL 7.5 (GLOVE) ×1 IMPLANT
GLOVE BIOGEL PI INDICATOR 7.5 (GLOVE) ×1
GOWN STRL REUS W/ TWL XL LVL3 (GOWN DISPOSABLE) ×1 IMPLANT
GOWN STRL REUS W/TWL LRG LVL3 (GOWN DISPOSABLE) ×6 IMPLANT
GOWN STRL REUS W/TWL XL LVL3 (GOWN DISPOSABLE) ×1
KIT TURNOVER KIT A (KITS) ×2 IMPLANT
NEEDLE HYPO 18GX1.5 BLUNT FILL (NEEDLE) ×2 IMPLANT
NEEDLE HYPO 22GX1.5 SAFETY (NEEDLE) ×2 IMPLANT
NS IRRIG 1000ML POUR BTL (IV SOLUTION) ×2 IMPLANT
PACK BASIN MINOR ARMC (MISCELLANEOUS) ×2 IMPLANT
PAD ABD DERMACEA PRESS 5X9 (GAUZE/BANDAGES/DRESSINGS) ×2 IMPLANT
SOL PREP PVP 2OZ (MISCELLANEOUS) ×2
SOLUTION PREP PVP 2OZ (MISCELLANEOUS) ×1 IMPLANT
SPONGE LAP 18X18 RF (DISPOSABLE) ×2 IMPLANT
SUT PROLENE 3 0 PS 2 (SUTURE) ×4 IMPLANT
SYR 10ML LL (SYRINGE) ×2 IMPLANT

## 2018-01-20 NOTE — Transfer of Care (Signed)
Immediate Anesthesia Transfer of Care Note  Patient: Anthony Greene  Procedure(s) Performed: EXCISION OF INCREASINGLY SYMPTOMATIC PILONIDAL CYST AND RIGHT OF UPPER BACK SEBACEOUS CYST (N/A )  Patient Location: PACU  Anesthesia Type:General  Level of Consciousness: awake, alert  and oriented  Airway & Oxygen Therapy: Patient Spontanous Breathing and Patient connected to nasal cannula oxygen  Post-op Assessment: Report given to RN and Post -op Vital signs reviewed and stable  Post vital signs: Reviewed and stable  Last Vitals:  Vitals Value Taken Time  BP 140/81 01/20/2018  9:55 AM  Temp 36.1 C 01/20/2018  9:54 AM  Pulse 65 01/20/2018  9:55 AM  Resp 13 01/20/2018  9:55 AM  SpO2 97 % 01/20/2018  9:55 AM    Last Pain:  Vitals:   01/20/18 0620  TempSrc: Tympanic         Complications: No apparent anesthesia complications

## 2018-01-20 NOTE — Op Note (Signed)
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 (L05.92) and increasingly symptomatic (painful) Right mid-upper back sebaceous cyst (icd-10: L72.3)  PROCEDURE(S):  1.) Excision of symptomatic (painful) Right mid-upper back sebaceous cyst (cpt: 21930) 2.) Excision of extensive persistently symptomatic (painful and draining) formerly infected recurrent pilonidal cyst and sinus (cpt: 11771)  INTRAOPERATIVE FINDINGS: 2.5 cm long x 1.0 cm wide Right upper back sebaceous cyst with expression of small amount cyst contents under pressure following injection of local anesthetic and 6.0 cm long x 3.0 cm wide x 4.5 cm deep pilonidal cyst specimen with small amount of milky fluid draining from sinus tract pre-excision, hemostatic at completion  INTRAVENOUS FLUIDS: 800 mL crystalloid   ESTIMATED BLOOD LOSS: Minimal (<30 mL)  URINE OUTPUT: No Foley  SPECIMENS: Right upper mid-back sebaceous cyst and sacral-coccygeal pilonidal cyst  IMPLANTS: None  DRAINS: 1/4" Penrose drain to sacral-coccygeal wound/cavity  COMPLICATIONS: None apparent  CONDITION AT END OF PROCEDURE: Hemodynamically stable and extubated  DISPOSITION OF PATIENT: PACU  INDICATIONS FOR PROCEDURE:  Patient is a 68 y.o. male who presented to clinic for follow-up evaluation of sacrococcygeal pain. Patient reportedhe previously underwent excision of his pilonidal cyst 25 years ago,since which he said ithadn't bothered him since untilhefell and  developedanearby hematoma. It seems that when his pilonidal cyst shortly thereafter became infected and was drained by ED physician, the large buttock hematoma likewise became infected and required operative management, since which patient's pilonidal cyst has remained intermittently painful. Patient stated his sacrococcygeal pain thenrecently worsened until it drained spontaneously and has since feltbetter. Patient requested excision of his recurrent pilonidal cyst to reduce risk of ongoing issues. All risks, benefits, and alternatives to above procedure were discussed with the patient, all of patient's questions were answered to his expressed satisfaction, and informed consent was accordingly obtained and documented at that time.  DETAILS OF PROCEDURE: Patient was brought to the operating suite and appropriately identified. General anesthesia was administered along with appropriate pre-operative antibiotics, and endotracheal intubation was performed by anesthetist. In lateral decubitus position with his Left side down, secured using a deflatable bean bag, all pressure points were appropriately padded, buttocks were gently retracted using tape, and the operative sites were prepped and draped in the usual sterile fashion. Following a brief time out, visible sinus tract was identified, probed, and injected with methylene blue dye. There was at this time visualized a small amount of milky fluid expressed from the pilonidal cyst. Though there's been no surrounding erythema or other signs of abscess/infection, cultures were obtained, and decision was made to first proceed with excision of non-infected Right mid-upper back sebaceous cyst.  Local anesthetic (Exparel-bupivicaine) was injected subcutaneously and deep to the sebaceous cyst, around which a 3 cm long and 1 cm wide oblique elliptical incision was made using a #15 blade scalpel, and incision was extended deep around subcutaneous mass to fascia using  sharp + blunt dissection and electrocautery. During the course of dissecting free the cyst, it was not disrupted and was removed intact. The specimen was then measured and handed off the field for pathology processing. Hemostasis was achieved. Further examination did not reveal any additional cyst. Additional local  anesthetic (Exparel-bupivicaine) was injected, and deep tissue and dermis were re-approximated using buried interrupted 3-0 Vicryl suture. Skin was then cleaned and dried, and sterile Dermabond skin glue was applied to the wound.  Initial 4 cm long elliptical incision was made to encompass the pilonidal cyst and all visualized sinus tracts using a #15 blade scalpel and extended deep through subcutaneous tissues using electrocautery. The entire tract was excised down to normal tissue and fascia deep to the sinus tract, and all blue-stained tissue was likewise excised in its entirety. Hemostasis was achieved with electrocautery as needed. The resulting wound was then measured and documented, and Exparel-bupivicaine local anesthetic was injected subcutaneously and into fascia. Buried horizontal mattress 3-0 Vicryl sutures were used to re-approximate dermis, and subsequent 3-0 Nylon suture was used to re-approximate epidermis/dermis. A 1/4" Penrose drain was placed between dermal Vicryl sutures and likewise secured using 3-0 Nylon suture to permit drainage. Surrounding skin was then cleaned and dried, 4x4" dry gauze and adhesive dressing was applied. Patient was then safely able to be repositioned, extubated, awakened, and transferred to PACU for post-operative monitoring and care.  I was present for all aspects of the above procedure, and no operative complications were apparent.

## 2018-01-20 NOTE — H&P (Signed)
Surgical Pre-Op History & Physical  HPI:  68 y.o. Male recently presented to clinic for follow-up evaluation of sacrococcygeal pain. Patient reported he previously underwent excision of his pilonidal cyst 25 years ago, since which he said it hadn't bothered him since until he fell and developed a nearby hematoma. It seems that when his pilonidal cyst shortly thereafter became infection and was drained by ED physician, the large buttock hematoma likewise became infected and required operative management, since which pilonidal cyst has remained intermittently painful. Patient stateed this sacrococcygeal pain then worsened 1 week ago, since which it drained spontaneously and has since felt better. Patient requested excision of his pilonidal cyst to reduce risk of ongoing associated issues. He reported good hygiene and currently denies any fever/chills, N/V, CP, or SOB.  No changes to his history are apparent.  Review of Systems:  Constitutional: denies any other weight loss, fever, chills, or sweats  Eyes: denies any other vision changes, history of eye injury  ENT: denies sore throat, hearing problems  Respiratory: denies shortness of breath, wheezing  Cardiovascular: denies chest pain, palpitations  Gastrointestinal: denies abdominal pain, N/V, or diarrhea Musculoskeletal: denies any other joint pains or cramps  Skin: Denies any other rashes or skin discolorations except as per interval history Neurological: denies any other headache, dizziness, weakness  Psychiatric: denies any other depression, anxiety  All other review of systems: otherwise negative   Vital Signs:  BP (!) 150/84   Pulse 61   Temp 97.8 F (36.6 C) (Tympanic)   Resp 14   Ht 5\' 10"  (1.778 m)   Wt 97.3 kg   SpO2 99%   BMI 30.78 kg/m    Physical Exam:  Constitutional:  -- Normal body habitus  -- Awake, alert, and oriented x3  Eyes:  -- Pupils equally round and reactive to light  -- No scleral icterus  Ear,  nose, throat:  -- No jugular venous distension  -- No nasal drainage, bleeding Pulmonary:  -- No crackles -- Equal breath sounds bilaterally -- Breathing non-labored at rest Cardiovascular:  -- S1, S2 present  -- No pericardial rubs  Gastrointestinal:  -- Soft, nontender, non-distended, no guarding/rebound  -- No abdominal masses appreciated, pulsatile or otherwise  Musculoskeletal / Integumentary:  -- Wounds or skin discoloration: Left of midline 2 cm palpable moderately tender to palpation sacral pilonidal cyst without any surrounding erythema or currently appreciable drainage -- Extremities: B/L UE and LE FROM, hands and feet warm, no edema  Neurologic:  -- Motor function: intact and symmetric  -- Sensation: intact and symmetric   Laboratory studies: CBC Latest Ref Rng & Units 06/23/2017  WBC 3.8 - 10.6 K/uL 8.5  Hemoglobin 13.0 - 18.0 g/dL 11.8(L)  Hematocrit 40.0 - 52.0 % 34.4(L)  Platelets 150 - 440 K/uL 580(H)   CMP Latest Ref Rng & Units 06/23/2017  Glucose 65 - 99 mg/dL 102(H)  BUN 6 - 20 mg/dL 9  Creatinine 0.61 - 1.24 mg/dL 0.85  Sodium 135 - 145 mmol/L 134(L)  Potassium 3.5 - 5.1 mmol/L 4.0  Chloride 101 - 111 mmol/L 101  CO2 22 - 32 mmol/L 24  Calcium 8.9 - 10.3 mg/dL 8.6(L)   Imaging: No new pertinent imaging available for review at this time  Assessment:  68 y.o. yo Male with a problem list including...  Patient Active Problem List   Diagnosis Date Noted  . Elevated BP without diagnosis of hypertension 07/11/2017  . Gluteal abscess   . Hematoma of hip, right, initial  encounter 06/23/2017  . Pilonidal abscess 06/22/2017  . Daily consumption of alcohol 06/07/2017  . Injury resulting from fall from height 06/07/2017  . Intractable low back pain 06/07/2017  . Closed fracture of transverse process of lumbar vertebra ([place]) 06/06/2017  . Psoriatic arthritis ([place]) 01/19/1999    presents to clinic for follow-up evaluation of persistently and increasingly  symptomatic currently non-infected chronic sacrococcygeal pilonidal cyst, complicated by comorbidities including HTN, chronic lower back pain, history of alcohol daily consumption, and psoriatic arthritis.  Plan:              - natural history of pilonidal cysts +/- sinus tract(s) discussed             - all risks, benefits, and alternatives to excision of recurrent pilonidal cyst and increasingly symptomatic non-infected Left of mid-upper back sebaceous cyst were discussed with the patient, all of his questions were answered to his expressed satisfaction, patient expresses he wishes to proceed, and informed consent was obtained.             - will plan for elective excision of recurrent pilonidal cyst (without application of negative pressure wound VAC dressing) and increasingly symptomatic non-infected Left of mid-upper back sebaceous cyst today             - anticipate return to clinic 2 weeks following above elective procedures             - instructed to call office if any questions or concerns  All of the above recommendations were discussed with the patient, and all of patient's questions were answered to his expressed satisfaction.  -- Marilynne Drivers Rosana Hoes, MD, [place]: [facility] - Partnering for exceptional care. Office: [phone]

## 2018-01-20 NOTE — Anesthesia Preprocedure Evaluation (Signed)
Anesthesia Evaluation  Patient identified by MRN, date of birth, ID band Patient awake    Reviewed: Allergy & Precautions, H&P , NPO status , Patient's Chart, lab work & pertinent test results, reviewed documented beta blocker date and time   Airway Mallampati: II  TM Distance: >3 FB Neck ROM: full    Dental  (+) Teeth Intact   Pulmonary neg pulmonary ROS, former smoker,    Pulmonary exam normal        Cardiovascular Exercise Tolerance: Good negative cardio ROS Normal cardiovascular exam Rhythm:regular Rate:Normal     Neuro/Psych Psoriatic arthritis.  negative neurological ROS  negative psych ROS   GI/Hepatic negative GI ROS, Neg liver ROS,   Endo/Other  negative endocrine ROS  Renal/GU negative Renal ROS  negative genitourinary   Musculoskeletal   Abdominal   Peds  Hematology  (+) Blood dyscrasia, anemia ,   Anesthesia Other Findings Past Medical History: No date: Arthritis 06/06/2017: Closed fracture of transverse process of lumbar vertebra  ([place]) 06/23/2017: Hematoma of hip, right, initial encounter 06/07/2017: Injury resulting from fall from height 06/07/2017: Intractable low back pain 06/22/2017: Pilonidal abscess Past Surgical History: No date: [place]; Right No date: COLONOSCOPY WITH PROPOFOL 06/25/2017: DRESSING CHANGE UNDER ANESTHESIA; N/A     Comment:  Procedure: DRESSING CHANGE UNDER ANESTHESIA;  Surgeon:               Florene Glen, MD;  Location: [facility] ORS;  Service:               General;  Laterality: N/A; 06/23/2017: IRRIGATION AND DEBRIDEMENT BUTTOCKS; Right     Comment:  Procedure: IRRIGATION AND DEBRIDEMENT BUTTOCKS;                Surgeon: Vickie Epley, MD;  Location: [facility] ORS;                Service: General;  Laterality: Right; No date: JOINT REPLACEMENT     Comment:  BL knee No date: KNEE ARTHROSCOPY; Right No date: TONSILLECTOMY BMI    Body Mass Index:  30.78 kg/m     Reproductive/Obstetrics negative OB ROS                             Anesthesia Physical Anesthesia Plan  ASA: II  Anesthesia Plan: General ETT   Post-op Pain Management:    Induction:   PONV Risk Score and Plan:   Airway Management Planned:   Additional Equipment:   Intra-op Plan:   Post-operative Plan:   Informed Consent: I have reviewed the patients History and Physical, chart, labs and discussed the procedure including the risks, benefits and alternatives for the proposed anesthesia with the patient or authorized representative who has indicated his/her understanding and acceptance.   Dental Advisory Given  Plan Discussed with: CRNA  Anesthesia Plan Comments:         Anesthesia Quick Evaluation

## 2018-01-20 NOTE — Interval H&P Note (Signed)
History and Physical Interval Note:  01/20/2018 7:30 AM  Anthony Greene  has presented today for surgery, with the diagnosis of PILONIDAL AND MID-UPPER BACK SEBACEOUS CYST  The various methods of treatment have been discussed with the patient and family. After consideration of risks, benefits and other options for treatment, the patient has consented to  Procedure(s): EXCISION OF INCREASINGLY SYMPTOMATIC PILONIDAL CYST AND RIGHT OF UPPER BACK SEBACEOUS CYST (N/A) as a surgical intervention .  The patient's history has been reviewed, patient examined, no change in status, stable for surgery.  I have reviewed the patient's chart and labs.  Questions were answered to the patient's satisfaction.     Vickie Epley

## 2018-01-20 NOTE — Anesthesia Post-op Follow-up Note (Signed)
Anesthesia QCDR form completed.        

## 2018-01-20 NOTE — Discharge Instructions (Addendum)
In addition to included general post-operative instructions for Excision of Pilonidal Cyst and Excision lf Sebaceous/Epidermoid Cyst,  Diet: Resume home heart healthy diet.   Activity: No heavy lifting >15 - 20 pounds (children, pets, laundry, garbage) or strenuous activity until follow-up, but light activity and walking are encouraged. Do not drive or drink alcohol if taking narcotic pain medications.  Wound care: Remove dressing in 2 days unless otherwise instructed. Once dressing removed, 2 days after surgery (Sunday, 1/5), you may shower/get incision wet with soapy water and pat dry (do not rub incisions), but no baths or submerging incision underwater until follow-up.   Medications: Resume all home medications. For mild to moderate pain: acetaminophen (Tylenol) or ibuprofen/naproxen (if no kidney disease). Combining Tylenol with alcohol can substantially increase your risk of causing liver disease. Narcotic pain medications, if prescribed, can be used for severe pain, though may cause nausea, constipation, and drowsiness. Do not combine Tylenol and Ultracet (or similar) within a 6 hour period as Percocet (and similar) contain(s) Tylenol. If you do not need the narcotic pain medication, you do not need to fill the prescription.  Call office [phone]) at any time if any questions, worsening pain, fevers/chills, bleeding, drainage from incision site, or other concerns.   AMBULATORY SURGERY  DISCHARGE INSTRUCTIONS   1) The drugs that you were given will stay in your system until tomorrow so for the next 24 hours you should not:  A) Drive an automobile B) Make any legal decisions C) Drink any alcoholic beverage   2) You may resume regular meals tomorrow.  Today it is better to start with liquids and gradually work up to solid foods.  You may eat anything you prefer, but it is better to start with liquids, then soup and crackers, and gradually work up to solid foods.   3) Please  notify your doctor immediately if you have any unusual bleeding, trouble breathing, redness and pain at the surgery site, drainage, fever, or pain not relieved by medication.    4) Additional Instructions:        Please contact your physician with any problems or Same Day Surgery at [phone], Monday through Friday 6 am to 4 pm, or [place] at [facility] number at [phone].

## 2018-01-20 NOTE — Anesthesia Procedure Notes (Signed)
Procedure Name: Intubation Date/Time: 01/20/2018 8:16 AM Performed by: Marsh Dolly, CRNA Pre-anesthesia Checklist: Patient identified, Patient being monitored, Timeout performed, Emergency Drugs available and Suction available Patient Re-evaluated:Patient Re-evaluated prior to induction Oxygen Delivery Method: Circle system utilized Preoxygenation: Pre-oxygenation with 100% oxygen Induction Type: IV induction Ventilation: Mask ventilation without difficulty LMA Size: 3.0 Laryngoscope Size: 3 and Miller Grade View: Grade I Tube type: Oral Tube size: 7.5 mm Number of attempts: 1 Placement Confirmation: ETT inserted through vocal cords under direct vision,  positive ETCO2 and breath sounds checked- equal and bilateral Secured at: 21 cm Tube secured with: Tape Dental Injury: Teeth and Oropharynx as per pre-operative assessment

## 2018-01-23 LAB — AEROBIC/ANAEROBIC CULTURE W GRAM STAIN (SURGICAL/DEEP WOUND): Culture: NEGATIVE

## 2018-01-23 LAB — SURGICAL PATHOLOGY

## 2018-01-23 LAB — AEROBIC/ANAEROBIC CULTURE (SURGICAL/DEEP WOUND)

## 2018-01-24 ENCOUNTER — Encounter: Payer: Self-pay | Admitting: Family Medicine

## 2018-01-24 DIAGNOSIS — L602 Onychogryphosis: Secondary | ICD-10-CM

## 2018-01-24 DIAGNOSIS — L608 Other nail disorders: Secondary | ICD-10-CM

## 2018-01-24 NOTE — Anesthesia Postprocedure Evaluation (Signed)
Anesthesia Post Note  Patient: Anthony Greene  Procedure(s) Performed: EXCISION OF INCREASINGLY SYMPTOMATIC PILONIDAL CYST AND RIGHT OF UPPER BACK SEBACEOUS CYST (N/A )  Patient location during evaluation: PACU Anesthesia Type: General Level of consciousness: awake and alert Pain management: pain level controlled Vital Signs Assessment: post-procedure vital signs reviewed and stable Respiratory status: spontaneous breathing, nonlabored ventilation, respiratory function stable and patient connected to nasal cannula oxygen Cardiovascular status: blood pressure returned to baseline and stable Postop Assessment: no apparent nausea or vomiting Anesthetic complications: no     Last Vitals:  Vitals:   01/20/18 1053 01/20/18 1108  BP: 136/72 138/75  Pulse: (!) 58 60  Resp: 18 18  Temp: 36.5 C   SpO2: 97% 98%    Last Pain:  Vitals:   01/23/18 0826  TempSrc:   PainSc: 0-No pain                 Molli Barrows

## 2018-01-27 ENCOUNTER — Other Ambulatory Visit (INDEPENDENT_AMBULATORY_CARE_PROVIDER_SITE_OTHER): Payer: Medicare Other

## 2018-01-27 DIAGNOSIS — R972 Elevated prostate specific antigen [PSA]: Secondary | ICD-10-CM | POA: Diagnosis not present

## 2018-01-27 LAB — PSA: PSA: 3.52 ng/mL (ref 0.10–4.00)

## 2018-01-31 DIAGNOSIS — L723 Sebaceous cyst: Secondary | ICD-10-CM

## 2018-02-02 ENCOUNTER — Other Ambulatory Visit: Payer: Self-pay

## 2018-02-02 ENCOUNTER — Ambulatory Visit: Payer: Medicare Other | Admitting: Podiatry

## 2018-02-02 ENCOUNTER — Encounter: Payer: Self-pay | Admitting: Surgery

## 2018-02-02 ENCOUNTER — Ambulatory Visit (INDEPENDENT_AMBULATORY_CARE_PROVIDER_SITE_OTHER): Payer: Medicare Other | Admitting: Surgery

## 2018-02-02 VITALS — BP 130/78 | HR 62 | Temp 98.1°F | Resp 12 | Ht 70.0 in | Wt 212.0 lb

## 2018-02-02 DIAGNOSIS — Z4889 Encounter for other specified surgical aftercare: Secondary | ICD-10-CM

## 2018-02-02 DIAGNOSIS — L0591 Pilonidal cyst without abscess: Secondary | ICD-10-CM

## 2018-02-02 DIAGNOSIS — L723 Sebaceous cyst: Secondary | ICD-10-CM

## 2018-02-02 NOTE — Progress Notes (Signed)
Surgical Clinic Progress/Follow-up Note   HPI:  68 y.o. Male presents to clinic for post-op follow-up 13 Days s/p excision of his extensive persistently symptomatic (painful and draining) formerly infected recurrent pilonidal cyst and sinus along with excision of his painful Right mid-upper back sebaceous cyst Rosana Hoes, 01/20/2018). Patient reports his wife Investment banker, corporate) removed Penrose drain last week from his sacral-coccygeal wound as agreed upon with complete resolution of pre-operative pain. He states that he actually forgot he had a Right mid-upper back cyst excised at all. He otherwise denies any wound drainage, fever/chills, CP, or SOB.  Review of Systems:  Constitutional: denies fever/chills  Respiratory: denies shortness of breath, wheezing  Cardiovascular: denies chest pain, palpitations  Gastrointestinal: denies abdominal pain, N/V, or diarrhea Skin: Denies any other rashes or skin discolorations except post-surgical wounds as per interval history  Vital Signs:  BP 130/78   Pulse 62   Temp 98.1 F (36.7 C) (Skin)   Resp 12   Ht 5\' 10"  (1.778 m)   Wt 212 lb (96.2 kg)   SpO2 97%   BMI 30.42 kg/m    Physical Exam:  Constitutional:  -- Normal body habitus  -- Awake, alert, and oriented x3  Pulmonary:  -- No crackles -- Equal breath sounds bilaterally -- Breathing non-labored at rest Cardiovascular:  -- S1, S2 present  -- No pericardial rubs  Gastrointestinal:  -- Soft and non-distended, non-tender to palpation, no guarding/rebound tenderness -- No abdominal masses appreciated, pulsatile or otherwise  Musculoskeletal / Integumentary:  -- Wounds or skin discoloration: Sacral-coccygeal and Right mid-upper back post-surgical incisions both well-approximated except small focal opening where sacral-coccygeal Penrose drain was removed without any peri-incisional erythema or drainage -- Extremities: B/L UE and LE FROM, hands and feet warm, no edema   Imaging: No new pertinent  imaging available for review  Assessment:  68 y.o. yo Male with a problem list including...  Patient Active Problem List   Diagnosis Date Noted  . Sebaceous cyst   . Excessive cerumen in both ear canals 12/26/2017  . Hearing loss 12/26/2017  . Skin abnormalities 12/26/2017  . Benign prostatic hyperplasia with urinary hesitancy 12/26/2017  . Anemia 12/26/2017  . Elevated PSA, less than 10 ng/ml 12/26/2017  . Encounter for health maintenance examination with abnormal findings 12/21/2017  . Elevated BP without diagnosis of hypertension 07/11/2017  . Gluteal abscess   . Hematoma of hip, right, initial encounter 06/23/2017  . Pilonidal cyst 06/22/2017  . Daily consumption of alcohol 06/07/2017  . Injury resulting from fall from height 06/07/2017  . Intractable low back pain 06/07/2017  . Closed fracture of transverse process of lumbar vertebra ([place]) 06/06/2017  . Psoriatic arthritis ([place]) 01/19/1999    presents to clinic for post-op follow-up evaluation, doing well 13 Days s/p excision of his extensive persistently symptomatic (painful and draining) formerly infected recurrent pilonidal cyst and sinus along with excision of his painful Right mid-upper back sebaceous cyst Rosana Hoes, 01/20/2018).  Plan:              - diet as tolerated              - gradually resume all activities over next 2 weeks, after which no restrictions             - okay to shower, but do not submerge incisions under water (baths, swimming) until healed             - apply sunblock particularly to incisions with  sun exposure to reduce pigmentation of scars             - return to clinic as needed, instructed to call office if any questions or concerns  All of the above recommendations were discussed with the patient, and all of patient's questions were answered to his expressed satisfaction.  -- Marilynne Drivers Rosana Hoes, MD, [place]: [facility] - Partnering for exceptional  care. Office: [phone]

## 2018-02-02 NOTE — Patient Instructions (Addendum)
The patient is aware to call back for any questions or new concerns. Resume activities as tolerated.

## 2018-02-03 ENCOUNTER — Encounter: Payer: Self-pay | Admitting: Podiatry

## 2018-02-03 ENCOUNTER — Ambulatory Visit (INDEPENDENT_AMBULATORY_CARE_PROVIDER_SITE_OTHER): Payer: Medicare Other | Admitting: Podiatry

## 2018-02-03 VITALS — BP 146/78 | HR 63 | Resp 16

## 2018-02-03 DIAGNOSIS — M79609 Pain in unspecified limb: Secondary | ICD-10-CM

## 2018-02-03 DIAGNOSIS — M79676 Pain in unspecified toe(s): Secondary | ICD-10-CM

## 2018-02-03 DIAGNOSIS — L603 Nail dystrophy: Secondary | ICD-10-CM

## 2018-02-03 DIAGNOSIS — B351 Tinea unguium: Secondary | ICD-10-CM | POA: Diagnosis not present

## 2018-02-03 DIAGNOSIS — L409 Psoriasis, unspecified: Secondary | ICD-10-CM

## 2018-02-05 NOTE — Progress Notes (Signed)
  Subjective:  Patient ID: Anthony Greene, male    DOB: [birth year],  MRN: [id number]  Chief Complaint  Patient presents with  . Nail Problem    Toenails bilateral - thick and discolored x 2 years, has psoriatic arthritis and wonders if its a factor, no treatment other than trying to trim down  . New Patient (Initial Visit)    68 y.o. male presents with the above complaint.  Reports painfully elongated nails to both feet.  Endorses history of psoriasis  Review of Systems: Negative except as noted in the HPI. Denies N/V/F/Ch.  Past Medical History:  Diagnosis Date  . Arthritis   . Closed fracture of transverse process of lumbar vertebra ([place]) 06/06/2017  . Hematoma of hip, right, initial encounter 06/23/2017  . Injury resulting from fall from height 06/07/2017  . Intractable low back pain 06/07/2017  . Pilonidal abscess 06/22/2017  . Pilonidal cyst 06/22/2017  . Sebaceous cyst     Current Outpatient Medications:  .  celecoxib (CELEBREX) 200 MG capsule, Take 200 mg by mouth at bedtime. , Disp: , Rfl:  .  Melatonin 3 MG CAPS, Take 3 mg by mouth at bedtime. , Disp: , Rfl:  .  tamsulosin (FLOMAX) 0.4 MG CAPS capsule, Take 1 capsule (0.4 mg total) by mouth daily., Disp: 30 capsule, Rfl: 3  Social History   Tobacco Use  Smoking Status Former Smoker  . Types: Cigars  Smokeless Tobacco Never Used    Allergies  Allergen Reactions  . Dilaudid [Hydromorphone Hcl] Nausea And Vomiting  . Morphine And Related Nausea And Vomiting  . Vancomycin Other (See Comments)    redman syndrome  . Penicillins Rash    Patient has only had rash reaction to PCN in the past.  Patient and wife state he Tolerates keflex and  Ancef with no problem.  Has patient had a PCN reaction causing immediate rash, facial/tongue/throat swelling, SOB or lightheadedness with hypotension: Yes- rash only Has patient had a PCN reaction causing severe rash involving mucus membranes or skin necrosis: No Has patient had a PCN  reaction that required hospitalization: No Has patient had a PCN reaction occurring within the last 10 years: No   Objective:   Vitals:   02/03/18 0813  BP: (!) 146/78  Pulse: 63  Resp: 16   There is no height or weight on file to calculate BMI. Constitutional Well developed. Well nourished.  Vascular Dorsalis pedis pulses palpable bilaterally. Posterior tibial pulses palpable bilaterally. Capillary refill normal to all digits.  No cyanosis or clubbing noted. Pedal hair growth normal.  Neurologic Normal speech. Oriented to person, place, and time. Epicritic sensation to light touch grossly present bilaterally.  Dermatologic Nails elongated dystrophic pain to palpation.  Thickening and nail pitting noted No open wounds. No skin lesions.  Orthopedic: Normal joint ROM without pain or crepitus bilaterally. No visible deformities. No bony tenderness.   Radiographs: None Assessment:   1. Nail dystrophy   2. Pain due to onychomycosis of nail   3. Psoriasis    Plan:  Patient was evaluated and treated and all questions answered.  Onychomycosis with pain -Nails palliatively debridement as below -Educated on self-care -Dispense tolycen gel  Procedure: Nail Debridement Rationale: Pain Type of Debridement: manual, sharp debridement. Instrumentation: Nail nipper, rotary burr. Number of Nails: 10  Nail psoriasis -Discussed possible nailbed corticosteroid injection patient declined    No follow-ups on file.

## 2018-02-17 DIAGNOSIS — L4050 Arthropathic psoriasis, unspecified: Secondary | ICD-10-CM | POA: Diagnosis not present

## 2018-03-22 ENCOUNTER — Encounter: Payer: Self-pay | Admitting: Family Medicine

## 2018-03-22 DIAGNOSIS — N401 Enlarged prostate with lower urinary tract symptoms: Secondary | ICD-10-CM

## 2018-03-22 DIAGNOSIS — R3911 Hesitancy of micturition: Principal | ICD-10-CM

## 2018-03-23 ENCOUNTER — Ambulatory Visit (INDEPENDENT_AMBULATORY_CARE_PROVIDER_SITE_OTHER): Payer: Medicare Other | Admitting: Podiatry

## 2018-03-23 ENCOUNTER — Encounter: Payer: Self-pay | Admitting: Podiatry

## 2018-03-23 DIAGNOSIS — L409 Psoriasis, unspecified: Secondary | ICD-10-CM

## 2018-03-23 DIAGNOSIS — L603 Nail dystrophy: Secondary | ICD-10-CM

## 2018-03-23 DIAGNOSIS — M79609 Pain in unspecified limb: Secondary | ICD-10-CM | POA: Diagnosis not present

## 2018-03-23 DIAGNOSIS — B351 Tinea unguium: Secondary | ICD-10-CM

## 2018-03-23 MED ORDER — TAMSULOSIN HCL 0.4 MG PO CAPS: 0.4000 mg | ORAL_CAPSULE | Freq: Every day | ORAL | 3 refills | Status: DC

## 2018-03-23 NOTE — Progress Notes (Signed)
  Subjective:  Patient ID: Anthony Greene, male    DOB: November 04, 1950,  MRN: [id number]  Chief Complaint  Patient presents with  . Nail Problem    bilateral debridement of painful nails    68 y.o. male presents with the above complaint.  Thinks the tolcylen is helping.  Review of Systems: Negative except as noted in the HPI. Denies N/V/F/Ch.  Past Medical History:  Diagnosis Date  . Arthritis   . Closed fracture of transverse process of lumbar vertebra ([place]) 06/06/2017  . Hematoma of hip, right, initial encounter 06/23/2017  . Injury resulting from fall from height 06/07/2017  . Intractable low back pain 06/07/2017  . Pilonidal abscess 06/22/2017  . Pilonidal cyst 06/22/2017  . Sebaceous cyst     Current Outpatient Medications:  .  celecoxib (CELEBREX) 200 MG capsule, Take 200 mg by mouth at bedtime. , Disp: , Rfl:  .  Melatonin 3 MG CAPS, Take 3 mg by mouth at bedtime. , Disp: , Rfl:  .  tamsulosin (FLOMAX) 0.4 MG CAPS capsule, Take 1 capsule (0.4 mg total) by mouth daily., Disp: 30 capsule, Rfl: 3  Social History   Tobacco Use  Smoking Status Former Smoker  . Types: Cigars  Smokeless Tobacco Never Used    Allergies  Allergen Reactions  . Dilaudid [Hydromorphone Hcl] Nausea And Vomiting  . Morphine And Related Nausea And Vomiting  . Vancomycin Other (See Comments)    redman syndrome  . Penicillins Rash    Patient has only had rash reaction to PCN in the past.  Patient and wife state he Tolerates keflex and  Ancef with no problem.  Has patient had a PCN reaction causing immediate rash, facial/tongue/throat swelling, SOB or lightheadedness with hypotension: Yes- rash only Has patient had a PCN reaction causing severe rash involving mucus membranes or skin necrosis: No Has patient had a PCN reaction that required hospitalization: No Has patient had a PCN reaction occurring within the last 10 years: No   Objective:   There were no vitals filed for this visit. There is no height  or weight on file to calculate BMI. Constitutional Well developed. Well nourished.  Vascular Dorsalis pedis pulses palpable bilaterally. Posterior tibial pulses palpable bilaterally. Capillary refill normal to all digits.  No cyanosis or clubbing noted. Pedal hair growth normal.  Neurologic Normal speech. Oriented to person, place, and time. Epicritic sensation to light touch grossly present bilaterally.  Dermatologic Nails elongated dystrophic.  Thickening and nail pitting noted, proximal clearing. No open wounds. No skin lesions.  Orthopedic: Normal joint ROM without pain or crepitus bilaterally. No visible deformities. No bony tenderness.   Radiographs: None Assessment:   1. Nail dystrophy   2. Pain due to onychomycosis of nail   3. Psoriasis    Plan:  Patient was evaluated and treated and all questions answered.  Onychomycosis, Nail Psoriasis -Improving with tolcylen. Continue. -Nails courtesy debrided. -F/u as needed  Return if symptoms worsen or fail to improve.

## 2018-03-29 DIAGNOSIS — D489 Neoplasm of uncertain behavior, unspecified: Secondary | ICD-10-CM | POA: Diagnosis not present

## 2018-03-29 DIAGNOSIS — L570 Actinic keratosis: Secondary | ICD-10-CM | POA: Diagnosis not present

## 2018-03-29 DIAGNOSIS — L820 Inflamed seborrheic keratosis: Secondary | ICD-10-CM | POA: Diagnosis not present

## 2018-03-31 DIAGNOSIS — L814 Other melanin hyperpigmentation: Secondary | ICD-10-CM | POA: Diagnosis not present

## 2018-04-14 DIAGNOSIS — L4050 Arthropathic psoriasis, unspecified: Secondary | ICD-10-CM | POA: Diagnosis not present

## 2018-06-09 DIAGNOSIS — Z79899 Other long term (current) drug therapy: Secondary | ICD-10-CM | POA: Diagnosis not present

## 2018-06-09 DIAGNOSIS — L4050 Arthropathic psoriasis, unspecified: Secondary | ICD-10-CM | POA: Diagnosis not present

## 2018-07-07 DIAGNOSIS — M2550 Pain in unspecified joint: Secondary | ICD-10-CM | POA: Diagnosis not present

## 2018-07-07 DIAGNOSIS — M545 Low back pain: Secondary | ICD-10-CM | POA: Diagnosis not present

## 2018-07-07 DIAGNOSIS — M150 Primary generalized (osteo)arthritis: Secondary | ICD-10-CM | POA: Diagnosis not present

## 2018-07-07 DIAGNOSIS — E669 Obesity, unspecified: Secondary | ICD-10-CM | POA: Diagnosis not present

## 2018-07-07 DIAGNOSIS — Z79899 Other long term (current) drug therapy: Secondary | ICD-10-CM | POA: Diagnosis not present

## 2018-07-07 DIAGNOSIS — Z6830 Body mass index (BMI) 30.0-30.9, adult: Secondary | ICD-10-CM | POA: Diagnosis not present

## 2018-07-07 DIAGNOSIS — L409 Psoriasis, unspecified: Secondary | ICD-10-CM | POA: Diagnosis not present

## 2018-07-07 DIAGNOSIS — L4050 Arthropathic psoriasis, unspecified: Secondary | ICD-10-CM | POA: Diagnosis not present

## 2018-08-07 DIAGNOSIS — L4050 Arthropathic psoriasis, unspecified: Secondary | ICD-10-CM | POA: Diagnosis not present

## 2018-09-10 ENCOUNTER — Encounter: Payer: Self-pay | Admitting: Family Medicine

## 2018-09-10 DIAGNOSIS — M7918 Myalgia, other site: Secondary | ICD-10-CM

## 2018-09-12 DIAGNOSIS — Z23 Encounter for immunization: Secondary | ICD-10-CM | POA: Diagnosis not present

## 2018-09-12 MED ORDER — AMITRIPTYLINE HCL 25 MG PO TABS: 25.0000 mg | ORAL_TABLET | Freq: Every evening | ORAL | 2 refills | Status: DC | PRN

## 2018-10-02 DIAGNOSIS — L4050 Arthropathic psoriasis, unspecified: Secondary | ICD-10-CM | POA: Diagnosis not present

## 2018-10-30 ENCOUNTER — Encounter: Payer: Self-pay | Admitting: Family Medicine

## 2018-10-30 ENCOUNTER — Ambulatory Visit (INDEPENDENT_AMBULATORY_CARE_PROVIDER_SITE_OTHER): Payer: Medicare Other | Admitting: Family Medicine

## 2018-10-30 ENCOUNTER — Other Ambulatory Visit: Payer: Self-pay

## 2018-10-30 DIAGNOSIS — R519 Headache, unspecified: Secondary | ICD-10-CM | POA: Diagnosis not present

## 2018-10-30 DIAGNOSIS — J069 Acute upper respiratory infection, unspecified: Secondary | ICD-10-CM | POA: Diagnosis not present

## 2018-10-30 DIAGNOSIS — R509 Fever, unspecified: Secondary | ICD-10-CM | POA: Diagnosis not present

## 2018-10-30 DIAGNOSIS — R05 Cough: Secondary | ICD-10-CM | POA: Diagnosis not present

## 2018-10-30 DIAGNOSIS — U071 COVID-19: Secondary | ICD-10-CM | POA: Diagnosis not present

## 2018-10-30 MED ORDER — PREDNISONE 10 MG PO TABS: 10.0000 mg | ORAL_TABLET | Freq: Two times a day (BID) | ORAL | 0 refills | Status: AC

## 2018-10-30 NOTE — Progress Notes (Signed)
Established Patient Office Visit  Subjective:  Patient ID: Anthony Greene, male    DOB: 06-Mar-1950  Age: 68 y.o. MRN: [id number]  CC:  Chief Complaint  Patient presents with  . covid symptoms    HPI Anthony Greene presents for 7-day history of URI symptoms nasal congestion, malaise fatigue and myalgias.  More recently he has developed bitemporal headache.  He denies postnasal drip or rhinorrhea.  Temperature has been right at 100.  Denies cough shortness of breath or difficulty breathing.  There is been no aberration of taste or smell.  Wife with similar symptoms.  Her started more recently.  He has no history of asthma or difficulty breathing.  He was seen in urgent care and received testing for the Covid virus.  Patient admits to some neck pain.  This is not new to him.  He has a history of arthritis in his neck.  Symptoms were relieved with ibuprofen.  He is currently receiving Simponi Aria infusions and was advised to avoid nonsteroidals and Tylenol.  Patient's O2 sats have been running around 98% per wife who is a Therapist, sports.   Past Medical History:  Diagnosis Date  . Arthritis   . Closed fracture of transverse process of lumbar vertebra ([place]) 06/06/2017  . Hematoma of hip, right, initial encounter 06/23/2017  . Injury resulting from fall from height 06/07/2017  . Intractable low back pain 06/07/2017  . Pilonidal abscess 06/22/2017  . Pilonidal cyst 06/22/2017  . Sebaceous cyst     Past Surgical History:  Procedure Laterality Date  . CARPAL TUNNEL RELEASE Right   . COLONOSCOPY WITH PROPOFOL    . DRESSING CHANGE UNDER ANESTHESIA N/A 06/25/2017   Procedure: DRESSING CHANGE UNDER ANESTHESIA;  Surgeon: Florene Glen, MD;  Location: [facility];  Service: General;  Laterality: N/A;  . IRRIGATION AND DEBRIDEMENT BUTTOCKS Right 06/23/2017   Procedure: IRRIGATION AND DEBRIDEMENT BUTTOCKS;  Surgeon: Vickie Epley, MD;  Location: [facility];  Service: General;  Laterality: Right;  . JOINT REPLACEMENT      BL knee  . KNEE ARTHROSCOPY Right   . PILONIDAL CYST EXCISION N/A 01/20/2018   Procedure: EXCISION OF INCREASINGLY SYMPTOMATIC PILONIDAL CYST AND RIGHT OF UPPER BACK SEBACEOUS CYST;  Surgeon: Vickie Epley, MD;  Location: [facility];  Service: General;  Laterality: N/A;  . TONSILLECTOMY      Family History  Problem Relation Age of Onset  . Asthma Mother   . COPD Mother   . Stroke Father   . Cancer Brother     Social History   Socioeconomic History  . Marital status: Married    Spouse name: Not on file  . Number of children: Not on file  . Years of education: Not on file  . Highest education level: Not on file  Occupational History  . Not on file  Social Needs  . Financial resource strain: Not on file  . Food insecurity    Worry: Not on file    Inability: Not on file  . Transportation needs    Medical: Not on file    Non-medical: Not on file  Tobacco Use  . Smoking status: Former Smoker    Types: Cigars  . Smokeless tobacco: Never Used  Substance and Sexual Activity  . Alcohol use: Yes    Comment: 1-3 alcoholic drinks daily  . Drug use: Never  . Sexual activity: Yes  Lifestyle  . Physical activity    Days per week: Not on file  Minutes per session: Not on file  . Stress: Not on file  Relationships  . Social Herbalist on phone: Not on file    Gets together: Not on file    Attends religious service: Not on file    Active member of club or organization: Not on file    Attends meetings of clubs or organizations: Not on file    Relationship status: Not on file  . Intimate partner violence    Fear of current or ex partner: Not on file    Emotionally abused: Not on file    Physically abused: Not on file    Forced sexual activity: Not on file  Other Topics Concern  . Not on file  Social History Narrative  . Not on file    Outpatient Medications Prior to Visit  Medication Sig Dispense Refill  . amitriptyline (ELAVIL) 25 MG tablet Take 1 tablet  (25 mg total) by mouth at bedtime as needed for sleep. 30 tablet 2  . celecoxib (CELEBREX) 200 MG capsule Take 200 mg by mouth at bedtime.     . Melatonin 3 MG CAPS Take 3 mg by mouth at bedtime.     . tamsulosin (FLOMAX) 0.4 MG CAPS capsule Take 1 capsule (0.4 mg total) by mouth daily. 90 capsule 3   No facility-administered medications prior to visit.     Allergies  Allergen Reactions  . Dilaudid [Hydromorphone Hcl] Nausea And Vomiting  . Morphine And Related Nausea And Vomiting  . Vancomycin Other (See Comments)    redman syndrome  . Penicillins Rash    Patient has only had rash reaction to PCN in the past.  Patient and wife state he Tolerates keflex and  Ancef with no problem.  Has patient had a PCN reaction causing immediate rash, facial/tongue/throat swelling, SOB or lightheadedness with hypotension: Yes- rash only Has patient had a PCN reaction causing severe rash involving mucus membranes or skin necrosis: No Has patient had a PCN reaction that required hospitalization: No Has patient had a PCN reaction occurring within the last 10 years: No    ROS Review of Systems  Constitutional: Negative for chills, diaphoresis, fatigue, fever and unexpected weight change.  HENT: Positive for congestion, sinus pressure and sinus pain. Negative for postnasal drip and rhinorrhea.   Eyes: Negative for photophobia and visual disturbance.  Respiratory: Negative for cough, shortness of breath and wheezing.   Cardiovascular: Negative.   Gastrointestinal: Negative.   Genitourinary: Negative.   Skin: Negative for pallor and rash.  Neurological: Positive for headaches.  Hematological: Negative.   Psychiatric/Behavioral: Negative.       Objective:    Physical Exam  Constitutional: He is oriented to person, place, and time. He appears well-developed and well-nourished. No distress.  HENT:  Head: Normocephalic and atraumatic.  Right Ear: External ear normal.  Left Ear: External ear  normal.  Mouth/Throat: Oropharynx is clear and moist. No oropharyngeal exudate.  Eyes: Pupils are equal, round, and reactive to light. Conjunctivae are normal. Right eye exhibits no discharge. Left eye exhibits no discharge. No scleral icterus.  Neck: Neck supple. No JVD present. No tracheal deviation present. No thyromegaly present.  Pulmonary/Chest: Effort normal and breath sounds normal. No stridor.  Musculoskeletal:     Cervical back: He exhibits normal range of motion.  Lymphadenopathy:    He has no cervical adenopathy.  Neurological: He is alert and oriented to person, place, and time.  Skin: Skin is warm and dry. He  is not diaphoretic.     Psychiatric: He has a normal mood and affect. His speech is normal and behavior is normal.    There were no vitals taken for this visit. Wt Readings from Last 3 Encounters:  02/02/18 212 lb (96.2 kg)  01/20/18 214 lb 8.1 oz (97.3 kg)  01/09/18 214 lb 11.2 oz (97.4 kg)   BP Readings from Last 3 Encounters:  02/03/18 (!) 146/78  02/02/18 130/78  01/20/18 138/75   Guideline developer:  UpToDate (see UpToDate for funding source) Date Released: June 2014  Health Maintenance Due  Topic Date Due  . Hepatitis C Screening  12-03-1950  . TETANUS/TDAP  07/31/1968  . INFLUENZA VACCINE  08/19/2018    There are no preventive care reminders to display for this patient.  No results found for: TSH Lab Results  Component Value Date   WBC 4.8 12/22/2017   HGB 15.4 12/22/2017   HCT 45.6 12/22/2017   MCV 92.1 12/22/2017   PLT 265.0 12/22/2017   Lab Results  Component Value Date   NA 137 12/22/2017   K 4.4 12/22/2017   CO2 29 12/22/2017   GLUCOSE 93 12/22/2017   BUN 16 12/22/2017   CREATININE 0.99 12/22/2017   BILITOT 0.9 12/22/2017   ALKPHOS 47 12/22/2017   AST 16 12/22/2017   ALT 14 12/22/2017   PROT 6.8 12/22/2017   ALBUMIN 4.3 12/22/2017   CALCIUM 9.5 12/22/2017   ANIONGAP 9 06/23/2017   GFR 79.81 12/22/2017   Lab Results   Component Value Date   CHOL 183 12/22/2017   Lab Results  Component Value Date   HDL 84.00 12/22/2017   Lab Results  Component Value Date   LDLCALC 88 12/22/2017   Lab Results  Component Value Date   TRIG 52.0 12/22/2017   Lab Results  Component Value Date   CHOLHDL 2 12/22/2017   No results found for: HGBA1C    Assessment & Plan:   Problem List Items Addressed This Visit      Respiratory   Viral upper respiratory tract infection - Primary      Meds ordered this encounter  Medications  . predniSONE (DELTASONE) 10 MG tablet    Sig: Take 1 tablet (10 mg total) by mouth 2 (two) times daily with a meal for 5 days.    Dispense:  10 tablet    Refill:  0    Follow-up: Return in about 4 days (around 11/03/2018).   Patient with ongoing viral syndrome.  I have sent him a prescription for low-dose prednisone for 5 days.  Scheduled for follow-up appointment on Friday.  They will let me know the results of their Covid testing.  Virtual Visit via Video Note  I connected with Anthony Greene on 10/30/18 at  3:00 PM EDT by a video enabled telemedicine application and verified that I am speaking with the correct person using two identifiers.  Location: Patient: home Provider:    I discussed the limitations of evaluation and management by telemedicine and the availability of in person appointments. The patient expressed understanding and agreed to proceed.  History of Present Illness:    Observations/Objective:   Assessment and Plan:   Follow Up Instructions:    I discussed the assessment and treatment plan with the patient. The patient was provided an opportunity to ask questions and all were answered. The patient agreed with the plan and demonstrated an understanding of the instructions.   The patient was advised to call back or seek  an in-person evaluation if the symptoms worsen or if the condition fails to improve as anticipated.  I provided 2o minutes of  non-face-to-face time during this encounter.   Libby Maw, MD

## 2018-11-01 ENCOUNTER — Encounter: Payer: Self-pay | Admitting: Family Medicine

## 2018-11-03 ENCOUNTER — Encounter: Payer: Self-pay | Admitting: Family Medicine

## 2018-11-03 ENCOUNTER — Ambulatory Visit (INDEPENDENT_AMBULATORY_CARE_PROVIDER_SITE_OTHER): Payer: Medicare Other | Admitting: Family Medicine

## 2018-11-03 ENCOUNTER — Other Ambulatory Visit: Payer: Self-pay

## 2018-11-03 DIAGNOSIS — U071 COVID-19: Secondary | ICD-10-CM

## 2018-11-03 NOTE — Progress Notes (Signed)
Established Patient Office Visit  Subjective:  Patient ID: Anthony Greene, male    DOB: [birth year]  Age: 68 y.o. MRN: [id number]  CC: No chief complaint on file.   HPI Anthony Greene presents for follow-up of his viral infection.  Testing came back positive for the Covid virus.  His wife's testing was positive as well.  He is improving.  His energy is returning slowly but he remains fatigued.  He continues to rest and take it easy.  He has had a frontal headache that is moved up to the top of his head.  He denies any rhinorrhea or postnasal drip.  Denies cough fever or chills.  O2 sats have been in the 98% range.  He is improving.   Past Medical History:  Diagnosis Date  . Arthritis   . Closed fracture of transverse process of lumbar vertebra ([place]) 06/06/2017  . Hematoma of hip, right, initial encounter 06/23/2017  . Injury resulting from fall from height 06/07/2017  . Intractable low back pain 06/07/2017  . Pilonidal abscess 06/22/2017  . Pilonidal cyst 06/22/2017  . Sebaceous cyst     Past Surgical History:  Procedure Laterality Date  . CARPAL TUNNEL RELEASE Right   . COLONOSCOPY WITH PROPOFOL    . DRESSING CHANGE UNDER ANESTHESIA N/A 06/25/2017   Procedure: DRESSING CHANGE UNDER ANESTHESIA;  Surgeon: Florene Glen, MD;  Location: [facility];  Service: General;  Laterality: N/A;  . IRRIGATION AND DEBRIDEMENT BUTTOCKS Right 06/23/2017   Procedure: IRRIGATION AND DEBRIDEMENT BUTTOCKS;  Surgeon: Vickie Epley, MD;  Location: [facility];  Service: General;  Laterality: Right;  . JOINT REPLACEMENT     BL knee  . KNEE ARTHROSCOPY Right   . PILONIDAL CYST EXCISION N/A 01/20/2018   Procedure: EXCISION OF INCREASINGLY SYMPTOMATIC PILONIDAL CYST AND RIGHT OF UPPER BACK SEBACEOUS CYST;  Surgeon: Vickie Epley, MD;  Location: [facility];  Service: General;  Laterality: N/A;  . TONSILLECTOMY      Family History  Problem Relation Age of Onset  . Asthma Mother   . COPD Mother   . Stroke Father    . Cancer Brother     Social History   Socioeconomic History  . Marital status: Married    Spouse name: Not on file  . Number of children: Not on file  . Years of education: Not on file  . Highest education level: Not on file  Occupational History  . Not on file  Social Needs  . Financial resource strain: Not on file  . Food insecurity    Worry: Not on file    Inability: Not on file  . Transportation needs    Medical: Not on file    Non-medical: Not on file  Tobacco Use  . Smoking status: Former Smoker    Types: Cigars  . Smokeless tobacco: Never Used  Substance and Sexual Activity  . Alcohol use: Yes    Comment: 1-3 alcoholic drinks daily  . Drug use: Never  . Sexual activity: Yes  Lifestyle  . Physical activity    Days per week: Not on file    Minutes per session: Not on file  . Stress: Not on file  Relationships  . Social Herbalist on phone: Not on file    Gets together: Not on file    Attends religious service: Not on file    Active member of club or organization: Not on file    Attends meetings of clubs or  organizations: Not on file    Relationship status: Not on file  . Intimate partner violence    Fear of current or ex partner: Not on file    Emotionally abused: Not on file    Physically abused: Not on file    Forced sexual activity: Not on file  Other Topics Concern  . Not on file  Social History Narrative  . Not on file    Outpatient Medications Prior to Visit  Medication Sig Dispense Refill  . amitriptyline (ELAVIL) 25 MG tablet Take 1 tablet (25 mg total) by mouth at bedtime as needed for sleep. 30 tablet 2  . celecoxib (CELEBREX) 200 MG capsule Take 200 mg by mouth at bedtime.     . Melatonin 3 MG CAPS Take 3 mg by mouth at bedtime.     . predniSONE (DELTASONE) 10 MG tablet Take 1 tablet (10 mg total) by mouth 2 (two) times daily with a meal for 5 days. 10 tablet 0  . tamsulosin (FLOMAX) 0.4 MG CAPS capsule Take 1 capsule (0.4 mg  total) by mouth daily. 90 capsule 3   No facility-administered medications prior to visit.     Allergies  Allergen Reactions  . Dilaudid [Hydromorphone Hcl] Nausea And Vomiting  . Morphine And Related Nausea And Vomiting  . Vancomycin Other (See Comments)    redman syndrome  . Penicillins Rash    Patient has only had rash reaction to PCN in the past.  Patient and wife state he Tolerates keflex and  Ancef with no problem.  Has patient had a PCN reaction causing immediate rash, facial/tongue/throat swelling, SOB or lightheadedness with hypotension: Yes- rash only Has patient had a PCN reaction causing severe rash involving mucus membranes or skin necrosis: No Has patient had a PCN reaction that required hospitalization: No Has patient had a PCN reaction occurring within the last 10 years: No    ROS Review of Systems  Constitutional: Negative for chills, diaphoresis, fatigue, fever and unexpected weight change.  HENT: Negative for postnasal drip, rhinorrhea and sore throat.   Eyes: Negative for photophobia and visual disturbance.  Respiratory: Negative.  Negative for cough, shortness of breath and wheezing.   Cardiovascular: Negative.   Gastrointestinal: Negative.   Musculoskeletal: Negative for arthralgias and myalgias.  Neurological: Positive for headaches.  Hematological: Does not bruise/bleed easily.  Psychiatric/Behavioral: Negative.       Objective:    Physical Exam  Constitutional: He is oriented to person, place, and time. He appears well-developed and well-nourished. No distress.  HENT:  Head: Normocephalic and atraumatic.  Right Ear: External ear normal.  Left Ear: External ear normal.  Eyes: Right eye exhibits no discharge. Left eye exhibits no discharge. No scleral icterus.  Neck: No JVD present. No tracheal deviation present.  Pulmonary/Chest: Effort normal. No stridor.  Neurological: He is alert and oriented to person, place, and time.  Skin: He is not  diaphoretic.  Psychiatric: He has a normal mood and affect. His behavior is normal.    There were no vitals taken for this visit. Wt Readings from Last 3 Encounters:  02/02/18 212 lb (96.2 kg)  01/20/18 214 lb 8.1 oz (97.3 kg)  01/09/18 214 lb 11.2 oz (97.4 kg)   BP Readings from Last 3 Encounters:  02/03/18 (!) 146/78  02/02/18 130/78  01/20/18 138/75   Guideline developer:  UpToDate (see UpToDate for funding source) Date Released: June 2014  Health Maintenance Due  Topic Date Due  . Hepatitis C Screening  03-Oct-1950  . TETANUS/TDAP  07/31/1968  . INFLUENZA VACCINE  08/19/2018    There are no preventive care reminders to display for this patient.  No results found for: TSH Lab Results  Component Value Date   WBC 4.8 12/22/2017   HGB 15.4 12/22/2017   HCT 45.6 12/22/2017   MCV 92.1 12/22/2017   PLT 265.0 12/22/2017   Lab Results  Component Value Date   NA 137 12/22/2017   K 4.4 12/22/2017   CO2 29 12/22/2017   GLUCOSE 93 12/22/2017   BUN 16 12/22/2017   CREATININE 0.99 12/22/2017   BILITOT 0.9 12/22/2017   ALKPHOS 47 12/22/2017   AST 16 12/22/2017   ALT 14 12/22/2017   PROT 6.8 12/22/2017   ALBUMIN 4.3 12/22/2017   CALCIUM 9.5 12/22/2017   ANIONGAP 9 06/23/2017   GFR 79.81 12/22/2017   Lab Results  Component Value Date   CHOL 183 12/22/2017   Lab Results  Component Value Date   HDL 84.00 12/22/2017   Lab Results  Component Value Date   LDLCALC 88 12/22/2017   Lab Results  Component Value Date   TRIG 52.0 12/22/2017   Lab Results  Component Value Date   CHOLHDL 2 12/22/2017   No results found for: HGBA1C    Assessment & Plan:   Problem List Items Addressed This Visit    None      No orders of the defined types were placed in this encounter.   Follow-up: No follow-ups on file.   We will continue to rest and take it easy.  He will let me know if he does not continue to improve over the next week or 2.  Advised him to go ahead with  the Covid vaccine when it becomes available. Virtual Visit via Video Note  I connected with [name] on 11/03/18 at  3:00 PM EDT by a video enabled telemedicine application and verified that I am speaking with the correct person using two identifiers.  Location: Patient: home Provider:    I discussed the limitations of evaluation and management by telemedicine and the availability of in person appointments. The patient expressed understanding and agreed to proceed.  History of Present Illness:    Observations/Objective:   Assessment and Plan:   Follow Up Instructions:    I discussed the assessment and treatment plan with the patient. The patient was provided an opportunity to ask questions and all were answered. The patient agreed with the plan and demonstrated an understanding of the instructions.   The patient was advised to call back or seek an in-person evaluation if the symptoms worsen or if the condition fails to improve as anticipated.  I provided 15 minutes of non-face-to-face time during this encounter.   Libby Maw, MD

## 2018-11-07 ENCOUNTER — Other Ambulatory Visit: Payer: Self-pay

## 2018-11-07 MED ORDER — PREDNISONE 10 MG PO TABS: 10.0000 mg | ORAL_TABLET | Freq: Every day | ORAL | 0 refills | Status: AC

## 2018-11-10 ENCOUNTER — Encounter: Payer: Self-pay | Admitting: Family Medicine

## 2018-12-04 DIAGNOSIS — M545 Low back pain: Secondary | ICD-10-CM | POA: Diagnosis not present

## 2018-12-04 DIAGNOSIS — L409 Psoriasis, unspecified: Secondary | ICD-10-CM | POA: Diagnosis not present

## 2018-12-04 DIAGNOSIS — L4050 Arthropathic psoriasis, unspecified: Secondary | ICD-10-CM | POA: Diagnosis not present

## 2018-12-04 DIAGNOSIS — R202 Paresthesia of skin: Secondary | ICD-10-CM | POA: Diagnosis not present

## 2018-12-04 DIAGNOSIS — Z79899 Other long term (current) drug therapy: Secondary | ICD-10-CM | POA: Diagnosis not present

## 2018-12-04 DIAGNOSIS — M150 Primary generalized (osteo)arthritis: Secondary | ICD-10-CM | POA: Diagnosis not present

## 2018-12-04 DIAGNOSIS — M2550 Pain in unspecified joint: Secondary | ICD-10-CM | POA: Diagnosis not present

## 2018-12-07 DIAGNOSIS — L4050 Arthropathic psoriasis, unspecified: Secondary | ICD-10-CM | POA: Diagnosis not present

## 2019-01-29 IMAGING — CT CT PELVIS W/ CM
2 of 3 series · 17 of 46 positions shown, 19 images · IV contrast (omnipaque)
Comparison: Pelvis radiograph dated 06/06/2017.

CLINICAL DATA: Buttocks abscess. The patient had a pilonidal cyst
lanced and packed 4 days ago with drainage of a hematoma in that
area. Increased swelling and pain in that region.

EXAM:
CT PELVIS WITH CONTRAST
TECHNIQUE: Multidetector CT imaging of the pelvis was performed using the
standard protocol following the bolus administration of intravenous
contrast.
CONTRAST:  100mL OMNIPAQUE IOHEXOL 300 MG/ML  SOLN

[Series 2: axial st · axial · 0.96mm/px · z∈[-387,-62]mm · 14 of 75 slices shown, 16 images]
[im 5/75  soft-tissue]
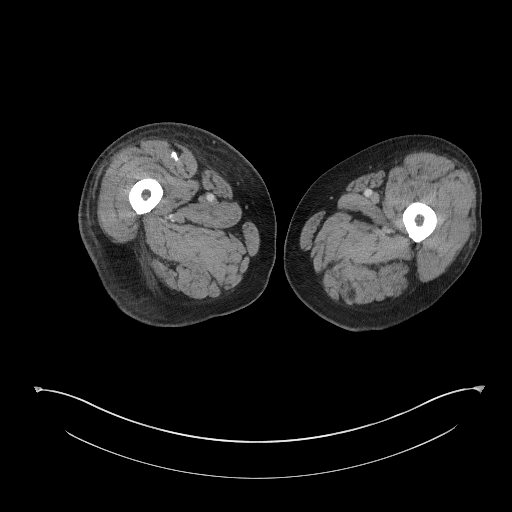
[im 5/75  bone]
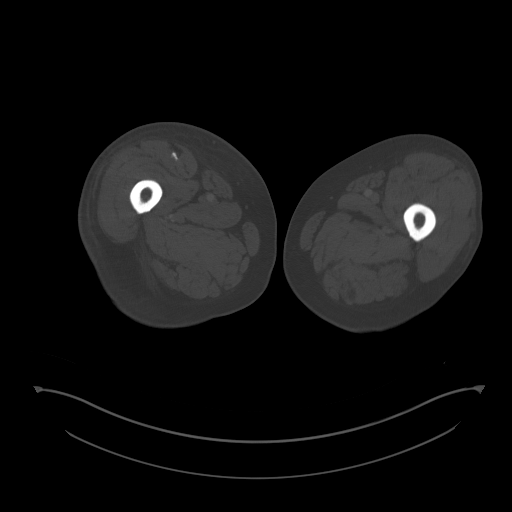
[im 10/75  soft-tissue]
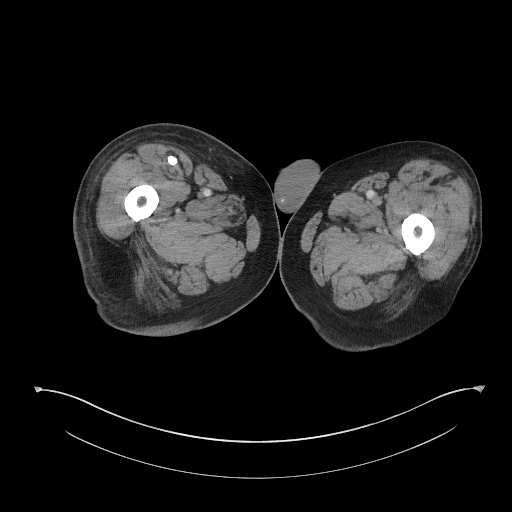
[im 15/75  soft-tissue]
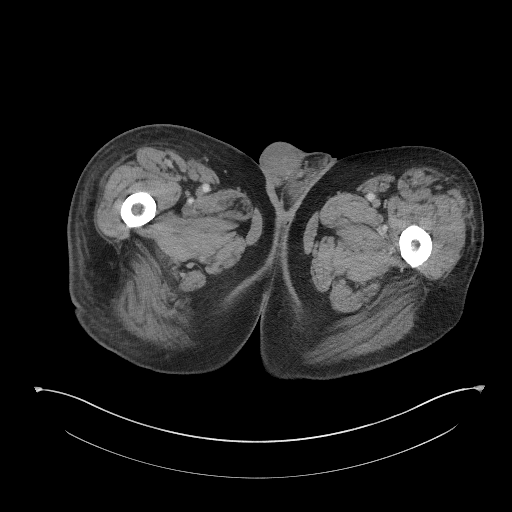
[im 20/75  soft-tissue]
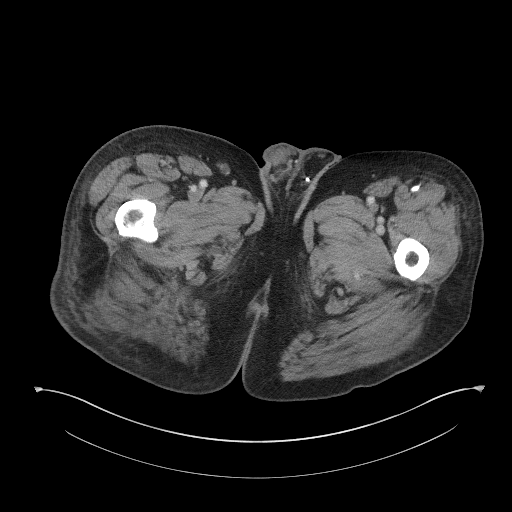
[im 24/75  soft-tissue]
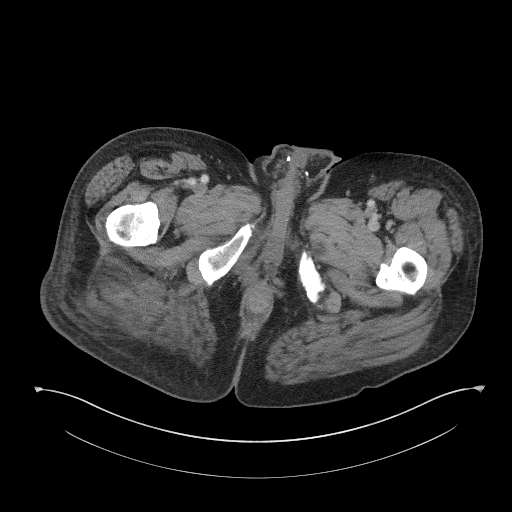
[im 29/75  soft-tissue]
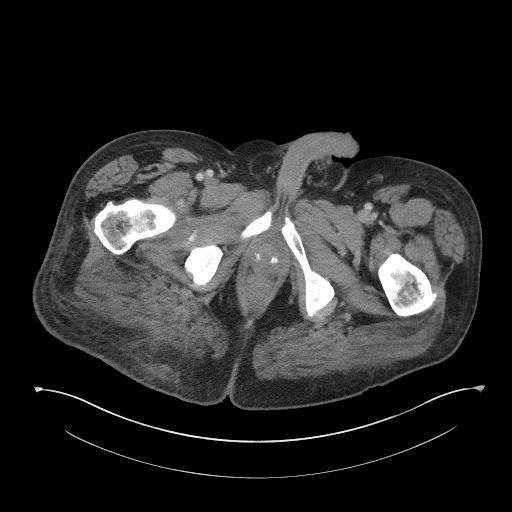
[im 34/75  soft-tissue]
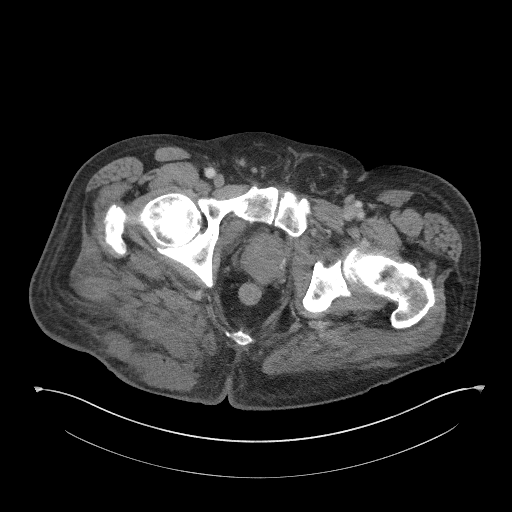
[im 41/75  soft-tissue]
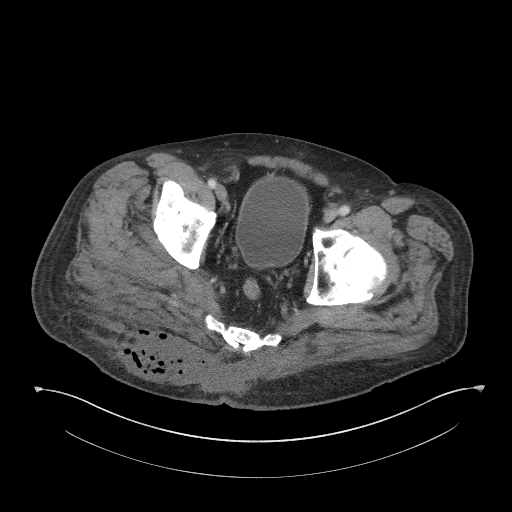
[im 46/75  soft-tissue]
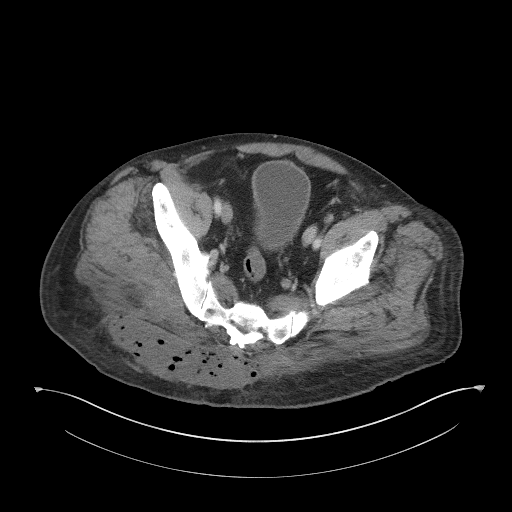
[im 46/75  bone]
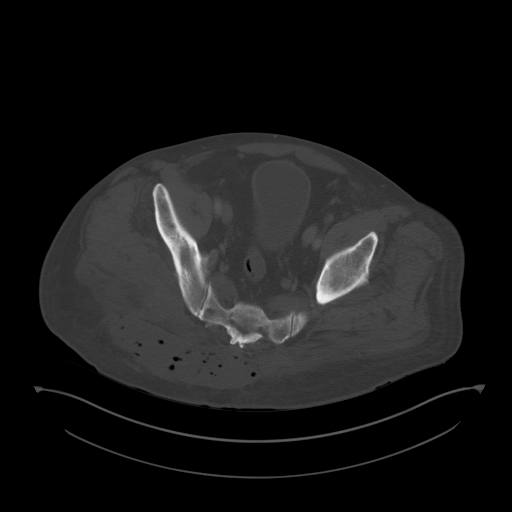
[im 51/75  soft-tissue]
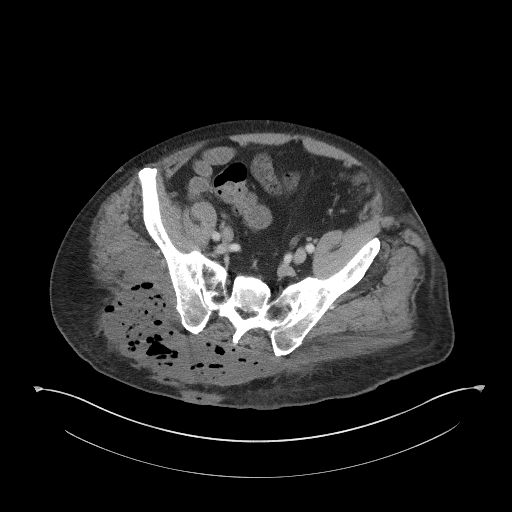
[im 55/75  soft-tissue]
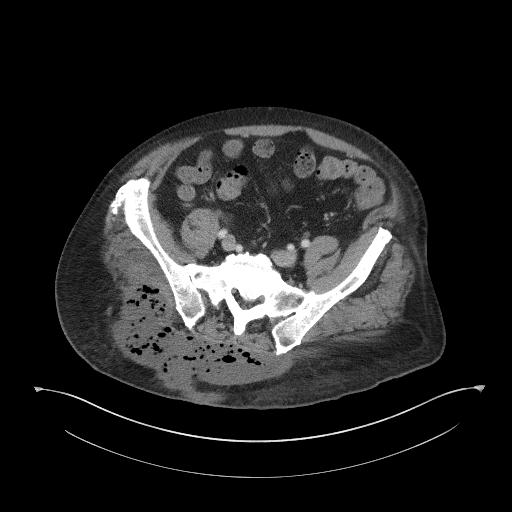
[im 60/75  soft-tissue]
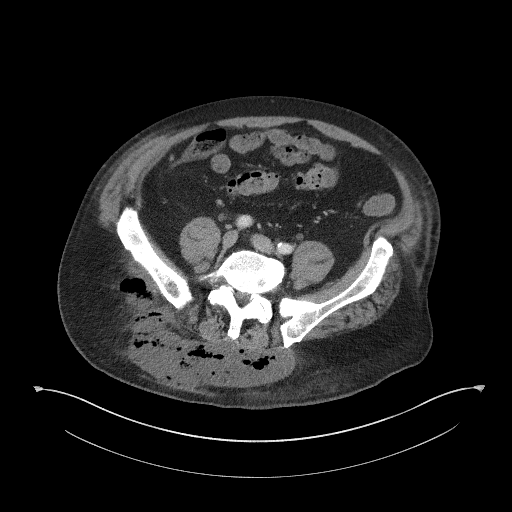
[im 65/75  soft-tissue]
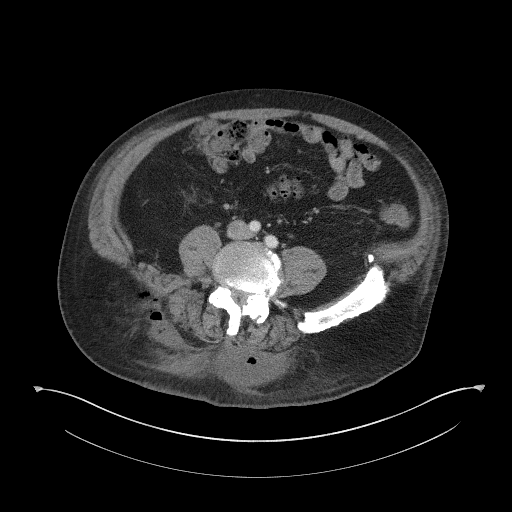
[im 70/75  soft-tissue]
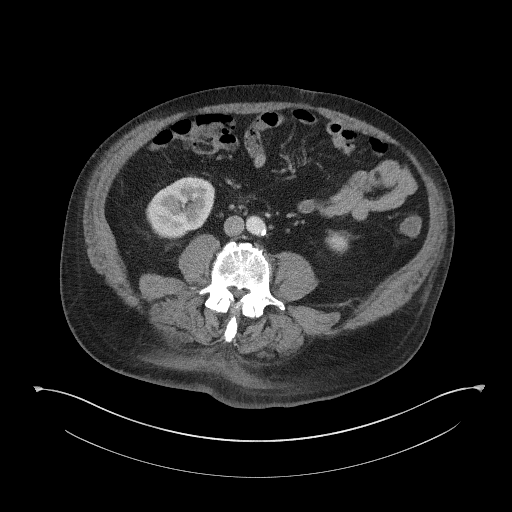

[Series 4: coronal st · coronal · 0.78mm/px · 3 of 108 slices shown]
[im 36/108  soft-tissue]
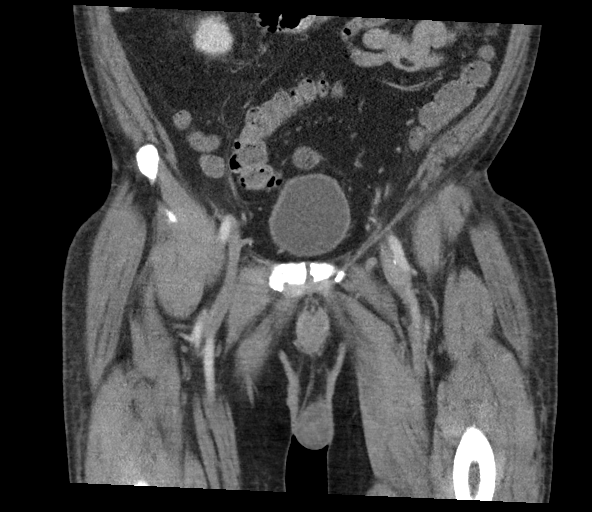
[im 48/108  soft-tissue]
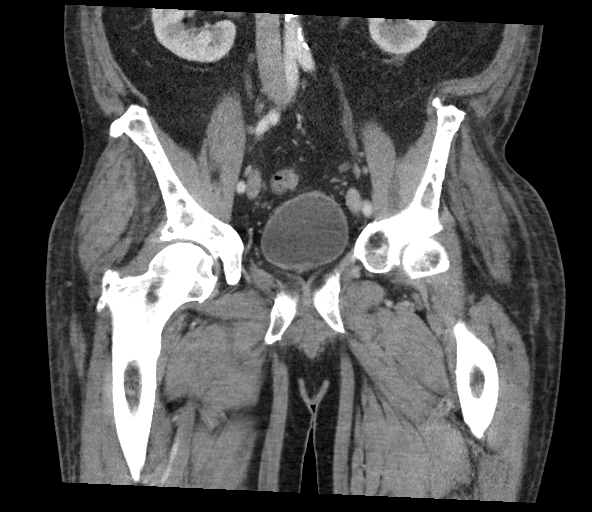
[im 60/108  soft-tissue]
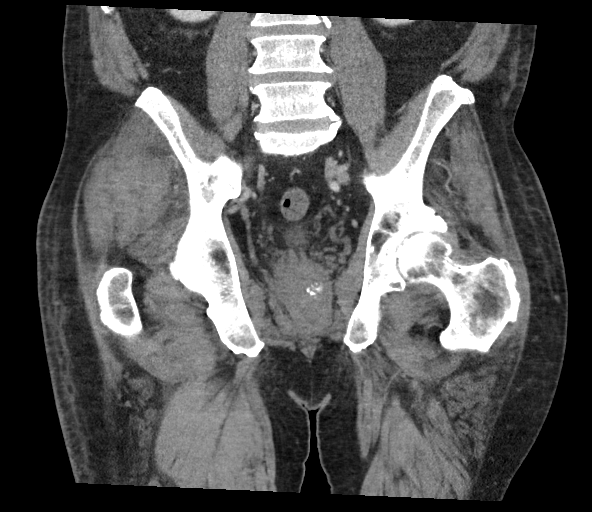

[17 of 46 positions shown; findings below may reference images not displayed]

FINDINGS: Urinary Tract: Small right renal parapelvic cysts. Unremarkable
lower pole of the left kidney. The ureters and urinary bladder
appear normal.

Bowel: Normal appearing appendix and included portions of the colon
and small bowel.

Vascular/Lymphatic: Atheromatous arterial calcifications without
aneurysm. No enlarged lymph nodes.

Reproductive:  Mildly enlarged and calcified prostate gland.

Other: Large collection of fluid and gas posterior to the sacrum and
extending into or indenting into the posterior aspect of the right
gluteus medius muscle this measures 17.4 x 7.5 cm on image number 24
series 2. This measures 15.1 cm in length on coronal image number
95.

Musculoskeletal: No bone destruction or periosteal reaction. Lower
lumbar spine degenerative changes. Mild bilateral hip degenerative
changes.
IMPRESSION: 17.4 x 15.1 x 7.5 cm abscess posterior to the sacrum and extending
into the posterior aspect of the right buttocks, as described above.

## 2019-01-30 ENCOUNTER — Other Ambulatory Visit: Payer: Self-pay | Admitting: Family Medicine

## 2019-01-30 DIAGNOSIS — N401 Enlarged prostate with lower urinary tract symptoms: Secondary | ICD-10-CM

## 2019-01-31 ENCOUNTER — Encounter: Payer: Self-pay | Admitting: Family Medicine

## 2019-01-31 DIAGNOSIS — R3911 Hesitancy of micturition: Secondary | ICD-10-CM

## 2019-01-31 DIAGNOSIS — N401 Enlarged prostate with lower urinary tract symptoms: Secondary | ICD-10-CM

## 2019-02-01 ENCOUNTER — Telehealth: Payer: Self-pay

## 2019-02-01 DIAGNOSIS — R3911 Hesitancy of micturition: Secondary | ICD-10-CM

## 2019-02-01 DIAGNOSIS — N401 Enlarged prostate with lower urinary tract symptoms: Secondary | ICD-10-CM

## 2019-02-01 MED ORDER — TAMSULOSIN HCL 0.4 MG PO CAPS: 0.4000 mg | ORAL_CAPSULE | Freq: Every day | ORAL | 0 refills | Status: DC

## 2019-02-01 NOTE — Telephone Encounter (Signed)
Per Dr. Ethelene Hal okay to refill medication (Flomax) pt aware that Rx sent to be mailed.

## 2019-02-17 ENCOUNTER — Ambulatory Visit: Payer: Medicare Other

## 2019-02-20 DIAGNOSIS — L4050 Arthropathic psoriasis, unspecified: Secondary | ICD-10-CM | POA: Diagnosis not present

## 2019-02-24 ENCOUNTER — Ambulatory Visit: Payer: Medicare Other | Attending: Internal Medicine

## 2019-02-24 DIAGNOSIS — Z23 Encounter for immunization: Secondary | ICD-10-CM | POA: Insufficient documentation

## 2019-02-24 NOTE — Progress Notes (Signed)
   Covid-[facility] Vaccination [facility]  Name:  Anthony Greene    MRN: [id number] DOB: [birth year]  02/24/2019  Anthony Greene was observed post Covid-19 immunization for 15 minutes without incidence. He was provided with Vaccine Information Sheet and instruction to access the V-Safe system.   Anthony Greene was instructed to call 911 with any severe reactions post vaccine: Marland Kitchen Difficulty breathing  . Swelling of your face and throat  . A fast heartbeat  . A bad rash all over your body  . Dizziness and weakness    Immunizations Administered    Name Date Dose VIS Date Route   Pfizer COVID-19 Vaccine 02/24/2019  4:59 PM 0.3 mL 12/29/2018 Intramuscular   Manufacturer: [place]   Lot: [id number]   [place]: [id number]

## 2019-02-26 ENCOUNTER — Other Ambulatory Visit: Payer: Self-pay

## 2019-02-26 ENCOUNTER — Encounter: Payer: Self-pay | Admitting: Family Medicine

## 2019-02-26 ENCOUNTER — Ambulatory Visit (INDEPENDENT_AMBULATORY_CARE_PROVIDER_SITE_OTHER): Payer: Medicare Other | Admitting: Family Medicine

## 2019-02-26 ENCOUNTER — Ambulatory Visit (INDEPENDENT_AMBULATORY_CARE_PROVIDER_SITE_OTHER): Payer: Medicare Other

## 2019-02-26 VITALS — BP 118/70 | HR 67 | Temp 97.1°F | Ht 69.0 in | Wt 214.8 lb

## 2019-02-26 DIAGNOSIS — R0989 Other specified symptoms and signs involving the circulatory and respiratory systems: Secondary | ICD-10-CM | POA: Diagnosis not present

## 2019-02-26 DIAGNOSIS — H6123 Impacted cerumen, bilateral: Secondary | ICD-10-CM

## 2019-02-26 DIAGNOSIS — R202 Paresthesia of skin: Secondary | ICD-10-CM | POA: Insufficient documentation

## 2019-02-26 DIAGNOSIS — R252 Cramp and spasm: Secondary | ICD-10-CM | POA: Insufficient documentation

## 2019-02-26 DIAGNOSIS — N401 Enlarged prostate with lower urinary tract symptoms: Secondary | ICD-10-CM

## 2019-02-26 DIAGNOSIS — Z23 Encounter for immunization: Secondary | ICD-10-CM

## 2019-02-26 DIAGNOSIS — Z8616 Personal history of COVID-19: Secondary | ICD-10-CM | POA: Insufficient documentation

## 2019-02-26 DIAGNOSIS — R3911 Hesitancy of micturition: Secondary | ICD-10-CM

## 2019-02-26 DIAGNOSIS — R972 Elevated prostate specific antigen [PSA]: Secondary | ICD-10-CM

## 2019-02-26 DIAGNOSIS — M48061 Spinal stenosis, lumbar region without neurogenic claudication: Secondary | ICD-10-CM | POA: Diagnosis not present

## 2019-02-26 DIAGNOSIS — M4807 Spinal stenosis, lumbosacral region: Secondary | ICD-10-CM | POA: Diagnosis not present

## 2019-02-26 LAB — CBC
HCT: 46 % (ref 39.0–52.0)
Hemoglobin: 15.5 g/dL (ref 13.0–17.0)
MCHC: 33.8 g/dL (ref 30.0–36.0)
MCV: 94 fl (ref 78.0–100.0)
Platelets: 185 10*3/uL (ref 150.0–400.0)
RBC: 4.9 Mil/uL (ref 4.22–5.81)
RDW: 13.2 % (ref 11.5–15.5)
WBC: 4.3 10*3/uL (ref 4.0–10.5)

## 2019-02-26 LAB — MAGNESIUM: Magnesium: 1.9 mg/dL (ref 1.5–2.5)

## 2019-02-26 LAB — VITAMIN B12: Vitamin B-12: 293 pg/mL (ref 211–911)

## 2019-02-26 LAB — PSA: PSA: 4.73 ng/mL — ABNORMAL HIGH (ref 0.10–4.00)

## 2019-02-26 LAB — TSH: TSH: 1.87 u[IU]/mL (ref 0.35–4.50)

## 2019-02-26 NOTE — Progress Notes (Addendum)
Established Patient Office Visit  Subjective:  Patient ID: Anthony Greene, male    DOB: Jun 04, 1950  Age: 69 y.o. MRN: [id number]  CC:  Chief Complaint  Patient presents with  . Annual Exam    c/o being restless at night, still having issues with hamstrings    HPI Anthony Greene presents for evaluation of numbness and tingling in his feet that is intermittent.  Worse after he walks a good distance.  Has been experiencing nighttime cramps in his posterior thigh muscles.  These wake him up at time.  Denies restless legs in the evening.   There is some back pain.  Flomax has been helping his urine flow.  Past Medical History:  Diagnosis Date  . Arthritis   . Closed fracture of transverse process of lumbar vertebra ([place]) 06/06/2017  . Hematoma of hip, right, initial encounter 06/23/2017  . Injury resulting from fall from height 06/07/2017  . Intractable low back pain 06/07/2017  . Pilonidal abscess 06/22/2017  . Pilonidal cyst 06/22/2017  . Sebaceous cyst     Past Surgical History:  Procedure Laterality Date  . CARPAL TUNNEL RELEASE Right   . COLONOSCOPY WITH PROPOFOL    . DRESSING CHANGE UNDER ANESTHESIA N/A 06/25/2017   Procedure: DRESSING CHANGE UNDER ANESTHESIA;  Surgeon: Florene Glen, MD;  Location: [facility] ORS;  Service: General;  Laterality: N/A;  . IRRIGATION AND DEBRIDEMENT BUTTOCKS Right 06/23/2017   Procedure: IRRIGATION AND DEBRIDEMENT BUTTOCKS;  Surgeon: Vickie Epley, MD;  Location: [facility] OR[facility];  Service: General;  Laterality: Right;  . JOINT REPLACEMENT     BL knee  . KNEE ARTHROSCOPY Right   . PILONIDAL CYST EXCISION N/A 01/20/2018   Procedure: EXCISION OF INCREASINGLY SYMPTOMATIC PILONIDAL CYST AND RIGHT OF UPPER BACK SEBACEOUS CYST;  Surgeon: Vickie Epley, MD;  Location: [facility] OR[facility];  Service: General;  Laterality: N/A;  . TONSILLECTOMY      Family History  Problem Relation Age of Onset  . Asthma Mother   . COPD Mother   . Stroke Father   . Cancer Brother      Social History   Socioeconomic History  . Marital status: Married    Spouse name: Not on file  . Number of children: Not on file  . Years of education: Not on file  . Highest education level: Not on file  Occupational History  . Not on file  Tobacco Use  . Smoking status: Former Smoker    Types: Cigars  . Smokeless tobacco: Never Used  Substance and Sexual Activity  . Alcohol use: Yes    Comment: 1-3 alcoholic drinks daily  . Drug use: Never  . Sexual activity: Yes  Other Topics Concern  . Not on file  Social History Narrative  . Not on file   Social Determinants of Health   Financial Resource Strain:   . Difficulty of Paying Living Expenses: Not on file  Food Insecurity:   . Worried About Charity fundraiser in the Last Year: Not on file  . Ran Out of Food in the Last Year: Not on file  Transportation Needs:   . Lack of Transportation (Medical): Not on file  . Lack of Transportation (Non-Medical): Not on file  Physical Activity:   . Days of Exercise per Week: Not on file  . Minutes of Exercise per Session: Not on file  Stress:   . Feeling of Stress : Not on file  Social Connections:   . Frequency of Communication with  Friends and Family: Not on file  . Frequency of Social Gatherings with Friends and Family: Not on file  . Attends Religious Services: Not on file  . Active Member of Clubs or Organizations: Not on file  . Attends Archivist Meetings: Not on file  . Marital Status: Not on file  Intimate Partner Violence:   . Fear of Current or Ex-Partner: Not on file  . Emotionally Abused: Not on file  . Physically Abused: Not on file  . Sexually Abused: Not on file    Outpatient Medications Prior to Visit  Medication Sig Dispense Refill  . celecoxib (CELEBREX) 200 MG capsule Take 200 mg by mouth at bedtime.     . Melatonin 3 MG CAPS Take 3 mg by mouth at bedtime.     . tamsulosin (FLOMAX) 0.4 MG CAPS capsule Take 1 capsule (0.4 mg total) by mouth  daily. 90 capsule 0   No facility-administered medications prior to visit.    Allergies  Allergen Reactions  . Dilaudid [Hydromorphone Hcl] Nausea And Vomiting  . Morphine And Related Nausea And Vomiting  . Vancomycin Other (See Comments)    redman syndrome  . Penicillins Rash    Patient has only had rash reaction to PCN in the past.  Patient and wife state he Tolerates keflex and  Ancef with no problem.  Has patient had a PCN reaction causing immediate rash, facial/tongue/throat swelling, SOB or lightheadedness with hypotension: Yes- rash only Has patient had a PCN reaction causing severe rash involving mucus membranes or skin necrosis: No Has patient had a PCN reaction that required hospitalization: No Has patient had a PCN reaction occurring within the last 10 years: No    ROS Review of Systems  Constitutional: Negative.   HENT: Negative.   Respiratory: Negative.   Cardiovascular: Negative.   Gastrointestinal: Negative.   Genitourinary: Negative for difficulty urinating, frequency and urgency.  Musculoskeletal: Positive for back pain, gait problem and myalgias.  Allergic/Immunologic: Negative for immunocompromised state.  Neurological: Positive for numbness. Negative for weakness.  Hematological: Does not bruise/bleed easily.  Psychiatric/Behavioral: Negative.    Depression screen [facility] 2/9 02/26/2019 02/26/2019 12/21/2017  Decreased Interest 0 0 0  Down, Depressed, Hopeless 0 0 0  PHQ - 2 Score 0 0 0  Altered sleeping 3 - -  Tired, decreased energy 0 - -  Change in appetite 0 - -  Feeling bad or failure about yourself  0 - -  Trouble concentrating 0 - -  Moving slowly or fidgety/restless 0 - -  Suicidal thoughts 0 - -  PHQ-9 Score 3 - -  Difficult doing work/chores Not difficult at all - -      Objective:    Physical Exam  Constitutional: He is oriented to person, place, and time. He appears well-developed and well-nourished. No distress.  HENT:  Head:  Normocephalic and atraumatic.  Right Ear: External ear normal.  Left Ear: External ear normal.  Eyes: Conjunctivae are normal. Right eye exhibits no discharge. Left eye exhibits no discharge.  Neck: No JVD present. No tracheal deviation present.  Cardiovascular: Normal rate, regular rhythm and normal heart sounds.  Pulses:      Dorsalis pedis pulses are 0 on the right side and 1+ on the left side.       Posterior tibial pulses are 0 on the right side and 0 on the left side.  Pulmonary/Chest: Effort normal and breath sounds normal. No stridor.  Musculoskeletal:     Lumbar  back: No tenderness or bony tenderness. Normal range of motion.  Lymphadenopathy:    He has no cervical adenopathy.  Neurological: He is alert and oriented to person, place, and time. He has normal strength.  Skin: Skin is dry. He is not diaphoretic.  Psychiatric: He has a normal mood and affect. His behavior is normal.    BP 118/70   Pulse 67   Temp (!) 97.1 F (36.2 C) (Tympanic)   Ht 5\' 9"  (1.753 m)   Wt 214 lb 12.8 oz (97.4 kg)   SpO2 99%   BMI 31.72 kg/m  Wt Readings from Last 3 Encounters:  02/26/19 214 lb 12.8 oz (97.4 kg)  02/02/18 212 lb (96.2 kg)  01/20/18 214 lb 8.1 oz (97.3 kg)     Health Maintenance Due  Topic Date Due  . Hepatitis C Screening  [birth year]    There are no preventive care reminders to display for this patient.  Lab Results  Component Value Date   TSH 1.87 02/26/2019   Lab Results  Component Value Date   WBC 4.3 02/26/2019   HGB 15.5 02/26/2019   HCT 46.0 02/26/2019   MCV 94.0 02/26/2019   PLT 185.0 02/26/2019   Lab Results  Component Value Date   NA 137 12/22/2017   K 4.4 12/22/2017   CO2 29 12/22/2017   GLUCOSE 93 12/22/2017   BUN 16 12/22/2017   CREATININE 0.99 12/22/2017   BILITOT 0.9 12/22/2017   ALKPHOS 47 12/22/2017   AST 16 12/22/2017   ALT 14 12/22/2017   PROT 6.8 12/22/2017   ALBUMIN 4.3 12/22/2017   CALCIUM 9.5 12/22/2017   ANIONGAP 9  06/23/2017   GFR 79.81 12/22/2017   Lab Results  Component Value Date   CHOL 183 12/22/2017   Lab Results  Component Value Date   HDL 84.00 12/22/2017   Lab Results  Component Value Date   LDLCALC 88 12/22/2017   Lab Results  Component Value Date   TRIG 52.0 12/22/2017   Lab Results  Component Value Date   CHOLHDL 2 12/22/2017   No results found for: HGBA1C    Assessment & Plan:   Problem List Items Addressed This Visit      Nervous and Auditory   Excessive cerumen in both ear canals     Genitourinary   Benign prostatic hyperplasia with urinary hesitancy   Relevant Orders   PSA (Completed)     Other   Elevated PSA   Bilateral leg cramps   Relevant Orders   Magnesium (Completed)   Paresthesias   Relevant Orders   Vitamin B12 (Completed)   CBC (Completed)   TSH (Completed)   Need for tetanus, diphtheria, and acellular pertussis (Tdap) vaccine in patient of adolescent age or older - Primary   Relevant Orders   Tdap vaccine greater than or equal to 7yo IM (Completed)   Spinal stenosis of lumbosacral region   Relevant Orders   DG Lumbar Spine Complete (Completed)   Ambulatory referral to Sports Medicine   History of COVID-19   Relevant Orders   SAR CoV2 Serology (COVID 19)AB(IGG)IA (Completed)   Diminished pulses in lower extremity   Relevant Orders   VAS [place] ABI WITH/WO TBI      No orders of the defined types were placed in this encounter.   Follow-up: Return in about 1 month (around 03/26/2019).    Libby Maw, MD

## 2019-02-26 NOTE — Patient Instructions (Signed)
Paresthesia Paresthesia is an abnormal burning or prickling sensation. It is usually felt in the hands, arms, legs, or feet. However, it may occur in any part of the body. Usually, paresthesia is not painful. It may feel like:  Tingling or numbness.  Buzzing.  Itching. Paresthesia may occur without any clear cause, or it may be caused by:  Breathing too quickly (hyperventilation).  Pressure on a nerve.  An underlying medical condition.  Side effects of a medication.  Nutritional deficiencies.  Exposure to toxic chemicals. Most people experience temporary (transient) paresthesia at some time in their lives. For some people, it may be long-lasting (chronic) because of an underlying medical condition. If you have paresthesia that lasts a long time, you may need to be evaluated by your health care provider. Follow these instructions at home: Alcohol use   Do not drink alcohol if: ? Your health care provider tells you not to drink. ? You are pregnant, may be pregnant, or are planning to become pregnant.  If you drink alcohol: ? Limit how much you use to:  0-1 drink a day for women.  0-2 drinks a day for men. ? Be aware of how much alcohol is in your drink. In the [place] one drink equals one 12 oz bottle of beer (355 mL), one 5 oz glass of wine (148 mL), or one 1 oz glass of hard liquor (44 mL). Nutrition   Eat a healthy diet. This includes: ? Eating foods that are high in fiber, such as fresh fruits and vegetables, whole grains, and beans. ? Limiting foods that are high in fat and processed sugars, such as fried or sweet foods. General instructions  Take over-the-counter and prescription medicines only as told by your health care provider.  Do not use any products that contain nicotine or tobacco, such as cigarettes and e-cigarettes. These can keep blood from reaching damaged nerves. If you need help quitting, ask your health care provider.  If you have diabetes, work  closely with your health care provider to keep your blood sugar under control.  If you have numbness in your feet: ? Check every day for signs of injury or infection. Watch for redness, warmth, and swelling. ? Wear padded socks and comfortable shoes. These help protect your feet.  Keep all follow-up visits as told by your health care provider. This is important. Contact a health care provider if you:  Have paresthesia that gets worse or does not go away.  Have a burning or prickling feeling that gets worse when you walk.  Have pain, cramps, or dizziness.  Develop a rash. Get help right away if you:  Feel weak.  Have trouble walking or moving.  Have problems with speech, understanding, or vision.  Feel confused.  Cannot control your bladder or bowel movements.  Have numbness after an injury.  Develop new weakness in an arm or leg.  Faint. Summary  Paresthesia is an abnormal burning or prickling sensation that is usually felt in the hands, arms, legs, or feet. It may also occur in other parts of the body.  Paresthesia may occur without any clear cause, or it may be caused by breathing too quickly (hyperventilation), pressure on a nerve, an underlying medical condition, side effects of a medication, nutritional deficiencies, or exposure to toxic chemicals.  If you have paresthesia that lasts a long time, you may need to be evaluated by your health care provider. This information is not intended to replace advice given to you by   your health care provider. Make sure you discuss any questions you have with your health care provider. Document Revised: 01/30/2018 Document Reviewed: 01/13/2017 Elsevier Patient Education  2020 Elsevier Inc.   Peripheral Neuropathy Peripheral neuropathy is a type of nerve damage. It affects nerves that carry signals between the spinal cord and the arms, legs, and the rest of the body (peripheral nerves). It does not affect nerves in the spinal  cord or brain. In peripheral neuropathy, one nerve or a group of nerves may be damaged. Peripheral neuropathy is a broad category that includes many specific nerve disorders, like diabetic neuropathy, hereditary neuropathy, and carpal tunnel syndrome. What are the causes? This condition may be caused by:  Diabetes. This is the most common cause of peripheral neuropathy.  Nerve injury.  Pressure or stress on a nerve that lasts a long time.  Lack (deficiency) of B vitamins. This can result from alcoholism, poor diet, or a restricted diet.  Infections.  Autoimmune diseases, such as rheumatoid arthritis and systemic lupus erythematosus.  Nerve diseases that are passed from parent to child (inherited).  Some medicines, such as cancer medicines (chemotherapy).  Poisonous (toxic) substances, such as lead and mercury.  Too little blood flowing to the legs.  Kidney disease.  Thyroid disease. In some cases, the cause of this condition is not known. What are the signs or symptoms? Symptoms of this condition depend on which of your nerves is damaged. Common symptoms include:  Loss of feeling (numbness) in the feet, hands, or both.  Tingling in the feet, hands, or both.  Burning pain.  Very sensitive skin.  Weakness.  Not being able to move a part of the body (paralysis).  Muscle twitching.  Clumsiness or poor coordination.  Loss of balance.  Not being able to control your bladder.  Feeling dizzy.  Sexual problems. How is this diagnosed? Diagnosing and finding the cause of peripheral neuropathy can be difficult. Your health care provider will take your medical history and do a physical exam. A neurological exam will also be done. This involves checking things that are affected by your brain, spinal cord, and nerves (nervous system). For example, your health care provider will check your reflexes, how you move, and what you can feel. You may have other tests, such  as:  Blood tests.  Electromyogram (EMG) and nerve conduction tests. These tests check nerve function and how well the nerves are controlling the muscles.  Imaging tests, such as CT scans or MRI to rule out other causes of your symptoms.  Removing a small piece of nerve to be examined in a lab (nerve biopsy). This is rare.  Removing and examining a small amount of the fluid that surrounds the brain and spinal cord (lumbar puncture). This is rare. How is this treated? Treatment for this condition may involve:  Treating the underlying cause of the neuropathy, such as diabetes, kidney disease, or vitamin deficiencies.  Stopping medicines that can cause neuropathy, such as chemotherapy.  Medicine to relieve pain. Medicines may include: ? Prescription or over-the-counter pain medicine. ? Antiseizure medicine. ? Antidepressants. ? Pain-relieving patches that are applied to painful areas of skin.  Surgery to relieve pressure on a nerve or to destroy a nerve that is causing pain.  Physical therapy to help improve movement and balance.  Devices to help you move around (assistive devices). Follow these instructions at home: Medicines  Take over-the-counter and prescription medicines only as told by your health care provider. Do not take any   other medicines without first asking your health care provider.  Do not drive or use heavy machinery while taking prescription pain medicine. Lifestyle   Do not use any products that contain nicotine or tobacco, such as cigarettes and e-cigarettes. Smoking keeps blood from reaching damaged nerves. If you need help quitting, ask your health care provider.  Avoid or limit alcohol. Too much alcohol can cause a vitamin B deficiency, and vitamin B is needed for healthy nerves.  Eat a healthy diet. This includes: ? Eating foods that are high in fiber, such as fresh fruits and vegetables, whole grains, and beans. ? Limiting foods that are high in fat and  processed sugars, such as fried or sweet foods. General instructions   If you have diabetes, work closely with your health care provider to keep your blood sugar under control.  If you have numbness in your feet: ? Check every day for signs of injury or infection. Watch for redness, warmth, and swelling. ? Wear padded socks and comfortable shoes. These help protect your feet.  Develop a good support system. Living with peripheral neuropathy can be stressful. Consider talking with a mental health specialist or joining a support group.  Use assistive devices and attend physical therapy as told by your health care provider. This may include using a walker or a cane.  Keep all follow-up visits as told by your health care provider. This is important. Contact a health care provider if:  You have new signs or symptoms of peripheral neuropathy.  You are struggling emotionally from dealing with peripheral neuropathy.  Your pain is not well-controlled. Get help right away if:  You have an injury or infection that is not healing normally.  You develop new weakness in an arm or leg.  You fall frequently. Summary  Peripheral neuropathy is when the nerves in the arms, or legs are damaged, resulting in numbness, weakness, or pain.  There are many causes of peripheral neuropathy, including diabetes, pinched nerves, vitamin deficiencies, autoimmune disease, and hereditary conditions.  Diagnosing and finding the cause of peripheral neuropathy can be difficult. Your health care provider will take your medical history, do a physical exam, and do tests, including blood tests and nerve function tests.  Treatment involves treating the underlying cause of the neuropathy and taking medicines to help control pain. Physical therapy and assistive devices may also help. This information is not intended to replace advice given to you by your health care provider. Make sure you discuss any questions you have  with your health care provider. Document Revised: 12/17/2016 Document Reviewed: 03/15/2016 Elsevier Patient Education  2020 Elsevier Inc.   

## 2019-02-27 LAB — SAR COV2 SEROLOGY (COVID19)AB(IGG),IA: SARS CoV2 AB IGG: NEGATIVE

## 2019-02-28 ENCOUNTER — Ambulatory Visit: Payer: Medicare Other

## 2019-03-05 ENCOUNTER — Encounter: Payer: Self-pay | Admitting: Family Medicine

## 2019-03-05 ENCOUNTER — Ambulatory Visit (INDEPENDENT_AMBULATORY_CARE_PROVIDER_SITE_OTHER): Payer: Medicare Other | Admitting: Family Medicine

## 2019-03-05 ENCOUNTER — Other Ambulatory Visit: Payer: Self-pay

## 2019-03-05 DIAGNOSIS — R202 Paresthesia of skin: Secondary | ICD-10-CM

## 2019-03-05 NOTE — Progress Notes (Signed)
Anthony Greene - 69 y.o. male MRN [id number]  Date of birth: 03/24/1950  SUBJECTIVE:  Including CC & ROS.  Chief Complaint  Patient presents with  . Foot Pain    bilateral foot numbness    Anthony Greene is a 69 y.o. male that is presenting with paresthesias of his feet.  Has been occurring for 2 to 3 months.  It is in a sock-like distribution of each foot.  It is occurs with standing or walking.  It relieves once he sits down.  Denies any pain.  Has a history of total knee arthroplasty bilaterally.  This was done about 4 years ago.  Denies any history of diabetes or thyroid problem.  No new or different medications.  He does have a history of psoriatic arthritis and receives an infusion for this.   Review of Systems See HPI   HISTORY: Past Medical, Surgical, Social, and Family History Reviewed & Updated per EMR.   Pertinent Historical Findings include:  Past Medical History:  Diagnosis Date  . Arthritis   . Closed fracture of transverse process of lumbar vertebra ([place]) 06/06/2017  . Hematoma of hip, right, initial encounter 06/23/2017  . Injury resulting from fall from height 06/07/2017  . Intractable low back pain 06/07/2017  . Pilonidal abscess 06/22/2017  . Pilonidal cyst 06/22/2017  . Sebaceous cyst     Past Surgical History:  Procedure Laterality Date  . CARPAL TUNNEL RELEASE Right   . COLONOSCOPY WITH PROPOFOL    . DRESSING CHANGE UNDER ANESTHESIA N/A 06/25/2017   Procedure: DRESSING CHANGE UNDER ANESTHESIA;  Surgeon: Florene Glen, MD;  Location: [facility] ORS;  Service: General;  Laterality: N/A;  . IRRIGATION AND DEBRIDEMENT BUTTOCKS Right 06/23/2017   Procedure: IRRIGATION AND DEBRIDEMENT BUTTOCKS;  Surgeon: Vickie Epley, MD;  Location: [facility] ORS;  Service: General;  Laterality: Right;  . JOINT REPLACEMENT     BL knee  . KNEE ARTHROSCOPY Right   . PILONIDAL CYST EXCISION N/A 01/20/2018   Procedure: EXCISION OF INCREASINGLY SYMPTOMATIC PILONIDAL CYST AND RIGHT OF UPPER BACK  SEBACEOUS CYST;  Surgeon: Vickie Epley, MD;  Location: [facility] ORS;  Service: General;  Laterality: N/A;  . TONSILLECTOMY      Family History  Problem Relation Age of Onset  . Asthma Mother   . COPD Mother   . Stroke Father   . Cancer Brother     Social History   Socioeconomic History  . Marital status: Married    Spouse name: Not on file  . Number of children: Not on file  . Years of education: Not on file  . Highest education level: Not on file  Occupational History  . Not on file  Tobacco Use  . Smoking status: Former Smoker    Types: Cigars  . Smokeless tobacco: Never Used  Substance and Sexual Activity  . Alcohol use: Yes    Comment: 1-3 alcoholic drinks daily  . Drug use: Never  . Sexual activity: Yes  Other Topics Concern  . Not on file  Social History Narrative  . Not on file   Social Determinants of Health   Financial Resource Strain:   . Difficulty of Paying Living Expenses: Not on file  Food Insecurity:   . Worried About Charity fundraiser in the Last Year: Not on file  . Ran Out of Food in the Last Year: Not on file  Transportation Needs:   . Lack of Transportation (Medical): Not on file  . Lack of  Transportation (Non-Medical): Not on file  Physical Activity:   . Days of Exercise per Week: Not on file  . Minutes of Exercise per Session: Not on file  Stress:   . Feeling of Stress : Not on file  Social Connections:   . Frequency of Communication with Friends and Family: Not on file  . Frequency of Social Gatherings with Friends and Family: Not on file  . Attends Religious Services: Not on file  . Active Member of Clubs or Organizations: Not on file  . Attends Archivist Meetings: Not on file  . Marital Status: Not on file  Intimate Partner Violence:   . Fear of Current or Ex-Partner: Not on file  . Emotionally Abused: Not on file  . Physically Abused: Not on file  . Sexually Abused: Not on file     PHYSICAL EXAM:  VS: BP 137/80    Pulse 73   Ht 5\' 9"  (1.753 m)   Wt 210 lb (95.3 kg)   BMI 31.01 kg/m  Physical Exam Gen: NAD, alert, cooperative with exam, well-appearing MSK:  Right and left foot:  No swelling or ecchymosis. No color change. Well-maintained longitudinal arch. Sensation to point intact in bilateral great toes. No abnormal callus formation. Normal range of motion. Normal strength resistance. Neurovascularly intact     ASSESSMENT & PLAN:   Paresthesia of both feet Only occurring while he is standing or ambulating.  Possible for vascular component.  Could be more of a central nerve related issue as opposed to referral.  Does have a history of psoriatic arthritis and receiving infusion. -He has ABI scheduled. -If nonconclusive will consider EMG. -Counseled on semirigid insole. -Counseled supportive care.

## 2019-03-05 NOTE — Patient Instructions (Signed)
Nice to meet you Please try a semi rigid insole.  Please let me know about the Ankle brachial index results.   Please send me a message in MyChart with any questions or updates.  Please see me back in 4 weeks.   --Dr. Raeford Razor

## 2019-03-05 NOTE — Assessment & Plan Note (Signed)
Only occurring while he is standing or ambulating.  Possible for vascular component.  Could be more of a central nerve related issue as opposed to referral.  Does have a history of psoriatic arthritis and receiving infusion. -He has ABI scheduled. -If nonconclusive will consider EMG. -Counseled on semirigid insole. -Counseled supportive care.

## 2019-03-13 ENCOUNTER — Encounter (HOSPITAL_BASED_OUTPATIENT_CLINIC_OR_DEPARTMENT_OTHER): Payer: Medicare Other

## 2019-03-19 ENCOUNTER — Ambulatory Visit (HOSPITAL_BASED_OUTPATIENT_CLINIC_OR_DEPARTMENT_OTHER): Payer: Medicare Other

## 2019-03-21 ENCOUNTER — Ambulatory Visit: Payer: Medicare Other | Attending: Internal Medicine

## 2019-03-21 DIAGNOSIS — Z23 Encounter for immunization: Secondary | ICD-10-CM

## 2019-03-21 NOTE — Progress Notes (Signed)
   Covid-19 Vaccination Clinic  Name:  Anthony Greene    MRN: [id number] DOB: 11/14/1950  03/21/2019  [name] was observed post Covid-19 immunization for 15 minutes without incident. He was provided with Vaccine Information Sheet and instruction to access the V-Safe system.   [name] was instructed to call 911 with any severe reactions post vaccine: Marland Kitchen Difficulty breathing  . Swelling of face and throat  . A fast heartbeat  . A bad rash all over body  . Dizziness and weakness   Immunizations Administered    Name Date Dose VIS Date Route   Pfizer COVID-19 Vaccine 03/21/2019  1:23 PM 0.3 mL 12/29/2018 Intramuscular   Manufacturer: [place]   Lot: [id number]   [place]: [id number]

## 2019-03-22 ENCOUNTER — Other Ambulatory Visit: Payer: Self-pay

## 2019-03-22 ENCOUNTER — Ambulatory Visit (HOSPITAL_BASED_OUTPATIENT_CLINIC_OR_DEPARTMENT_OTHER)
Admission: RE | Admit: 2019-03-22 | Discharge: 2019-03-22 | Disposition: A | Discharge status: Home / Self Care | Payer: Medicare Other | Source: Ambulatory Visit | Attending: Family Medicine | Admitting: Family Medicine

## 2019-03-22 DIAGNOSIS — R0989 Other specified symptoms and signs involving the circulatory and respiratory systems: Secondary | ICD-10-CM | POA: Diagnosis not present

## 2019-03-22 NOTE — Progress Notes (Signed)
ABI is complete, results are located under CV Procedure.  Darlina Sicilian

## 2019-04-09 ENCOUNTER — Ambulatory Visit: Payer: Medicare Other | Admitting: Family Medicine

## 2019-04-13 DIAGNOSIS — L814 Other melanin hyperpigmentation: Secondary | ICD-10-CM | POA: Diagnosis not present

## 2019-04-13 DIAGNOSIS — C44519 Basal cell carcinoma of skin of other part of trunk: Secondary | ICD-10-CM | POA: Diagnosis not present

## 2019-04-13 DIAGNOSIS — Z85828 Personal history of other malignant neoplasm of skin: Secondary | ICD-10-CM | POA: Diagnosis not present

## 2019-04-13 DIAGNOSIS — L821 Other seborrheic keratosis: Secondary | ICD-10-CM | POA: Diagnosis not present

## 2019-04-13 DIAGNOSIS — L570 Actinic keratosis: Secondary | ICD-10-CM | POA: Diagnosis not present

## 2019-04-16 ENCOUNTER — Other Ambulatory Visit: Payer: Self-pay

## 2019-04-16 ENCOUNTER — Encounter: Payer: Self-pay | Admitting: Family Medicine

## 2019-04-16 ENCOUNTER — Ambulatory Visit (INDEPENDENT_AMBULATORY_CARE_PROVIDER_SITE_OTHER): Payer: Medicare Other | Admitting: Family Medicine

## 2019-04-16 VITALS — BP 122/73 | HR 63 | Ht 69.0 in | Wt 210.0 lb

## 2019-04-16 DIAGNOSIS — R202 Paresthesia of skin: Secondary | ICD-10-CM | POA: Diagnosis not present

## 2019-04-16 MED ORDER — GABAPENTIN 300 MG PO CAPS: 300.0000 mg | ORAL_CAPSULE | Freq: Two times a day (BID) | ORAL | 1 refills | Status: DC

## 2019-04-16 NOTE — Patient Instructions (Signed)
Good to see you Please try the gabapentin. You can start with one pill daily and increase to 2 or 3 times daily as you tolerate  Neurology will give you a call to set up the nerve study.   Please send me a message in MyChart with any questions or updates.  We will set up a virtual visit once the nerve study is resulted.   --Dr. Raeford Razor

## 2019-04-16 NOTE — Assessment & Plan Note (Signed)
Altered sensation is still ongoing. Starting to creep up above the ankles. 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 Possible to psoriatic arthritis infusion medication.  - EMG  - Gabapentin  - counseled on supportive care - could consider lab testing or MRi lumbar spine

## 2019-04-16 NOTE — Progress Notes (Signed)
Anthony Greene - 69 y.o. male MRN [id number]  Date of birth: 17-Jul-1950  SUBJECTIVE:  Including CC & ROS.  Chief Complaint  Patient presents with  . Follow-up    follow up for bilateral foot    Anthony Greene is a 69 y.o. male that is following up for his paresthesias in his left and right foot.  His test at this point has been normal.  The symptoms seem to be progressing further up his ankle.  He only notices it with being active and on his feet.  He has no problems with sitting or while he is sleeping.  Review of ankle-brachial indices from 3/4 showed normal exam.   Review of Systems See HPI   HISTORY: Past Medical, Surgical, Social, and Family History Reviewed & Updated per EMR.   Pertinent Historical Findings include:  Past Medical History:  Diagnosis Date  . Arthritis   . Closed fracture of transverse process of lumbar vertebra ([place]) 06/06/2017  . Hematoma of hip, right, initial encounter 06/23/2017  . Injury resulting from fall from height 06/07/2017  . Intractable low back pain 06/07/2017  . Pilonidal abscess 06/22/2017  . Pilonidal cyst 06/22/2017  . Sebaceous cyst     Past Surgical History:  Procedure Laterality Date  . CARPAL TUNNEL RELEASE Right   . COLONOSCOPY WITH PROPOFOL    . DRESSING CHANGE UNDER ANESTHESIA N/A 06/25/2017   Procedure: DRESSING CHANGE UNDER ANESTHESIA;  Surgeon: Florene Glen, MD;  Location: [facility] ORS;  Service: General;  Laterality: N/A;  . IRRIGATION AND DEBRIDEMENT BUTTOCKS Right 06/23/2017   Procedure: IRRIGATION AND DEBRIDEMENT BUTTOCKS;  Surgeon: Vickie Epley, MD;  Location: [facility] OR[facility];  Service: General;  Laterality: Right;  . JOINT REPLACEMENT     BL knee  . KNEE ARTHROSCOPY Right   . PILONIDAL CYST EXCISION N/A 01/20/2018   Procedure: EXCISION OF INCREASINGLY SYMPTOMATIC PILONIDAL CYST AND RIGHT OF UPPER BACK SEBACEOUS CYST;  Surgeon: Vickie Epley, MD;  Location: [facility] OR[facility];  Service: General;  Laterality: N/A;  . TONSILLECTOMY       Family History  Problem Relation Age of Onset  . Asthma Mother   . COPD Mother   . Stroke Father   . Cancer Brother     Social History   Socioeconomic History  . Marital status: Married    Spouse name: Not on file  . Number of children: Not on file  . Years of education: Not on file  . Highest education level: Not on file  Occupational History  . Not on file  Tobacco Use  . Smoking status: Former Smoker    Types: Cigars  . Smokeless tobacco: Never Used  Substance and Sexual Activity  . Alcohol use: Yes    Comment: 1-3 alcoholic drinks daily  . Drug use: Never  . Sexual activity: Yes  Other Topics Concern  . Not on file  Social History Narrative  . Not on file   Social Determinants of Health   Financial Resource Strain:   . Difficulty of Paying Living Expenses:   Food Insecurity:   . Worried About Charity fundraiser in the Last Year:   . Arboriculturist in the Last Year:   Transportation Needs:   . Film/video editor (Medical):   Marland Kitchen Lack of Transportation (Non-Medical):   Physical Activity:   . Days of Exercise per Week:   . Minutes of Exercise per Session:   Stress:   . Feeling of Stress :  Social Connections:   . Frequency of Communication with Friends and Family:   . Frequency of Social Gatherings with Friends and Family:   . Attends Religious Services:   . Active Member of Clubs or Organizations:   . Attends Archivist Meetings:   Marland Kitchen Marital Status:   Intimate Partner Violence:   . Fear of Current or Ex-Partner:   . Emotionally Abused:   Marland Kitchen Physically Abused:   . Sexually Abused:      PHYSICAL EXAM:  VS: BP 122/73   Pulse 63   Ht 5\' 9"  (1.753 m)   Wt 210 lb (95.3 kg)   BMI 31.01 kg/m  Physical Exam Gen: NAD, alert, cooperative with exam, well-appearing MSK:  Right and left foot: Normal range of motion. Normal strength resistance. No signs of atrophy. Normal gait. Neurovascularly intact    ASSESSMENT & PLAN:    Paresthesia of both feet Altered sensation is still ongoing. Starting to creep up above the ankles. 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 Possible to psoriatic arthritis infusion medication.  - EMG  - Gabapentin  - counseled on supportive care - could consider lab testing or MRi lumbar spine

## 2019-04-17 DIAGNOSIS — L4050 Arthropathic psoriasis, unspecified: Secondary | ICD-10-CM | POA: Diagnosis not present

## 2019-04-17 NOTE — Progress Notes (Signed)
Nurse connected with patient 04/18/19 at  8:45 AM EDT by a telephone enabled telemedicine application and verified that I am speaking with the correct person using two identifiers. Patient stated full name and DOB. Patient gave permission to continue with virtual visit. Patient's location was at home and Nurse's location was at [place] office.   Subjective:   Anthony Greene is a 69 y.o. male who presents for Medicare Annual/Subsequent preventive examination.  Review of Systems:  Home Safety/Smoke Alarms: Feels safe in home. Smoke alarms in place.  Lives w/ wife. 2 story home. Does well w/ stairs.   Male:   CCS- 04/16/16.     PSA-  Lab Results  Component Value Date   PSA 4.73 (H) 02/26/2019   PSA 3.52 01/27/2018   PSA 5.83 (H) 12/22/2017       Objective:    Vitals: Unable to assess. This visit is enabled though telemedicine due to Covid 19.   Advanced Directives 04/18/2019 01/09/2018 12/21/2017 06/23/2017 06/23/2017 06/19/2017  Does Patient Have a Medical Advance Directive? Yes Yes Yes Yes No No  Type of Paramedic of [place];Living will [place];Living will [place];Living will [place];Living will - -  Does patient want to make changes to medical advance directive? No - Patient declined - - No - Patient declined - -  Copy of [place] in Chart? No - copy requested No - copy requested No - copy requested No - copy requested - -  Would patient like information on creating a medical advance directive? - - - No - Patient declined No - Patient declined No - Patient declined    Tobacco Social History   Tobacco Use  Smoking Status Former Smoker  . Types: Cigars  Smokeless Tobacco Never Used     Counseling given: Not Answered   Clinical Intake: Pain : No/denies pain     Past Medical History:  Diagnosis Date  . Arthritis   . Closed fracture of transverse process of lumbar  vertebra ([place]) 06/06/2017  . Hematoma of hip, right, initial encounter 06/23/2017  . Injury resulting from fall from height 06/07/2017  . Intractable low back pain 06/07/2017  . Pilonidal abscess 06/22/2017  . Pilonidal cyst 06/22/2017  . Sebaceous cyst    Past Surgical History:  Procedure Laterality Date  . CARPAL TUNNEL RELEASE Right   . COLONOSCOPY WITH PROPOFOL    . DRESSING CHANGE UNDER ANESTHESIA N/A 06/25/2017   Procedure: DRESSING CHANGE UNDER ANESTHESIA;  Surgeon: Florene Glen, MD;  Location: [facility];  Service: General;  Laterality: N/A;  . IRRIGATION AND DEBRIDEMENT BUTTOCKS Right 06/23/2017   Procedure: IRRIGATION AND DEBRIDEMENT BUTTOCKS;  Surgeon: Vickie Epley, MD;  Location: [facility];  Service: General;  Laterality: Right;  . JOINT REPLACEMENT     BL knee  . KNEE ARTHROSCOPY Right   . PILONIDAL CYST EXCISION N/A 01/20/2018   Procedure: EXCISION OF INCREASINGLY SYMPTOMATIC PILONIDAL CYST AND RIGHT OF UPPER BACK SEBACEOUS CYST;  Surgeon: Vickie Epley, MD;  Location: [facility];  Service: General;  Laterality: N/A;  . TONSILLECTOMY     Family History  Problem Relation Age of Onset  . Asthma Mother   . COPD Mother   . Stroke Father   . Cancer Brother    Social History   Socioeconomic History  . Marital status: Married    Spouse name: Not on file  . Number of children: Not on file  .  Years of education: Not on file  . Highest education level: Not on file  Occupational History  . Not on file  Tobacco Use  . Smoking status: Former Smoker    Types: Cigars  . Smokeless tobacco: Never Used  Substance and Sexual Activity  . Alcohol use: Yes    Comment: 1-3 alcoholic drinks daily  . Drug use: Never  . Sexual activity: Yes  Other Topics Concern  . Not on file  Social History Narrative  . Not on file   Social Determinants of Health   Financial Resource Strain:   . Difficulty of Paying Living Expenses:   Food Insecurity:   . Worried About Charity fundraiser  in the Last Year:   . Arboriculturist in the Last Year:   Transportation Needs:   . Film/video editor (Medical):   Marland Kitchen Lack of Transportation (Non-Medical):   Physical Activity:   . Days of Exercise per Week:   . Minutes of Exercise per Session:   Stress:   . Feeling of Stress :   Social Connections:   . Frequency of Communication with Friends and Family:   . Frequency of Social Gatherings with Friends and Family:   . Attends Religious Services:   . Active Member of Clubs or Organizations:   . Attends Archivist Meetings:   Marland Kitchen Marital Status:     Outpatient Encounter Medications as of 04/18/2019  Medication Sig  . celecoxib (CELEBREX) 200 MG capsule Take 200 mg by mouth at bedtime.   . gabapentin (NEURONTIN) 300 MG capsule Take 1 capsule (300 mg total) by mouth 2 (two) times daily.  . Melatonin 3 MG CAPS Take 3 mg by mouth at bedtime.   . tamsulosin (FLOMAX) 0.4 MG CAPS capsule TAKE 1 CAPSULE BY MOUTH DAILY.  . [DISCONTINUED] tamsulosin (FLOMAX) 0.4 MG CAPS capsule Take 1 capsule (0.4 mg total) by mouth daily.   No facility-administered encounter medications on file as of 04/18/2019.    Activities of Daily Living In your present state of health, do you have any difficulty performing the following activities: 04/18/2019  Hearing? N  Vision? N  Difficulty concentrating or making decisions? N  Walking or climbing stairs? N  Dressing or bathing? N  Doing errands, shopping? N  Preparing Food and eating ? N  Using the Toilet? N  In the past six months, have you accidently leaked urine? N  Do you have problems with loss of bowel control? N  Managing your Medications? N  Managing your Finances? N  Housekeeping or managing your Housekeeping? N  Some recent data might be hidden    Patient Care Team: Libby Maw, MD as PCP - General (Family Medicine)   Assessment:   This is a routine wellness examination for Anthony Greene. Physical assessment deferred to  PCP.  Exercise Activities and Dietary recommendations Current Exercise Habits: Home exercise routine, Type of exercise: walking, Time (Minutes): 30, Frequency (Times/Week): 2, Weekly Exercise (Minutes/Week): 60, Exercise limited by: None identified Diet (meal preparation, eat out, water intake, caffeinated beverages, dairy products, fruits and vegetables): in general, a "healthy" diet  , well balanced   Goals    . Maintain current health     Continue to exercise and eat healthy       Fall Risk Fall Risk  04/18/2019 02/26/2019 02/02/2018 12/21/2017 12/20/2017  Falls in the past year? 0 0 0 1 0  Number falls in past yr: 0 - - 0 -  Injury with Fall? 0 - - - -  Follow up Education provided;Falls prevention discussed - Falls evaluation completed - -     Depression Screen PHQ 2/9 Scores 02/26/2019 02/26/2019 12/21/2017  PHQ - 2 Score 0 0 0  PHQ- 9 Score 3 - -    Cognitive Function Ad8 score reviewed for issues:  Issues making decisions:no  Less interest in hobbies / activities:no  Repeats questions, stories (family complaining):no  Trouble using ordinary gadgets (microwave, computer, phone):no  Forgets the month or year: no  Mismanaging finances: no  Remembering appts:no  Daily problems with thinking and/or memory:no Ad8 score is=0            Immunization History  Administered Date(s) Administered  . Fluad Quad(high Dose 65+) 09/12/2018  . Influenza Inj Mdck Quad Pf 11/01/2017  . Influenza Split 11/05/2014  . Influenza,inj,quad, With Preservative 09/09/2015  . Influenza-Unspecified 09/18/2016  . PFIZER SARS-COV-2 Vaccination 02/24/2019, 03/21/2019  . Pneumococcal Polysaccharide-23 11/05/2014  . Tdap 02/26/2019      Screening Tests Health Maintenance  Topic Date Due  . Hepatitis C Screening  Never done  . COLONOSCOPY  04/17/2026  . TETANUS/TDAP  02/25/2029  . INFLUENZA VACCINE  Completed  . PNA vac Low Risk Adult  Completed       Plan:    Please schedule  your next medicare wellness visit with me in 1 yr.  Continue to eat heart healthy diet (full of fruits, vegetables, whole grains, lean protein, water--limit salt, fat, and sugar intake) and increase physical activity as tolerated.  Continue doing brain stimulating activities (puzzles, reading, adult coloring books, staying active) to keep memory sharp.   Bring a copy of your living will and/or healthcare power of attorney to your next office visit.    I have personally reviewed and noted the following in the patient's chart:   . Medical and social history . Use of alcohol, tobacco or illicit drugs  . Current medications and supplements . Functional ability and status . Nutritional status . Physical activity . Advanced directives . List of other physicians . Hospitalizations, surgeries, and ER visits in previous 12 months . Vitals . Screenings to include cognitive, depression, and falls . Referrals and appointments  In addition, I have reviewed and discussed with patient certain preventive protocols, quality metrics, and best practice recommendations. A written personalized care plan for preventive services as well as general preventive health recommendations were provided to patient.     Shela Nevin, [place]  04/18/2019

## 2019-04-18 ENCOUNTER — Ambulatory Visit (INDEPENDENT_AMBULATORY_CARE_PROVIDER_SITE_OTHER): Payer: Medicare Other | Admitting: *Deleted

## 2019-04-18 ENCOUNTER — Other Ambulatory Visit: Payer: Self-pay | Admitting: Family Medicine

## 2019-04-18 ENCOUNTER — Encounter: Payer: Self-pay | Admitting: *Deleted

## 2019-04-18 DIAGNOSIS — Z0000 Encounter for general adult medical examination without abnormal findings: Secondary | ICD-10-CM | POA: Diagnosis not present

## 2019-04-18 DIAGNOSIS — N401 Enlarged prostate with lower urinary tract symptoms: Secondary | ICD-10-CM

## 2019-04-18 NOTE — Patient Instructions (Signed)
Please schedule your next medicare wellness visit with me in 1 yr.  Continue to eat heart healthy diet (full of fruits, vegetables, whole grains, lean protein, water--limit salt, fat, and sugar intake) and increase physical activity as tolerated.  Continue doing brain stimulating activities (puzzles, reading, adult coloring books, staying active) to keep memory sharp.   Bring a copy of your living will and/or healthcare power of attorney to your next office visit.   Anthony Greene , Thank you for taking time to come for your Medicare Wellness Visit. I appreciate your ongoing commitment to your health goals. Please review the following plan we discussed and let me know if I can assist you in the future.   These are the goals we discussed: Goals    . Maintain current health     Continue to exercise and eat healthy       This is a list of the screening recommended for you and due dates:  Health Maintenance  Topic Date Due  .  Hepatitis C: One time screening is recommended by Center for Disease Control  (CDC) for  adults born from [birth year] through 1966.   Never done  . Colon Cancer Screening  04/17/2026  . Tetanus Vaccine  02/25/2029  . Flu Shot  Completed  . Pneumonia vaccines  Completed    Preventive Care 35 Years and Older, Male Preventive care refers to lifestyle choices and visits with your health care provider that can promote health and wellness. This includes:  A yearly physical exam. This is also called an annual well check.  Regular dental and eye exams.  Immunizations.  Screening for certain conditions.  Healthy lifestyle choices, such as diet and exercise. What can I expect for my preventive care visit? Physical exam Your health care provider will check:  Height and weight. These may be used to calculate body mass index (BMI), which is a measurement that tells if you are at a healthy weight.  Heart rate and blood pressure.  Your skin for abnormal  spots. Counseling Your health care provider may ask you questions about:  Alcohol, tobacco, and drug use.  Emotional well-being.  Home and relationship well-being.  Sexual activity.  Eating habits.  History of falls.  Memory and ability to understand (cognition).  Work and work Statistician. What immunizations do I need?  Influenza (flu) vaccine  This is recommended every year. Tetanus, diphtheria, and pertussis (Tdap) vaccine  You may need a Td booster every 10 years. Varicella (chickenpox) vaccine  You may need this vaccine if you have not already been vaccinated. Zoster (shingles) vaccine  You may need this after age 48. Pneumococcal conjugate (PCV13) vaccine  One dose is recommended after age 55. Pneumococcal polysaccharide (PPSV23) vaccine  One dose is recommended after age 59. Measles, mumps, and rubella (MMR) vaccine  You may need at least one dose of MMR if you were born in 1957 or later. You may also need a second dose. Meningococcal conjugate (MenACWY) vaccine  You may need this if you have certain conditions. Hepatitis A vaccine  You may need this if you have certain conditions or if you travel or work in places where you may be exposed to hepatitis A. Hepatitis B vaccine  You may need this if you have certain conditions or if you travel or work in places where you may be exposed to hepatitis B. Haemophilus influenzae type b (Hib) vaccine  You may need this if you have certain conditions. You may receive vaccines  as individual doses or as more than one vaccine together in one shot (combination vaccines). Talk with your health care provider about the risks and benefits of combination vaccines. What tests do I need? Blood tests  Lipid and cholesterol levels. These may be checked every 5 years, or more frequently depending on your overall health.  Hepatitis C test.  Hepatitis B test. Screening  Lung cancer screening. You may have this screening  every year starting at age 72 if you have a 30-pack-year history of smoking and currently smoke or have quit within the past 15 years.  Colorectal cancer screening. All adults should have this screening starting at age 26 and continuing until age 74. Your health care provider may recommend screening at age 66 if you are at increased risk. You will have tests every 1-10 years, depending on your results and the type of screening test.  Prostate cancer screening. Recommendations will vary depending on your family history and other risks.  Diabetes screening. This is done by checking your blood sugar (glucose) after you have not eaten for a while (fasting). You may have this done every 1-3 years.  Abdominal aortic aneurysm (AAA) screening. You may need this if you are a current or former smoker.  Sexually transmitted disease (STD) testing. Follow these instructions at home: Eating and drinking  Eat a diet that includes fresh fruits and vegetables, whole grains, lean protein, and low-fat dairy products. Limit your intake of foods with high amounts of sugar, saturated fats, and salt.  Take vitamin and mineral supplements as recommended by your health care provider.  Do not drink alcohol if your health care provider tells you not to drink.  If you drink alcohol: ? Limit how much you have to 0-2 drinks a day. ? Be aware of how much alcohol is in your drink. In the [place] one drink equals one 12 oz bottle of beer (355 mL), one 5 oz glass of wine (148 mL), or one 1 oz glass of hard liquor (44 mL). Lifestyle  Take daily care of your teeth and gums.  Stay active. Exercise for at least 30 minutes on 5 or more days each week.  Do not use any products that contain nicotine or tobacco, such as cigarettes, e-cigarettes, and chewing tobacco. If you need help quitting, ask your health care provider.  If you are sexually active, practice safe sex. Use a condom or other form of protection to prevent STIs  (sexually transmitted infections).  Talk with your health care provider about taking a low-dose aspirin or statin. What's next?  Visit your health care provider once a year for a well check visit.  Ask your health care provider how often you should have your eyes and teeth checked.  Stay up to date on all vaccines. This information is not intended to replace advice given to you by your health care provider. Make sure you discuss any questions you have with your health care provider. Document Revised: 12/29/2017 Document Reviewed: 12/29/2017 Elsevier Patient Education  2020 [employer].

## 2019-05-06 ENCOUNTER — Encounter: Payer: Self-pay | Admitting: Family Medicine

## 2019-05-09 ENCOUNTER — Encounter: Payer: Self-pay | Admitting: Family Medicine

## 2019-05-10 DIAGNOSIS — M545 Low back pain: Secondary | ICD-10-CM | POA: Diagnosis not present

## 2019-05-10 DIAGNOSIS — S300XXA Contusion of lower back and pelvis, initial encounter: Secondary | ICD-10-CM | POA: Diagnosis not present

## 2019-05-10 DIAGNOSIS — S39012A Strain of muscle, fascia and tendon of lower back, initial encounter: Secondary | ICD-10-CM | POA: Diagnosis not present

## 2019-05-10 NOTE — Telephone Encounter (Signed)
Spoke with patient who verbally understood no appointment available at the office. Advised patient that it does sound like he needs to be seen and he should go to ED or urgent care. Patient states that he does not want to be seen at either. Let patient know that we were booked until next week and he should not stay in pain this long to wait patient states that he will be leaving to go out of town this weekend. Encouraged patient to be seen at ED for eval. Patient states that he will see about going.

## 2019-05-10 NOTE — Telephone Encounter (Signed)
Pt called in and wanted to speak to [place], I transferred pt to Her

## 2019-05-31 DIAGNOSIS — L4050 Arthropathic psoriasis, unspecified: Secondary | ICD-10-CM | POA: Diagnosis not present

## 2019-05-31 DIAGNOSIS — Z79899 Other long term (current) drug therapy: Secondary | ICD-10-CM | POA: Diagnosis not present

## 2019-05-31 DIAGNOSIS — L409 Psoriasis, unspecified: Secondary | ICD-10-CM | POA: Diagnosis not present

## 2019-05-31 DIAGNOSIS — Z6830 Body mass index (BMI) 30.0-30.9, adult: Secondary | ICD-10-CM | POA: Diagnosis not present

## 2019-05-31 DIAGNOSIS — E669 Obesity, unspecified: Secondary | ICD-10-CM | POA: Diagnosis not present

## 2019-05-31 DIAGNOSIS — M150 Primary generalized (osteo)arthritis: Secondary | ICD-10-CM | POA: Diagnosis not present

## 2019-06-11 ENCOUNTER — Encounter: Payer: Self-pay | Admitting: Family Medicine

## 2019-06-12 DIAGNOSIS — L4050 Arthropathic psoriasis, unspecified: Secondary | ICD-10-CM | POA: Diagnosis not present

## 2019-06-12 LAB — COMPREHENSIVE METABOLIC PANEL
Albumin: 4.4 (ref 3.5–5.0)
Calcium: 9.6 (ref 8.7–10.7)
GFR calc Af Amer: 96
GFR calc non Af Amer: 83
Globulin: 2.3

## 2019-06-12 LAB — HEPATIC FUNCTION PANEL
ALT: 16 (ref 10–40)
AST: 23 (ref 14–40)
Alkaline Phosphatase: 51 (ref 25–125)
Bilirubin, Total: 0.5

## 2019-06-12 LAB — CBC AND DIFFERENTIAL
HCT: 43 (ref 41–53)
Hemoglobin: 14.8 (ref 13.5–17.5)
Neutrophils Absolute: 2
Platelets: 223 (ref 150–399)
WBC: 4.3

## 2019-06-12 LAB — BASIC METABOLIC PANEL
BUN: 17 (ref 4–21)
CO2: 17 (ref 13–22)
Chloride: 108 (ref 99–108)
Creatinine: 0.9 (ref 0.6–1.3)
Glucose: 81
Potassium: 5 (ref 3.4–5.3)
Sodium: 140 (ref 137–147)

## 2019-06-12 LAB — CBC: RBC: 4.72 (ref 3.87–5.11)

## 2019-06-13 ENCOUNTER — Encounter: Payer: Self-pay | Admitting: *Deleted

## 2019-06-15 ENCOUNTER — Other Ambulatory Visit: Payer: Self-pay

## 2019-06-15 ENCOUNTER — Encounter: Payer: Self-pay | Admitting: Family Medicine

## 2019-06-15 ENCOUNTER — Telehealth: Payer: Self-pay | Admitting: Neurology

## 2019-06-15 ENCOUNTER — Encounter: Payer: Self-pay | Admitting: Neurology

## 2019-06-15 ENCOUNTER — Ambulatory Visit (INDEPENDENT_AMBULATORY_CARE_PROVIDER_SITE_OTHER): Payer: Medicare Other | Admitting: Neurology

## 2019-06-15 VITALS — BP 135/83 | HR 69 | Ht 70.0 in | Wt 214.0 lb

## 2019-06-15 DIAGNOSIS — M79605 Pain in left leg: Secondary | ICD-10-CM | POA: Diagnosis not present

## 2019-06-15 DIAGNOSIS — M4807 Spinal stenosis, lumbosacral region: Secondary | ICD-10-CM | POA: Diagnosis not present

## 2019-06-15 DIAGNOSIS — Z6830 Body mass index (BMI) 30.0-30.9, adult: Secondary | ICD-10-CM | POA: Diagnosis not present

## 2019-06-15 DIAGNOSIS — Z20828 Contact with and (suspected) exposure to other viral communicable diseases: Secondary | ICD-10-CM | POA: Diagnosis not present

## 2019-06-15 DIAGNOSIS — Z20822 Contact with and (suspected) exposure to covid-19: Secondary | ICD-10-CM | POA: Diagnosis not present

## 2019-06-15 DIAGNOSIS — M79604 Pain in right leg: Secondary | ICD-10-CM

## 2019-06-15 DIAGNOSIS — R200 Anesthesia of skin: Secondary | ICD-10-CM | POA: Diagnosis not present

## 2019-06-15 MED ORDER — LAMOTRIGINE 100 MG PO TABS: 100.0000 mg | ORAL_TABLET | Freq: Two times a day (BID) | ORAL | 5 refills | Status: DC

## 2019-06-15 NOTE — Progress Notes (Signed)
[facility]  PATIENT: Anthony Greene DOB: March 08, 1950  REFERRING DOCTOR OR PCP:  Abelino Derrick, MD SOURCE: patient, notes form PCP, labs  _________________________________   HISTORICAL  CHIEF COMPLAINT:  Chief Complaint  Patient presents with  . Follow-up    paresthesia in both feet- pt states while walking feet go numb with some pain    HISTORY OF PRESENT ILLNESS:  I had the pleasure seeing your patient, Anthony Greene, [facility] for neurologic consultation regarding his painful numbness.  He is a 69 year old man who has had the gradual onset of numbness in his feet.  Most of the time, numbness is just in the feet without pain but dysesthesias enter the ankles if he goes further.  The numbness is more on the lateral aspect of the foot than the dorsum.  Mild pain is achy.   Symptoms are not well appreciated much of the time, especially if he sits or is moving around.    It is worse with extended walking or standing.   Gabapentin had not helped and it made him sleepy  He feels the pain affects his lifestyle as has limited [place] walks [place] that he enjoys.  Balance is fine.   Feet are strong    He can close his eyes without swaying.    He has no bladder difficulties  His psoriatic arthritis was diagnosed in 1999.   He initially was on Enbrel but more recently switched to Simponi and is well controlled.   He is active and walks some and plays golf.       Two years ago he fell and broke the transverse processes from L1 through L4.  He hasd a lot of pain, now resolved.     He also has facet hypertrophj, worse at L4L5 and L5S1.   He has minimal L2L3L4 retrolishthesis.     I personally reviewed the CT scan of his pelvis from 2019.  He appears to have significant spinal stenosis at L4-L5 with facet hypertrophy, ligamenta flava hypertrophy and disc protrusion.  Mild spinal stenosis at L3-L4 and degenerative changes at L5-S1.  B12 was low normal  at 293.  Standard lab tests were normal.     REVIEW OF SYSTEMS: Constitutional: No fevers, chills, sweats, or change in appetite Eyes: No visual changes, double vision, eye pain Ear, nose and throat: No hearing loss, ear pain, nasal congestion, sore throat Cardiovascular: No chest pain, palpitations Respiratory: No shortness of breath at rest or with exertion.   No wheezes GastrointestinaI: No nausea, vomiting, diarrhea, abdominal pain, fecal incontinence Genitourinary: No dysuria, urinary retention or frequency.  No nocturia. Musculoskeletal: No neck pain, back pain Integumentary: No rash, pruritus, skin lesions.  Psoriatic arthritis well-controlled on current meds Neurological: as above Psychiatric: No depression at this time.  No anxiety Endocrine: No palpitations, diaphoresis, change in appetite, change in weigh or increased thirst Hematologic/Lymphatic: No anemia, purpura, petechiae. Allergic/Immunologic: No itchy/runny eyes, nasal congestion, recent allergic reactions, rashes  ALLERGIES: Allergies  Allergen Reactions  . Dilaudid [Hydromorphone Hcl] Nausea And Vomiting  . Morphine And Related Nausea And Vomiting  . Vancomycin Other (See Comments)    redman syndrome  . Penicillins Rash    Patient has only had rash reaction to PCN in the past.  Patient and wife state he Tolerates keflex and  Ancef with no problem.  Has patient had a PCN reaction causing immediate rash, facial/tongue/throat swelling, SOB or lightheadedness with hypotension: Yes- rash only Has  patient had a PCN reaction causing severe rash involving mucus membranes or skin necrosis: No Has patient had a PCN reaction that required hospitalization: No Has patient had a PCN reaction occurring within the last 10 years: No    HOME MEDICATIONS:  Current Outpatient Medications:  .  celecoxib (CELEBREX) 200 MG capsule, Take 200 mg by mouth at bedtime. , Disp: , Rfl:  .  Melatonin 3 MG CAPS, Take 3 mg by mouth at  bedtime. , Disp: , Rfl:  .  tamsulosin (FLOMAX) 0.4 MG CAPS capsule, TAKE 1 CAPSULE BY MOUTH DAILY., Disp: 90 capsule, Rfl: 0  PAST MEDICAL HISTORY: Past Medical History:  Diagnosis Date  . Arthritis   . Closed fracture of transverse process of lumbar vertebra ([place]) 06/06/2017  . Hematoma of hip, right, initial encounter 06/23/2017  . Injury resulting from fall from height 06/07/2017  . Intractable low back pain 06/07/2017  . Neuropathy   . Osteoarthritis   . Pilonidal abscess 06/22/2017  . Pilonidal cyst 06/22/2017  . Psoriasis   . Psoriatic arthritis ([place])   . Sebaceous cyst     PAST SURGICAL HISTORY: Past Surgical History:  Procedure Laterality Date  . CARPAL TUNNEL RELEASE Right   . COLONOSCOPY WITH PROPOFOL    . DRESSING CHANGE UNDER ANESTHESIA N/A 06/25/2017   Procedure: DRESSING CHANGE UNDER ANESTHESIA;  Surgeon: Florene Glen, MD;  Location: [facility] ORS;  Service: General;  Laterality: N/A;  . IRRIGATION AND DEBRIDEMENT BUTTOCKS Right 06/23/2017   Procedure: IRRIGATION AND DEBRIDEMENT BUTTOCKS;  Surgeon: Vickie Epley, MD;  Location: [facility] ORS;  Service: General;  Laterality: Right;  . JOINT REPLACEMENT     BL knee  . KNEE ARTHROSCOPY Right   . PILONIDAL CYST EXCISION N/A 01/20/2018   Procedure: EXCISION OF INCREASINGLY SYMPTOMATIC PILONIDAL CYST AND RIGHT OF UPPER BACK SEBACEOUS CYST;  Surgeon: Vickie Epley, MD;  Location: [facility] ORS;  Service: General;  Laterality: N/A;  . TONSILLECTOMY      FAMILY HISTORY: Family History  Problem Relation Age of Onset  . Asthma Mother   . COPD Mother   . Stroke Father   . Heart disease Father   . Cancer Brother     SOCIAL HISTORY:  Social History   Socioeconomic History  . Marital status: Married    Spouse name: Not on file  . Number of children: Not on file  . Years of education: Not on file  . Highest education level: Not on file  Occupational History  . Not on file  Tobacco Use  . Smoking status: Former Smoker     Types: Cigars  . Smokeless tobacco: Never Used  Substance and Sexual Activity  . Alcohol use: Yes    Comment: 1-3 alcoholic drinks daily  . Drug use: Never  . Sexual activity: Yes  Other Topics Concern  . Not on file  Social History Narrative  . Not on file   Social Determinants of Health   Financial Resource Strain: Low Risk   . Difficulty of Paying Living Expenses: Not hard at all  Food Insecurity: No Food Insecurity  . Worried About Charity fundraiser in the Last Year: Never true  . Ran Out of Food in the Last Year: Never true  Transportation Needs: No Transportation Needs  . Lack of Transportation (Medical): No  . Lack of Transportation (Non-Medical): No  Physical Activity:   . Days of Exercise per Week:   . Minutes of Exercise per Session:   Stress:   .  Feeling of Stress :   Social Connections:   . Frequency of Communication with Friends and Family:   . Frequency of Social Gatherings with Friends and Family:   . Attends Religious Services:   . Active Member of Clubs or Organizations:   . Attends Archivist Meetings:   Marland Kitchen Marital Status:   Intimate Partner Violence:   . Fear of Current or Ex-Partner:   . Emotionally Abused:   Marland Kitchen Physically Abused:   . Sexually Abused:      PHYSICAL EXAM  Vitals:   06/15/19 0820  BP: 135/83  Pulse: 69  Weight: 214 lb (97.1 kg)  Height: 5' 10"  (1.778 m)    Body mass index is 30.71 kg/m.   General: The patient is well-developed and well-nourished and in no acute distress  HEENT:  Head is [place]/AT.  Sclera are anicteric.     Neck: No carotid bruits are noted.  The neck has good range of motion and is nontender  Cardiovascular: The heart has a regular rate and rhythm with a normal S1 and S2. There were no murmurs, gallops or rubs.    Skin: Extremities are without rash or  edema.  Musculoskeletal:  Back is nontender  Neurologic Exam  Mental status: The patient is alert and oriented x 3 at the time of the  examination. The patient has apparent normal recent and remote memory, with an apparently normal attention span and concentration ability.   Speech is normal.  Cranial nerves: Extraocular movements are full.   Facial symmetry is present.  Facial strength is normal trapezius and sternocleidomastoid strength is normal. No dysarthria is noted  No obvious hearing deficits are noted.  Motor:  Muscle bulk is normal.   Tone is normal. Strength is  5 / 5 in all 4 extremities.   Sensory: Sensory testing is intact to pinprick, soft touch and vibration sensation in the proximal leg.  He had reduced pinprick sensation in an S1 distribution, more so in the foot than more proximal.  There was only mild generalized reduced sensation of 10 to 20% to pinprick and vibration in the L5 dermatome as  Coordination: Cerebellar testing reveals good finger-nose-finger and heel-to-shin bilaterally.  Gait and station: Station is normal.   Gait is normal. Tandem gait is normal. Romberg is negative.    He is able to walk on his toes and to walk on his heels.  He can squat and rise without difficulty.  Reflexes: Deep tendon reflexes are symmetric and normal in arms but absent at the knees and ankles.   Plantar responses are flexor.    DIAGNOSTIC DATA (LABS, IMAGING, TESTING) - I reviewed patient records, labs, notes, testing and imaging myself where available.    Lab Results  Component Value Date   VITAMINB12 293 02/26/2019   Lab Results  Component Value Date   TSH 1.87 02/26/2019       ASSESSMENT AND PLAN  Numbness - Plan: NCV with EMG(electromyography), Multiple Myeloma Panel (SPEP&IFE w/QIG), Cryoglobulin  Spinal stenosis of lumbosacral region - Plan: MR LUMBAR SPINE WO CONTRAST, NCV with EMG(electromyography)  Pain in both lower extremities - Plan: NCV with EMG(electromyography), Multiple Myeloma Panel (SPEP&IFE w/QIG), Cryoglobulin   In summary, Anthony Greene is a 69 year old man with psoriatic  arthritis who has had progressive numbness and pain in his feet.  Numbness was noted more in the S1 distribution in the L5 distribution of both feet pain increases with extended standing and walking and develops a more  cramp-like quality.  Numbness tends to be mild and does not bother him when he is sitting.  He has no pain when he sits.  I discussed with him that the combination of his symptoms could be consistent with spinal stenosis and we know he has significant lower lumbar degenerative changes on CT scan.  I cannot rule out a mild small fiber polyneuropathy that is superimposed.  Therefore we need to do an MRI of the lumbar spine and NCV/EMG study to further evaluate.  I will check him for SPEP/IEF and cryoglobulins to assess for immune mediated polyneuropathy, such as MGUS.     I will see him when he returns for the NCV/EMG study.  We will let him know the results of the lab work and the MRI after we get results.  I discussed with him that he does have significant spinal stenosis and it is affecting his quality of life that we could consider referral to neurosurgery or other interventions.  Thank you for asking me to see Anthony Greene.  Please let me know if I can be of further assistance with him or other patients in the future.   Earnestine Shipp A. Felecia Shelling, MD, [facility] 0/09/2328, 0:76 AM Certified in Neurology, Clinical Neurophysiology, Sleep Medicine and Neuroimaging  [facility] [address] [place], [place] [address] [phone]

## 2019-06-15 NOTE — Telephone Encounter (Signed)
Medicare/cigna supp no auth order sent to GI. They will reach out to the patient to schedule.

## 2019-06-15 NOTE — Patient Instructions (Signed)
The pharmacy has the prescription for lamotrigine 100 mg tablets. For 5 days, just take one half pill a day. For the next 5 days, take one half pill twice a day. For the next 5 days, take one half pill 3 times a day Then start taking one pill twice a day from this point on.    In the future, we may increase the dose further.  If you get a rash, need to stop the medication and not take it again. 

## 2019-06-21 LAB — MULTIPLE MYELOMA PANEL, SERUM
Albumin SerPl Elph-Mcnc: 3.7 g/dL (ref 2.9–4.4)
Albumin/Glob SerPl: 1.3 (ref 0.7–1.7)
Alpha 1: 0.2 g/dL (ref 0.0–0.4)
Alpha2 Glob SerPl Elph-Mcnc: 0.6 g/dL (ref 0.4–1.0)
B-Globulin SerPl Elph-Mcnc: 1.1 g/dL (ref 0.7–1.3)
Gamma Glob SerPl Elph-Mcnc: 1.1 g/dL (ref 0.4–1.8)
Globulin, Total: 3 g/dL (ref 2.2–3.9)
IgA/Immunoglobulin A, Serum: 292 mg/dL (ref 61–437)
IgG (Immunoglobin G), Serum: 1081 mg/dL (ref 603–1613)
IgM (Immunoglobulin M), Srm: 42 mg/dL (ref 20–172)
Total Protein: 6.7 g/dL (ref 6.0–8.5)

## 2019-06-21 LAB — CRYOGLOBULIN

## 2019-06-25 ENCOUNTER — Other Ambulatory Visit: Payer: Self-pay | Admitting: Neurology

## 2019-06-25 ENCOUNTER — Ambulatory Visit
Admission: RE | Admit: 2019-06-25 | Discharge: 2019-06-25 | Disposition: A | Discharge status: Home / Self Care | Payer: Medicare Other | Source: Ambulatory Visit | Attending: Neurology | Admitting: Neurology

## 2019-06-25 ENCOUNTER — Other Ambulatory Visit: Payer: Self-pay

## 2019-06-25 DIAGNOSIS — M4807 Spinal stenosis, lumbosacral region: Secondary | ICD-10-CM | POA: Diagnosis not present

## 2019-06-25 DIAGNOSIS — M795 Residual foreign body in soft tissue: Secondary | ICD-10-CM

## 2019-06-25 DIAGNOSIS — Z01818 Encounter for other preprocedural examination: Secondary | ICD-10-CM | POA: Diagnosis not present

## 2019-06-26 ENCOUNTER — Telehealth: Payer: Self-pay | Admitting: *Deleted

## 2019-06-26 NOTE — Telephone Encounter (Signed)
-----   Message from Britt Bottom, MD sent at 06/25/2019  6:05 PM EDT ----- Please let him know that the MRI of the lumbar spine does show severe spinal stenosis at L4-L5 which is probably explaining his symptoms.  Therefore, I would like to refer him to neurosurgery

## 2019-06-27 ENCOUNTER — Telehealth: Payer: Self-pay | Admitting: Neurology

## 2019-06-27 DIAGNOSIS — M4807 Spinal stenosis, lumbosacral region: Secondary | ICD-10-CM

## 2019-06-27 DIAGNOSIS — R202 Paresthesia of skin: Secondary | ICD-10-CM

## 2019-06-27 DIAGNOSIS — R252 Cramp and spasm: Secondary | ICD-10-CM

## 2019-06-27 NOTE — Telephone Encounter (Signed)
I discussed the results of the lumbar MRI with Anthony Greene.  It showed severe spinal stenosis at L4-L5.  There is only a 4.5 mm diameter and this is likely the source of his symptoms.  I recommend that he see neurosurgery for consultation regarding possible surgery.  He would like to see Dr. Vertell Limber.

## 2019-06-30 ENCOUNTER — Other Ambulatory Visit: Payer: Self-pay | Admitting: Family Medicine

## 2019-06-30 DIAGNOSIS — R3911 Hesitancy of micturition: Secondary | ICD-10-CM

## 2019-06-30 DIAGNOSIS — N401 Enlarged prostate with lower urinary tract symptoms: Secondary | ICD-10-CM

## 2019-07-05 NOTE — Telephone Encounter (Signed)
FYI- Patient called today and cancelled his NCV/EMG due to his MRI results.

## 2019-07-17 ENCOUNTER — Encounter: Payer: Medicare Other | Admitting: Neurology

## 2019-08-07 DIAGNOSIS — L4050 Arthropathic psoriasis, unspecified: Secondary | ICD-10-CM | POA: Diagnosis not present

## 2019-08-20 DIAGNOSIS — Z6831 Body mass index (BMI) 31.0-31.9, adult: Secondary | ICD-10-CM | POA: Diagnosis not present

## 2019-08-20 DIAGNOSIS — R202 Paresthesia of skin: Secondary | ICD-10-CM | POA: Diagnosis not present

## 2019-08-20 DIAGNOSIS — M47816 Spondylosis without myelopathy or radiculopathy, lumbar region: Secondary | ICD-10-CM | POA: Diagnosis not present

## 2019-08-20 DIAGNOSIS — M4316 Spondylolisthesis, lumbar region: Secondary | ICD-10-CM | POA: Diagnosis not present

## 2019-08-20 DIAGNOSIS — M48061 Spinal stenosis, lumbar region without neurogenic claudication: Secondary | ICD-10-CM | POA: Diagnosis not present

## 2019-08-20 DIAGNOSIS — R030 Elevated blood-pressure reading, without diagnosis of hypertension: Secondary | ICD-10-CM | POA: Diagnosis not present

## 2019-08-22 ENCOUNTER — Encounter: Payer: Self-pay | Admitting: Family Medicine

## 2019-09-01 ENCOUNTER — Encounter: Payer: Self-pay | Admitting: Family Medicine

## 2019-09-03 ENCOUNTER — Telehealth: Payer: Self-pay

## 2019-09-03 DIAGNOSIS — Z23 Encounter for immunization: Secondary | ICD-10-CM | POA: Diagnosis not present

## 2019-09-03 NOTE — Telephone Encounter (Signed)
Please advise message below, if booster needed do you know where he could have this done at?   Since I am getting infusions of Semponi Aria for my arthritis, should I get a COVID-19 booster shot?

## 2019-09-04 NOTE — Telephone Encounter (Signed)
For folks who have had direct damage to immune system or on immunosuppresents for organ transplantation. Please also check with your doctor.

## 2019-09-11 DIAGNOSIS — R200 Anesthesia of skin: Secondary | ICD-10-CM | POA: Diagnosis not present

## 2019-09-11 DIAGNOSIS — G8929 Other chronic pain: Secondary | ICD-10-CM | POA: Diagnosis not present

## 2019-09-11 DIAGNOSIS — M5386 Other specified dorsopathies, lumbar region: Secondary | ICD-10-CM | POA: Diagnosis not present

## 2019-09-11 DIAGNOSIS — M79604 Pain in right leg: Secondary | ICD-10-CM | POA: Diagnosis not present

## 2019-09-11 DIAGNOSIS — M4316 Spondylolisthesis, lumbar region: Secondary | ICD-10-CM | POA: Diagnosis not present

## 2019-09-11 DIAGNOSIS — M545 Low back pain: Secondary | ICD-10-CM | POA: Diagnosis not present

## 2019-09-11 DIAGNOSIS — M6281 Muscle weakness (generalized): Secondary | ICD-10-CM | POA: Diagnosis not present

## 2019-09-11 DIAGNOSIS — M79605 Pain in left leg: Secondary | ICD-10-CM | POA: Diagnosis not present

## 2019-09-12 DIAGNOSIS — R200 Anesthesia of skin: Secondary | ICD-10-CM | POA: Diagnosis not present

## 2019-09-12 DIAGNOSIS — M5386 Other specified dorsopathies, lumbar region: Secondary | ICD-10-CM | POA: Diagnosis not present

## 2019-09-12 DIAGNOSIS — G8929 Other chronic pain: Secondary | ICD-10-CM | POA: Diagnosis not present

## 2019-09-12 DIAGNOSIS — M545 Low back pain: Secondary | ICD-10-CM | POA: Diagnosis not present

## 2019-09-12 DIAGNOSIS — M6281 Muscle weakness (generalized): Secondary | ICD-10-CM | POA: Diagnosis not present

## 2019-09-12 DIAGNOSIS — M4316 Spondylolisthesis, lumbar region: Secondary | ICD-10-CM | POA: Diagnosis not present

## 2019-09-12 DIAGNOSIS — M79605 Pain in left leg: Secondary | ICD-10-CM | POA: Diagnosis not present

## 2019-09-12 DIAGNOSIS — M79604 Pain in right leg: Secondary | ICD-10-CM | POA: Diagnosis not present

## 2019-09-13 ENCOUNTER — Other Ambulatory Visit: Payer: Self-pay | Admitting: Family Medicine

## 2019-09-13 DIAGNOSIS — R3911 Hesitancy of micturition: Secondary | ICD-10-CM

## 2019-09-13 DIAGNOSIS — N401 Enlarged prostate with lower urinary tract symptoms: Secondary | ICD-10-CM

## 2019-09-20 DIAGNOSIS — M79604 Pain in right leg: Secondary | ICD-10-CM | POA: Diagnosis not present

## 2019-09-20 DIAGNOSIS — M4316 Spondylolisthesis, lumbar region: Secondary | ICD-10-CM | POA: Diagnosis not present

## 2019-09-20 DIAGNOSIS — R200 Anesthesia of skin: Secondary | ICD-10-CM | POA: Diagnosis not present

## 2019-09-20 DIAGNOSIS — M79605 Pain in left leg: Secondary | ICD-10-CM | POA: Diagnosis not present

## 2019-09-20 DIAGNOSIS — M6281 Muscle weakness (generalized): Secondary | ICD-10-CM | POA: Diagnosis not present

## 2019-09-20 DIAGNOSIS — G8929 Other chronic pain: Secondary | ICD-10-CM | POA: Diagnosis not present

## 2019-09-20 DIAGNOSIS — M545 Low back pain: Secondary | ICD-10-CM | POA: Diagnosis not present

## 2019-09-20 DIAGNOSIS — M5386 Other specified dorsopathies, lumbar region: Secondary | ICD-10-CM | POA: Diagnosis not present

## 2019-10-08 DIAGNOSIS — Z79899 Other long term (current) drug therapy: Secondary | ICD-10-CM | POA: Diagnosis not present

## 2019-10-08 DIAGNOSIS — L4050 Arthropathic psoriasis, unspecified: Secondary | ICD-10-CM | POA: Diagnosis not present

## 2019-10-29 DIAGNOSIS — M48061 Spinal stenosis, lumbar region without neurogenic claudication: Secondary | ICD-10-CM | POA: Diagnosis not present

## 2019-10-31 DIAGNOSIS — M48061 Spinal stenosis, lumbar region without neurogenic claudication: Secondary | ICD-10-CM | POA: Diagnosis not present

## 2019-10-31 DIAGNOSIS — M47816 Spondylosis without myelopathy or radiculopathy, lumbar region: Secondary | ICD-10-CM | POA: Diagnosis not present

## 2019-10-31 DIAGNOSIS — M4316 Spondylolisthesis, lumbar region: Secondary | ICD-10-CM | POA: Diagnosis not present

## 2019-11-05 DIAGNOSIS — M48062 Spinal stenosis, lumbar region with neurogenic claudication: Secondary | ICD-10-CM | POA: Diagnosis not present

## 2019-11-08 DIAGNOSIS — Z23 Encounter for immunization: Secondary | ICD-10-CM | POA: Diagnosis not present

## 2019-11-09 DIAGNOSIS — Z1152 Encounter for screening for COVID-19: Secondary | ICD-10-CM | POA: Diagnosis not present

## 2019-11-25 ENCOUNTER — Other Ambulatory Visit: Payer: Self-pay | Admitting: Family Medicine

## 2019-11-25 DIAGNOSIS — N401 Enlarged prostate with lower urinary tract symptoms: Secondary | ICD-10-CM

## 2019-12-07 DIAGNOSIS — L4050 Arthropathic psoriasis, unspecified: Secondary | ICD-10-CM | POA: Diagnosis not present

## 2020-01-13 ENCOUNTER — Encounter: Payer: Self-pay | Admitting: Family Medicine

## 2020-01-14 ENCOUNTER — Encounter: Payer: Self-pay | Admitting: Family Medicine

## 2020-01-14 ENCOUNTER — Ambulatory Visit (INDEPENDENT_AMBULATORY_CARE_PROVIDER_SITE_OTHER): Payer: Medicare Other

## 2020-01-14 ENCOUNTER — Encounter: Payer: Self-pay | Admitting: Family

## 2020-01-14 ENCOUNTER — Ambulatory Visit (INDEPENDENT_AMBULATORY_CARE_PROVIDER_SITE_OTHER): Payer: Medicare Other | Admitting: Family

## 2020-01-14 ENCOUNTER — Other Ambulatory Visit: Payer: Self-pay

## 2020-01-14 VITALS — BP 126/68 | HR 65 | Temp 97.5°F | Ht 70.0 in | Wt 225.6 lb

## 2020-01-14 DIAGNOSIS — M7989 Other specified soft tissue disorders: Secondary | ICD-10-CM | POA: Diagnosis not present

## 2020-01-14 DIAGNOSIS — R200 Anesthesia of skin: Secondary | ICD-10-CM

## 2020-01-14 DIAGNOSIS — R202 Paresthesia of skin: Secondary | ICD-10-CM

## 2020-01-14 DIAGNOSIS — M25512 Pain in left shoulder: Secondary | ICD-10-CM | POA: Diagnosis not present

## 2020-01-14 DIAGNOSIS — M47812 Spondylosis without myelopathy or radiculopathy, cervical region: Secondary | ICD-10-CM | POA: Diagnosis not present

## 2020-01-14 DIAGNOSIS — M19042 Primary osteoarthritis, left hand: Secondary | ICD-10-CM | POA: Diagnosis not present

## 2020-01-14 DIAGNOSIS — M79645 Pain in left finger(s): Secondary | ICD-10-CM | POA: Diagnosis not present

## 2020-01-14 DIAGNOSIS — G8929 Other chronic pain: Secondary | ICD-10-CM

## 2020-01-14 DIAGNOSIS — M4312 Spondylolisthesis, cervical region: Secondary | ICD-10-CM | POA: Diagnosis not present

## 2020-01-14 MED ORDER — PREDNISONE 10 MG (21) PO TBPK
ORAL_TABLET | ORAL | 0 refills | Status: DC
Start: 2020-01-14 — End: 2020-06-27

## 2020-01-14 NOTE — Progress Notes (Signed)
Acute Office Visit  Subjective:    Patient ID: Anthony Greene, male    DOB: 1950-06-02, 69 y.o.   MRN: [id number]  Chief Complaint  Patient presents with  . Tingling    C/O tingling in left thumb which radiates up to left shoulder with some pains x few weeks come and go.     HPI Patient is in today with c/o tingling in his thumb that radiates up to his left arm with pain x 3 weeks that comes and goes several times a day. He has a history of injury to the left thumb approx 40 years ago, possible fracture but was never treated. Pain is 5/10, worse with sitting still and better with movement. Denies any h/o neck pain but has tension in his neck when he turns to the left and tightness in his left shoulder. Has a history of lumbar radiculopathy    Past Medical History:  Diagnosis Date  . Arthritis   . Closed fracture of transverse process of lumbar vertebra ([place]) 06/06/2017  . Hematoma of hip, right, initial encounter 06/23/2017  . Injury resulting from fall from height 06/07/2017  . Intractable low back pain 06/07/2017  . Neuropathy   . Osteoarthritis   . Pilonidal abscess 06/22/2017  . Pilonidal cyst 06/22/2017  . Psoriasis   . Psoriatic arthritis ([place])   . Sebaceous cyst     Past Surgical History:  Procedure Laterality Date  . CARPAL TUNNEL RELEASE Right   . COLONOSCOPY WITH PROPOFOL    . DRESSING CHANGE UNDER ANESTHESIA N/A 06/25/2017   Procedure: DRESSING CHANGE UNDER ANESTHESIA;  Surgeon: Anthony Glen, MD;  Location: [facility] ORS;  Service: General;  Laterality: N/A;  . IRRIGATION AND DEBRIDEMENT BUTTOCKS Right 06/23/2017   Procedure: IRRIGATION AND DEBRIDEMENT BUTTOCKS;  Surgeon: Anthony Epley, MD;  Location: [facility] ORS;  Service: General;  Laterality: Right;  . JOINT REPLACEMENT     BL knee  . KNEE ARTHROSCOPY Right   . PILONIDAL CYST EXCISION N/A 01/20/2018   Procedure: EXCISION OF INCREASINGLY SYMPTOMATIC PILONIDAL CYST AND RIGHT OF UPPER BACK SEBACEOUS CYST;  Surgeon: Anthony Epley, MD;  Location: [facility] ORS;  Service: General;  Laterality: N/A;  . TONSILLECTOMY      Family History  Problem Relation Age of Onset  . Asthma Mother   . COPD Mother   . Stroke Father   . Heart disease Father   . Cancer Brother     Social History   Socioeconomic History  . Marital status: Married    Spouse name: Not on file  . Number of children: Not on file  . Years of education: Not on file  . Highest education level: Not on file  Occupational History  . Not on file  Tobacco Use  . Smoking status: Former Smoker    Types: Cigars  . Smokeless tobacco: Never Used  Substance and Sexual Activity  . Alcohol use: Yes    Comment: 1-3 alcoholic drinks daily  . Drug use: Never  . Sexual activity: Yes  Other Topics Concern  . Not on file  Social History Narrative  . Not on file   Social Determinants of Health   Financial Resource Strain: Low Risk   . Difficulty of Paying Living Expenses: Not hard at all  Food Insecurity: No Food Insecurity  . Worried About Charity fundraiser in the Last Year: Never true  . Ran Out of Food in the Last Year: Never true  Transportation Needs:  No Transportation Needs  . Lack of Transportation (Medical): No  . Lack of Transportation (Non-Medical): No  Physical Activity: Not on file  Stress: Not on file  Social Connections: Not on file  Intimate Partner Violence: Not on file    Outpatient Medications Prior to Visit  Medication Sig Dispense Refill  . celecoxib (CELEBREX) 200 MG capsule Take 200 mg by mouth at bedtime.     . Melatonin 3 MG CAPS Take 3 mg by mouth at bedtime.     . pregabalin (LYRICA) 50 MG capsule Take by mouth.    . tamsulosin (FLOMAX) 0.4 MG CAPS capsule TAKE 1 CAPSULE EVERY DAY 90 capsule 0  . lamoTRIgine (LAMICTAL) 100 MG tablet Take 1 tablet (100 mg total) by mouth 2 (two) times daily. 60 tablet 5   No facility-administered medications prior to visit.    Allergies  Allergen Reactions  . Dilaudid  [Hydromorphone Hcl] Nausea And Vomiting  . Morphine And Related Nausea And Vomiting  . Vancomycin Other (See Comments)    redman syndrome  . Penicillins Rash    Patient has only had rash reaction to PCN in the past.  Patient and wife state he Tolerates keflex and  Ancef with no problem.  Has patient had a PCN reaction causing immediate rash, facial/tongue/throat swelling, SOB or lightheadedness with hypotension: Yes- rash only Has patient had a PCN reaction causing severe rash involving mucus membranes or skin necrosis: No Has patient had a PCN reaction that required hospitalization: No Has patient had a PCN reaction occurring within the last 10 years: No    Review of Systems  Constitutional: Negative.   Respiratory: Negative.   Cardiovascular: Negative.   Gastrointestinal: Positive for abdominal distention.  Musculoskeletal: Positive for arthralgias and neck stiffness.       Pain in left thumb, tightness in the left neck and shoulder  Neurological: Positive for numbness.  Hematological: Negative.   Psychiatric/Behavioral: Negative.   All other systems reviewed and are negative.      Objective:    Physical Exam Constitutional:      Appearance: Normal appearance.  Cardiovascular:     Rate and Rhythm: Normal rate and regular rhythm.     Pulses: Normal pulses.     Heart sounds: Normal heart sounds.  Pulmonary:     Effort: Pulmonary effort is normal.     Breath sounds: Normal breath sounds.  Musculoskeletal:     Cervical back: Normal range of motion and neck supple.     Comments: ROM of the neck is normal except turning to the right. Tension noted in the neck and shoulders. Left thumb: deformity (overgrowth) noted from old injury.No redness or tenderness. Negative Phalen and Tinel  Skin:    General: Skin is warm and dry.  Neurological:     General: No focal deficit present.     Mental Status: He is alert and oriented to person, place, and time. Mental status is at baseline.      Sensory: No sensory deficit.     Coordination: Coordination normal.     Deep Tendon Reflexes: Reflexes normal.  Psychiatric:        Mood and Affect: Mood normal.        Behavior: Behavior normal.     BP 126/68   Pulse 65   Temp (!) 97.5 F (36.4 C) (Temporal)   Ht 5\' 10"  (1.778 m)   Wt 225 lb 9.6 oz (102.3 kg)   SpO2 94%   BMI 32.37 kg/m  Wt Readings from Last 3 Encounters:  01/14/20 225 lb 9.6 oz (102.3 kg)  06/15/19 214 lb (97.1 kg)  04/16/19 210 lb (95.3 kg)    Health Maintenance Due  Topic Date Due  . Hepatitis C Screening  Never done    There are no preventive care reminders to display for this patient.   Lab Results  Component Value Date   TSH 1.87 02/26/2019   Lab Results  Component Value Date   WBC 4.3 06/12/2019   HGB 14.8 06/12/2019   HCT 43 06/12/2019   MCV 94.0 02/26/2019   PLT 223 06/12/2019   Lab Results  Component Value Date   NA 140 06/12/2019   K 5.0 06/12/2019   CO2 17 06/12/2019   GLUCOSE 93 12/22/2017   BUN 17 06/12/2019   CREATININE 0.9 06/12/2019   BILITOT 0.9 12/22/2017   ALKPHOS 51 06/12/2019   AST 23 06/12/2019   ALT 16 06/12/2019   PROT 6.7 06/15/2019   ALBUMIN 4.4 06/12/2019   CALCIUM 9.6 06/12/2019   ANIONGAP 9 06/23/2017   GFR 79.81 12/22/2017   Lab Results  Component Value Date   CHOL 183 12/22/2017   Lab Results  Component Value Date   HDL 84.00 12/22/2017   Lab Results  Component Value Date   LDLCALC 88 12/22/2017   Lab Results  Component Value Date   TRIG 52.0 12/22/2017   Lab Results  Component Value Date   CHOLHDL 2 12/22/2017   No results found for: HGBA1C     Assessment & Plan:   Problem List Items Addressed This Visit   None   Visit Diagnoses    Acute pain of left shoulder    -  Primary   Relevant Orders   DG Cervical Spine Complete   DG Finger Thumb Left   Chronic thumb pain, left       Relevant Medications   pregabalin (LYRICA) 50 MG capsule   predniSONE (STERAPRED UNI-PAK 21  TAB) 10 MG (21) TBPK tablet   Other Relevant Orders   DG Cervical Spine Complete   DG Finger Thumb Left   Numbness and tingling in left arm       Relevant Orders   DG Cervical Spine Complete   DG Finger Thumb Left       Meds ordered this encounter  Medications  . predniSONE (STERAPRED UNI-PAK 21 TAB) 10 MG (21) TBPK tablet    Sig: As directed    Dispense:  21 tablet    Refill:  0   Will follow-up pending results.   Kennyth Arnold, FNP

## 2020-01-14 NOTE — Patient Instructions (Signed)

## 2020-01-14 NOTE — Telephone Encounter (Signed)
Please see message and advise.  Thank you. ° °

## 2020-01-15 DIAGNOSIS — Z20822 Contact with and (suspected) exposure to covid-19: Secondary | ICD-10-CM | POA: Diagnosis not present

## 2020-02-01 DIAGNOSIS — L4050 Arthropathic psoriasis, unspecified: Secondary | ICD-10-CM | POA: Diagnosis not present

## 2020-02-08 ENCOUNTER — Other Ambulatory Visit: Payer: Self-pay | Admitting: Family Medicine

## 2020-02-08 DIAGNOSIS — N401 Enlarged prostate with lower urinary tract symptoms: Secondary | ICD-10-CM

## 2020-02-11 ENCOUNTER — Encounter: Payer: Self-pay | Admitting: Family Medicine

## 2020-02-13 DIAGNOSIS — M48062 Spinal stenosis, lumbar region with neurogenic claudication: Secondary | ICD-10-CM | POA: Diagnosis not present

## 2020-02-13 DIAGNOSIS — M4316 Spondylolisthesis, lumbar region: Secondary | ICD-10-CM | POA: Diagnosis not present

## 2020-02-13 DIAGNOSIS — M48061 Spinal stenosis, lumbar region without neurogenic claudication: Secondary | ICD-10-CM | POA: Diagnosis not present

## 2020-02-13 DIAGNOSIS — M47816 Spondylosis without myelopathy or radiculopathy, lumbar region: Secondary | ICD-10-CM | POA: Diagnosis not present

## 2020-02-13 DIAGNOSIS — M5412 Radiculopathy, cervical region: Secondary | ICD-10-CM | POA: Diagnosis not present

## 2020-02-13 DIAGNOSIS — M542 Cervicalgia: Secondary | ICD-10-CM | POA: Diagnosis not present

## 2020-02-18 ENCOUNTER — Other Ambulatory Visit: Payer: Self-pay | Admitting: Neurosurgery

## 2020-02-18 DIAGNOSIS — M5412 Radiculopathy, cervical region: Secondary | ICD-10-CM

## 2020-03-18 DIAGNOSIS — M48062 Spinal stenosis, lumbar region with neurogenic claudication: Secondary | ICD-10-CM | POA: Diagnosis not present

## 2020-03-19 ENCOUNTER — Other Ambulatory Visit: Payer: Self-pay

## 2020-03-19 ENCOUNTER — Ambulatory Visit
Admission: RE | Admit: 2020-03-19 | Discharge: 2020-03-19 | Disposition: A | Discharge status: Home / Self Care | Payer: Medicare Other | Source: Ambulatory Visit | Attending: Neurosurgery | Admitting: Neurosurgery

## 2020-03-19 DIAGNOSIS — M48061 Spinal stenosis, lumbar region without neurogenic claudication: Secondary | ICD-10-CM | POA: Diagnosis not present

## 2020-03-19 DIAGNOSIS — M4316 Spondylolisthesis, lumbar region: Secondary | ICD-10-CM | POA: Diagnosis not present

## 2020-03-19 DIAGNOSIS — M48062 Spinal stenosis, lumbar region with neurogenic claudication: Secondary | ICD-10-CM | POA: Diagnosis not present

## 2020-03-19 DIAGNOSIS — R030 Elevated blood-pressure reading, without diagnosis of hypertension: Secondary | ICD-10-CM | POA: Diagnosis not present

## 2020-03-19 DIAGNOSIS — M4802 Spinal stenosis, cervical region: Secondary | ICD-10-CM | POA: Diagnosis not present

## 2020-03-19 DIAGNOSIS — M542 Cervicalgia: Secondary | ICD-10-CM | POA: Diagnosis not present

## 2020-03-19 DIAGNOSIS — M5412 Radiculopathy, cervical region: Secondary | ICD-10-CM | POA: Diagnosis not present

## 2020-03-19 DIAGNOSIS — Z6831 Body mass index (BMI) 31.0-31.9, adult: Secondary | ICD-10-CM | POA: Diagnosis not present

## 2020-03-19 DIAGNOSIS — M47816 Spondylosis without myelopathy or radiculopathy, lumbar region: Secondary | ICD-10-CM | POA: Diagnosis not present

## 2020-03-20 DIAGNOSIS — L409 Psoriasis, unspecified: Secondary | ICD-10-CM | POA: Diagnosis not present

## 2020-03-20 DIAGNOSIS — Z6831 Body mass index (BMI) 31.0-31.9, adult: Secondary | ICD-10-CM | POA: Diagnosis not present

## 2020-03-20 DIAGNOSIS — Z79899 Other long term (current) drug therapy: Secondary | ICD-10-CM | POA: Diagnosis not present

## 2020-03-20 DIAGNOSIS — M150 Primary generalized (osteo)arthritis: Secondary | ICD-10-CM | POA: Diagnosis not present

## 2020-03-20 DIAGNOSIS — E669 Obesity, unspecified: Secondary | ICD-10-CM | POA: Diagnosis not present

## 2020-03-20 DIAGNOSIS — L4050 Arthropathic psoriasis, unspecified: Secondary | ICD-10-CM | POA: Diagnosis not present

## 2020-03-28 DIAGNOSIS — Z20822 Contact with and (suspected) exposure to covid-19: Secondary | ICD-10-CM | POA: Diagnosis not present

## 2020-03-28 DIAGNOSIS — L4050 Arthropathic psoriasis, unspecified: Secondary | ICD-10-CM | POA: Diagnosis not present

## 2020-04-10 DIAGNOSIS — M47812 Spondylosis without myelopathy or radiculopathy, cervical region: Secondary | ICD-10-CM | POA: Diagnosis not present

## 2020-04-21 ENCOUNTER — Other Ambulatory Visit: Payer: Self-pay | Admitting: Family Medicine

## 2020-04-21 DIAGNOSIS — N401 Enlarged prostate with lower urinary tract symptoms: Secondary | ICD-10-CM

## 2020-04-25 DIAGNOSIS — D225 Melanocytic nevi of trunk: Secondary | ICD-10-CM | POA: Diagnosis not present

## 2020-04-25 DIAGNOSIS — L814 Other melanin hyperpigmentation: Secondary | ICD-10-CM | POA: Diagnosis not present

## 2020-04-25 DIAGNOSIS — D1801 Hemangioma of skin and subcutaneous tissue: Secondary | ICD-10-CM | POA: Diagnosis not present

## 2020-04-25 DIAGNOSIS — D235 Other benign neoplasm of skin of trunk: Secondary | ICD-10-CM | POA: Diagnosis not present

## 2020-04-25 DIAGNOSIS — D2272 Melanocytic nevi of left lower limb, including hip: Secondary | ICD-10-CM | POA: Diagnosis not present

## 2020-04-25 DIAGNOSIS — L821 Other seborrheic keratosis: Secondary | ICD-10-CM | POA: Diagnosis not present

## 2020-04-25 DIAGNOSIS — L570 Actinic keratosis: Secondary | ICD-10-CM | POA: Diagnosis not present

## 2020-04-25 DIAGNOSIS — Z85828 Personal history of other malignant neoplasm of skin: Secondary | ICD-10-CM | POA: Diagnosis not present

## 2020-05-05 DIAGNOSIS — Z6831 Body mass index (BMI) 31.0-31.9, adult: Secondary | ICD-10-CM | POA: Diagnosis not present

## 2020-05-05 DIAGNOSIS — M4316 Spondylolisthesis, lumbar region: Secondary | ICD-10-CM | POA: Diagnosis not present

## 2020-05-05 DIAGNOSIS — R030 Elevated blood-pressure reading, without diagnosis of hypertension: Secondary | ICD-10-CM | POA: Diagnosis not present

## 2020-05-05 DIAGNOSIS — M5412 Radiculopathy, cervical region: Secondary | ICD-10-CM | POA: Diagnosis not present

## 2020-05-05 DIAGNOSIS — M47812 Spondylosis without myelopathy or radiculopathy, cervical region: Secondary | ICD-10-CM | POA: Diagnosis not present

## 2020-05-05 DIAGNOSIS — M542 Cervicalgia: Secondary | ICD-10-CM | POA: Diagnosis not present

## 2020-05-08 DIAGNOSIS — Z23 Encounter for immunization: Secondary | ICD-10-CM | POA: Diagnosis not present

## 2020-05-23 DIAGNOSIS — Z79899 Other long term (current) drug therapy: Secondary | ICD-10-CM | POA: Diagnosis not present

## 2020-05-23 DIAGNOSIS — R5383 Other fatigue: Secondary | ICD-10-CM | POA: Diagnosis not present

## 2020-05-23 DIAGNOSIS — L4050 Arthropathic psoriasis, unspecified: Secondary | ICD-10-CM | POA: Diagnosis not present

## 2020-06-12 DIAGNOSIS — Z1152 Encounter for screening for COVID-19: Secondary | ICD-10-CM | POA: Diagnosis not present

## 2020-06-26 ENCOUNTER — Encounter: Payer: Self-pay | Admitting: Family Medicine

## 2020-06-27 ENCOUNTER — Telehealth (INDEPENDENT_AMBULATORY_CARE_PROVIDER_SITE_OTHER): Payer: Medicare Other | Admitting: Nurse Practitioner

## 2020-06-27 ENCOUNTER — Encounter: Payer: Self-pay | Admitting: Nurse Practitioner

## 2020-06-27 VITALS — BP 160/91 | Ht 70.0 in

## 2020-06-27 DIAGNOSIS — U071 COVID-19: Secondary | ICD-10-CM | POA: Diagnosis not present

## 2020-06-27 DIAGNOSIS — J0140 Acute pansinusitis, unspecified: Secondary | ICD-10-CM

## 2020-06-27 MED ORDER — NIRMATRELVIR/RITONAVIR (PAXLOVID)TABLET: 3.0000 | ORAL_TABLET | Freq: Two times a day (BID) | ORAL | 0 refills | Status: AC

## 2020-06-27 NOTE — Progress Notes (Signed)
Virtual Visit via Video Note  I connected withNAME@ on 06/27/20 at  9:30 AM EDT by a video enabled telemedicine application and verified that I am speaking with the correct person using two identifiers.  Location: Patient:Home Provider: Office Participants: patient and provider  I discussed the limitations of evaluation and management by telemedicine and the availability of in person appointments. I also discussed with the patient that there may be a patient responsible charge related to this service. The patient expressed understanding and agreed to proceed.  YI:[id number] for covid yesterday, cough, headache, stuffy nose sinus pressure symptoms started yesterday. Patient not sure if he could start on medications for covid.  History of Present Illness: Labs completed By [facility] rheumatology 05/23/2020: eGFR of 81m/min, Creat. 0.96, BUN 14, WBC 4.9, Neutrophil abs 2.4, lymph abs 1.9 URI  This is a new problem. The current episode started yesterday. The problem has been unchanged. There has been no fever. Associated symptoms include congestion, headaches, rhinorrhea, sinus pain and sneezing. Pertinent negatives include no chest pain, coughing, diarrhea, dysuria, ear pain, joint pain, joint swelling, nausea, neck pain, plugged ear sensation, rash, sore throat, swollen glands, vomiting or wheezing. He has tried decongestant and acetaminophen for the symptoms. The treatment provided mild relief.   Observations/Objective: Physical Exam Vitals reviewed.  Constitutional:      General: He is not in acute distress. Pulmonary:     Effort: Pulmonary effort is normal.  Neurological:     Mental Status: He is alert and oriented to person, place, and time.    Assessment and Plan: KWiattwas seen today for covid positive.  Diagnoses and all orders for this visit:  COVID-19 -     nirmatrelvir/ritonavir EUA (PAXLOVID) TABS; Take 3 tablets by mouth 2 (two) times daily for 5 days. (Take nirmatrelvir 150  mg two tablets twice daily for 5 days and ritonavir 100 mg one tablet twice daily for 5 days) Patient GFR is 85  Acute non-recurrent pansinusitis -     nirmatrelvir/ritonavir EUA (PAXLOVID) TABS; Take 3 tablets by mouth 2 (two) times daily for 5 days. (Take nirmatrelvir 150 mg two tablets twice daily for 5 days and ritonavir 100 mg one tablet twice daily for 5 days) Patient GFR is 85   Follow Up Instructions: Elevated BP due to use of acute illness and use of decongestant. Continue to monitor BP once a day If headache is worse and BP stays elevated (>140/80), go to nearest urgent care clinic Stop all oral decongestants Use mucinex, saline nasal spray and antihistamine for sinus congestion. Maintain adequate oral hydration   I discussed the assessment and treatment plan with the patient. The patient was provided an opportunity to ask questions and all were answered. The patient agreed with the plan and demonstrated an understanding of the instructions.   The patient was advised to call back or seek an in-person evaluation if the symptoms worsen or if the condition fails to improve as anticipated.  CWilfred Lacy NP

## 2020-06-27 NOTE — Patient Instructions (Signed)
Elevated BP due to use of acute illness and use of decongestant. Continue to monitor BP once a day If headache is worse and BP stays elevated (>140/80), go to nearest urgent care clinic Stop all oral decongestants Use mucinex, saline nasal spray and antihistamine for sinus congestion. Maintain adequate oral hydration  COVID-19: What to Do if You Are Sick CDC has updated isolation and quarantine recommendations for the public, and is revising the CDC website to reflect these changes. These recommendations do not apply to healthcare personnel and do not supersede state, local, tribal, or territorial laws, rules, andregulations. If you have a fever, cough or other symptoms, you might have COVID-19. Most people have mild illness and are able to recover at home. If you are sick: Keep track of your symptoms. If you have an emergency warning sign (including trouble breathing), call 911. Steps to help prevent the spread of COVID-19 if you are sick If you are sick with COVID-19 or think you might have COVID-19, follow the steps below to care for yourself and to help protect other peoplein your home and community. Stay home except to get medical care Stay home. Most people with COVID-19 have mild illness and can recover at home without medical care. Do not leave your home, except to get medical care. Do not visit public areas. Take care of yourself. Get rest and stay hydrated. Take over-the-counter medicines, such as acetaminophen, to help you feel better. Stay in touch with your doctor. Call before you get medical care. Be sure to get care if you have trouble breathing, or have any other emergency warning signs, or if you think it is an emergency. Avoid public transportation, ride-sharing, or taxis. Separate yourself from other people As much as possible, stay in a specific room and away from other people and pets in your home. If possible, you should use a separate bathroom. If you need to be around  other people or animals in oroutside of the home, wear a mask. Tell your close contactsthat they may have been exposed to COVID-19. An infected person can spread COVID-19 starting 48 hours (or 2 days) before the person has any symptoms or tests positive. By letting your close contacts know they may have been exposed to COVID-19, you are helping to protect everyone. Additional guidance is available for those living in close quarters and shared housing. See COVID-19 and Animals if you have questions about pets. If you are diagnosed with COVID-19, someone from the health department may call you. Answer the call to slow the spread. Monitor your symptoms Symptoms of COVID-19 include fever, cough, or other symptoms. Follow care instructions from your healthcare provider and local health department. Your local health authorities may give instructions on checking your symptoms and reporting information. When to seek emergency medical attention Look for emergency warning signs* for COVID-19. If someone is showing any of these signs, seek emergency medical care immediately: Trouble breathing Persistent pain or pressure in the chest New confusion Inability to wake or stay awake Pale, gray, or blue-colored skin, lips, or nail beds, depending on skin tone *This list is not all possible symptoms. Please call your medical provider forany other symptoms that are severe or concerning to you. Call 911 or call ahead to your local emergency facility: Notify the operator that you are seeking care for someone who has or may haveCOVID-19. Call ahead before visiting your doctor Call ahead. Many medical visits for routine care are being postponed or done by phone or telemedicine.  If you have a medical appointment that cannot be postponed, call your doctor's office, and tell them you have or may have COVID-19. This will help the office protect themselves and other patients. Get tested If you have symptoms of COVID-19,  get tested. While waiting for test results, you stay away from others, including staying apart from those living in your household. Self-tests are one of several options for testing for the virus that causes COVID-19 and may be more convenient than laboratory-based tests and point-of-care tests. Ask your healthcare provider or your local health department if you need help interpreting your test results. You can visit your state, tribal, local, and territorial health department's website to look for the latest local information on testing sites. If you are sick, wear a mask over your nose and mouth You should wear a mask over your nose and mouth if you must be around other people or animals, including pets (even at home). You don't need to wear the mask if you are alone. If you can't put on a mask (because of trouble breathing, for example), cover your coughs and sneezes in some other way. Try to stay at least 6 feet away from other people. This will help protect the people around you. Masks should not be placed on young children under age 2 years, anyone who has trouble breathing, or anyone who is not able to remove the mask without help. Note: During the COVID-19 pandemic, medical grade facemasks are reserved forhealthcare workers and some first responders. Cover your coughs and sneezes Cover your mouth and nose with a tissue when you cough or sneeze. Throw away used tissues in a lined trash can. Immediately wash your hands with soap and water for at least 20 seconds. If soap and water are not available, clean your hands with an alcohol-based hand sanitizer that contains at least 60% alcohol. Clean your hands often Wash your hands often with soap and water for at least 20 seconds. This is especially important after blowing your nose, coughing, or sneezing; going to the bathroom; and before eating or preparing food. Use hand sanitizer if soap and water are not available. Use an alcohol-based hand  sanitizer with at least 60% alcohol, covering all surfaces of your hands and rubbing them together until they feel dry. Soap and water are the best option, especially if hands are visibly dirty. Avoid touching your eyes, nose, and mouth with unwashed hands. Handwashing Tips Avoid sharing personal household items Do not share dishes, drinking glasses, cups, eating utensils, towels, or bedding with other people in your home. Wash these items thoroughly after using them with soap and water or put in the dishwasher. Clean all "high-touch" surfaces every day Clean and disinfect high-touch surfaces in your "sick room" and bathroom; wear disposable gloves. Let someone else clean and disinfect surfaces in common areas, but you should clean your bedroom and bathroom, if possible. If a caregiver or other person needs to clean and disinfect a sick person's bedroom or bathroom, they should do so on an as-needed basis. The caregiver/other person should wear a mask and disposable gloves prior to cleaning. They should wait as long as possible after the person who is sick has used the bathroom before coming in to clean and use the bathroom. High-touch surfaces include phones, remote controls, counters, tabletops, doorknobs, bathroom fixtures, toilets, keyboards, tablets, and bedside tables. Clean and disinfect areas that may have blood, stool, or body fluids on them. Use household cleaners and disinfectants. Clean the  area or item with soap and water or another detergent if it is dirty. Then, use a household disinfectant. Be sure to follow the instructions on the label to ensure safe and effective use of the product. Many products recommend keeping the surface wet for several minutes to ensure germs are killed. Many also recommend precautions such as wearing gloves and making sure you have good ventilation during use of the product. Use a product from [employer]: Disinfectants for Coronavirus (TDDUK-02). Complete  Disinfection Guidance When you can be around others after being sick with COVID-19 Deciding when you can be around others is different for different situations. Find out when you can safely end home isolation. For any additional questions about your care,contact your healthcare provider or state or local health department. 12/26/2019 Content source: [facility] ([facility]), Division of Viral Diseases This information is not intended to replace advice given to you by your health care provider. Make sure you discuss any questions you have with your healthcare provider. Document Revised: 02/22/2020 Document Reviewed: 02/22/2020 Elsevier Patient Education  [place].

## 2020-06-28 ENCOUNTER — Encounter: Payer: Self-pay | Admitting: Nurse Practitioner

## 2020-07-04 ENCOUNTER — Other Ambulatory Visit: Payer: Self-pay | Admitting: Family Medicine

## 2020-07-04 DIAGNOSIS — N401 Enlarged prostate with lower urinary tract symptoms: Secondary | ICD-10-CM

## 2020-07-04 DIAGNOSIS — R3911 Hesitancy of micturition: Secondary | ICD-10-CM

## 2020-07-28 DIAGNOSIS — L4050 Arthropathic psoriasis, unspecified: Secondary | ICD-10-CM | POA: Diagnosis not present

## 2020-08-12 ENCOUNTER — Ambulatory Visit (INDEPENDENT_AMBULATORY_CARE_PROVIDER_SITE_OTHER): Payer: Medicare Other | Admitting: *Deleted

## 2020-08-12 DIAGNOSIS — Z0000 Encounter for general adult medical examination without abnormal findings: Secondary | ICD-10-CM

## 2020-08-12 NOTE — Patient Instructions (Signed)
Anthony Greene , Thank you for taking time to come for your Medicare Wellness Visit. I appreciate your ongoing commitment to your health goals. Please review the following plan we discussed and let me know if I can assist you in the future.   Screening recommendations/referrals: Colonoscopy: up to date Recommended yearly ophthalmology/optometry visit for glaucoma screening and checkup Recommended yearly dental visit for hygiene and checkup  Vaccinations: Influenza vaccine: up to date Pneumococcal vaccine: up to date Tdap vaccine: up to date Shingles vaccine: Education provided    Advanced directives: on file  Conditions/risks identified: na    Preventive Care 101 Years and Older, Male Preventive care refers to lifestyle choices and visits with your health care provider that can promote health and wellness. What does preventive care include? A yearly physical exam. This is also called an annual well check. Dental exams once or twice a year. Routine eye exams. Ask your health care provider how often you should have your eyes checked. Personal lifestyle choices, including: Daily care of your teeth and gums. Regular physical activity. Eating a healthy diet. Avoiding tobacco and drug use. Limiting alcohol use. Practicing safe sex. Taking low doses of aspirin every day. Taking vitamin and mineral supplements as recommended by your health care provider. What happens during an annual well check? The services and screenings done by your health care provider during your annual well check will depend on your age, overall health, lifestyle risk factors, and family history of disease. Counseling  Your health care provider may ask you questions about your: Alcohol use. Tobacco use. Drug use. Emotional well-being. Home and relationship well-being. Sexual activity. Eating habits. History of falls. Memory and ability to understand (cognition). Work and work Statistician. Screening  You  may have the following tests or measurements: Height, weight, and BMI. Blood pressure. Lipid and cholesterol levels. These may be checked every 5 years, or more frequently if you are over 71 years old. Skin check. Lung cancer screening. You may have this screening every year starting at age 71 if you have a 30-pack-year history of smoking and currently smoke or have quit within the past 15 years. Fecal occult blood test (FOBT) of the stool. You may have this test every year starting at age 71. Flexible sigmoidoscopy or colonoscopy. You may have a sigmoidoscopy every 5 years or a colonoscopy every 10 years starting at age 71. Prostate cancer screening. Recommendations will vary depending on your family history and other risks. Hepatitis C blood test. Hepatitis B blood test. Sexually transmitted disease (STD) testing. Diabetes screening. This is done by checking your blood sugar (glucose) after you have not eaten for a while (fasting). You may have this done every 1-3 years. Abdominal aortic aneurysm (AAA) screening. You may need this if you are a current or former smoker. Osteoporosis. You may be screened starting at age 71 if you are at high risk. Talk with your health care provider about your test results, treatment options, and if necessary, the need for more tests. Vaccines  Your health care provider may recommend certain vaccines, such as: Influenza vaccine. This is recommended every year. Tetanus, diphtheria, and acellular pertussis (Tdap, Td) vaccine. You may need a Td booster every 10 years. Zoster vaccine. You may need this after age 71. Pneumococcal 13-valent conjugate (PCV13) vaccine. One dose is recommended after age 71. Pneumococcal polysaccharide (PPSV23) vaccine. One dose is recommended after age 71. Talk to your health care provider about which screenings and vaccines you need and how often  you need them. This information is not intended to replace advice given to you by your  health care provider. Make sure you discuss any questions you have with your health care provider. Document Released: 01/31/2015 Document Revised: 09/24/2015 Document Reviewed: 11/05/2014 Elsevier Interactive Patient Education  2017 [place] Prevention in the Home Falls can cause injuries. They can happen to people of all ages. There are many things you can do to make your home safe and to help prevent falls. What can I do on the outside of my home? Regularly fix the edges of walkways and driveways and fix any cracks. Remove anything that might make you trip as you walk through a door, such as a raised step or threshold. Trim any bushes or trees on the path to your home. Use bright outdoor lighting. Clear any walking paths of anything that might make someone trip, such as rocks or tools. Regularly check to see if handrails are loose or broken. Make sure that both sides of any steps have handrails. Any raised decks and porches should have guardrails on the edges. Have any leaves, snow, or ice cleared regularly. Use sand or salt on walking paths during winter. Clean up any spills in your garage right away. This includes oil or grease spills. What can I do in the bathroom? Use night lights. Install grab bars by the toilet and in the tub and shower. Do not use towel bars as grab bars. Use non-skid mats or decals in the tub or shower. If you need to sit down in the shower, use a plastic, non-slip stool. Keep the floor dry. Clean up any water that spills on the floor as soon as it happens. Remove soap buildup in the tub or shower regularly. Attach bath mats securely with double-sided non-slip rug tape. Do not have throw rugs and other things on the floor that can make you trip. What can I do in the bedroom? Use night lights. Make sure that you have a light by your bed that is easy to reach. Do not use any sheets or blankets that are too big for your bed. They should not hang down  onto the floor. Have a firm chair that has side arms. You can use this for support while you get dressed. Do not have throw rugs and other things on the floor that can make you trip. What can I do in the kitchen? Clean up any spills right away. Avoid walking on wet floors. Keep items that you use a lot in easy-to-reach places. If you need to reach something above you, use a strong step stool that has a grab bar. Keep electrical cords out of the way. Do not use floor polish or wax that makes floors slippery. If you must use wax, use non-skid floor wax. Do not have throw rugs and other things on the floor that can make you trip. What can I do with my stairs? Do not leave any items on the stairs. Make sure that there are handrails on both sides of the stairs and use them. Fix handrails that are broken or loose. Make sure that handrails are as long as the stairways. Check any carpeting to make sure that it is firmly attached to the stairs. Fix any carpet that is loose or worn. Avoid having throw rugs at the top or bottom of the stairs. If you do have throw rugs, attach them to the floor with carpet tape. Make sure that you have a light  switch at the top of the stairs and the bottom of the stairs. If you do not have them, ask someone to add them for you. What else can I do to help prevent falls? Wear shoes that: Do not have high heels. Have rubber bottoms. Are comfortable and fit you well. Are closed at the toe. Do not wear sandals. If you use a stepladder: Make sure that it is fully opened. Do not climb a closed stepladder. Make sure that both sides of the stepladder are locked into place. Ask someone to hold it for you, if possible. Clearly mark and make sure that you can see: Any grab bars or handrails. First and last steps. Where the edge of each step is. Use tools that help you move around (mobility aids) if they are needed. These include: Canes. Walkers. Scooters. Crutches. Turn  on the lights when you go into a dark area. Replace any light bulbs as soon as they burn out. Set up your furniture so you have a clear path. Avoid moving your furniture around. If any of your floors are uneven, fix them. If there are any pets around you, be aware of where they are. Review your medicines with your doctor. Some medicines can make you feel dizzy. This can increase your chance of falling. Ask your doctor what other things that you can do to help prevent falls. This information is not intended to replace advice given to you by your health care provider. Make sure you discuss any questions you have with your health care provider. Document Released: 10/31/2008 Document Revised: 06/12/2015 Document Reviewed: 02/08/2014 Elsevier Interactive Patient Education  2017 [employer].

## 2020-08-12 NOTE — Progress Notes (Signed)
Subjective:   [name] is a 70 y.o. male who presents for Medicare Annual/Subsequent preventive examination.  I connected with  [name] on 08/12/20 by a telephone enabled telemedicine application and verified that I am speaking with the correct person using two identifiers.   I discussed the limitations of evaluation and management by telemedicine. The patient expressed understanding and agreed to proceed.   Review of Systems    NA Cardiac Risk Factors include: advanced age (>55mn, >>67women);male gender     Objective:    Today's Vitals   08/12/20 0951  PainSc: 4    There is no height or weight on file to calculate BMI.  Advanced Directives 08/12/2020 04/18/2019 01/09/2018 12/21/2017 06/23/2017 06/23/2017 06/19/2017  Does Patient Have a Medical Advance Directive? Yes Yes Yes Yes Yes No No  Type of AParamedicof AEvergreen ColonyLiving will HCamp SwiftLiving will HKlagetohLiving will HYubaLiving will HIdavilleLiving will - -  Does patient want to make changes to medical advance directive? - No - Patient declined - - No - Patient declined - -  Copy of H[place]in Chart? Yes - validated most recent copy scanned in chart (See row information) No - copy requested No - copy requested No - copy requested No - copy requested - -  Would patient like information on creating a medical advance directive? - - - - No - Patient declined No - Patient declined No - Patient declined    Current Medications (verified) Outpatient Encounter Medications as of 08/12/2020  Medication Sig   celecoxib (CELEBREX) 200 MG capsule Take 200 mg by mouth at bedtime.    Golimumab ([place] IV) Inject into the vein.   Melatonin 3 MG CAPS Take 3 mg by mouth at bedtime.    tamsulosin (FLOMAX) 0.4 MG CAPS capsule Take 1 capsule (0.4 mg total) by mouth daily. **Need appt before next refill**    No facility-administered encounter medications on file as of 08/12/2020.    Allergies (verified) Dilaudid [hydromorphone hcl], Morphine and related, Vancomycin, and Penicillins   History: Past Medical History:  Diagnosis Date   Arthritis    [place] of [place] vertebra ([place] 06/06/2017   Hematoma of hip, right, initial encounter 06/23/2017   Injury resulting from fall from height 06/07/2017   Intractable low back pain 06/07/2017   Neuropathy    Osteoarthritis    Pilonidal abscess 06/22/2017   Pilonidal cyst 06/22/2017   Psoriasis    [place]oriatic arthritis [place]    Sebaceous cyst    Past Surgical History:  Procedure Laterality Date   CARPAL TUNNEL RELEASE Right    COLONOSCOPY WITH PROPOFOL     DRESSING CHANGE UNDER ANESTHESIA N/A 06/25/2017   Procedure: DRESSING CHANGE UNDER ANESTHESIA;  Surgeon: CFlorene Glen MD;  Location: [facility] ORS;  Service: General;  Laterality: N/A;   IRRIGATION AND DEBRIDEMENT BUTTOCKS Right 06/23/2017   Procedure: IRRIGATION AND DEBRIDEMENT BUTTOCKS;  Surgeon: DVickie Epley MD;  Location: [facility] ORS;  Service: General;  Laterality: Right;   JOINT REPLACEMENT     BL knee   KNEE ARTHROSCOPY Right    PILONIDAL CYST EXCISION N/A 01/20/2018   Procedure: EXCISION OF INCREASINGLY SYMPTOMATIC PILONIDAL CYST AND RIGHT OF UPPER BACK SEBACEOUS CYST;  Surgeon: DVickie Epley MD;  Location: [facility] ORS;  Service: General;  Laterality: N/A;   TONSILLECTOMY     Family History  Problem Relation  Age of Onset   Asthma Mother    COPD Mother    Stroke Father    Heart disease Father    Cancer Brother    Social History   Socioeconomic History   Marital status: Married    Spouse name: Not on file   Number of children: Not on file   Years of education: Not on file   Highest education level: Not on file  Occupational History   Not on file  Tobacco Use   Smoking status: Former    Types: Cigars   Smokeless tobacco: Never  Substance and  Sexual Activity   Alcohol use: Yes    Comment: 1-3 alcoholic drinks daily   Drug use: Never   Sexual activity: Yes  Other Topics Concern   Not on file  Social History Narrative   Not on file   Social Determinants of Health   Financial Resource Strain: Low Risk    Difficulty of Paying Living Expenses: Not hard at all  Food Insecurity: No Food Insecurity   Worried About Charity fundraiser in the Last Year: Never true   [place] in the Last Year: Never true  Transportation Needs: No Transportation Needs   Lack of Transportation (Medical): No   Lack of Transportation (Non-Medical): No  Physical Activity: Sufficiently Active   Days of Exercise per Week: 5 days   Minutes of Exercise per Session: 30 min  Stress: No Stress Concern Present   Feeling of Stress : Not at all  Social Connections: Moderately Isolated   Frequency of Communication with Friends and Family: More than three times a week   Frequency of Social Gatherings with Friends and Family: More than three times a week   Attends Religious Services: Never   Marine scientist or Organizations: No   Attends Music therapist: Never   Marital Status: Married    Tobacco Counseling Counseling given: Not Answered   Clinical Intake:  Pre-visit preparation completed: Yes  Pain : 0-10 Pain Score: 4  Pain Type: Chronic pain Pain Location: Leg Pain Orientation: Left, Right Pain Descriptors / Indicators: Aching Pain Relieving Factors: CELBREX, INFUSION  Pain Relieving Factors: CELBREX, INFUSION  Nutritional Risks: None Diabetes: No  How often do you need to have someone help you when you read instructions, pamphlets, or other written materials from your doctor or pharmacy?: 1 - Never  Diabetic?  NO  Interpreter Needed?: No  Information entered by :: Leroy Kennedy LPN   Activities of Daily Living In your present state of health, do you have any difficulty performing the following activities:  08/12/2020  Hearing? N  Vision? N  Difficulty concentrating or making decisions? N  Walking or climbing stairs? N  Dressing or bathing? N  Doing errands, shopping? N  Preparing Food and eating ? N  Using the Toilet? N  In the past six months, have you accidently leaked urine? N  Do you have problems with loss of bowel control? N  Managing your Medications? N  Managing your Finances? N  Housekeeping or managing your Housekeeping? N  Some recent data might be hidden    Patient Care Team: Libby Maw, MD as PCP - General (Family Medicine)  Indicate any recent Medical Services you may have received from other than [facility] providers in the past year (date may be approximate).     Assessment:   This is a routine wellness examination for Anthony Greene.  Hearing/Vision screen Hearing Screening -  Comments:: No trouble hearing  Dietary issues and exercise activities discussed: Current Exercise Habits: Home exercise routine, Time (Minutes): 30, Frequency (Times/Week): 5, Weekly Exercise (Minutes/Week): 150, Intensity: Moderate   Goals Addressed             This Visit's Progress    Maintain current health   On track    Continue to exercise and eat healthy      Patient Stated       Continue current health lifestyle       Depression Screen PHQ 2/9 Scores 08/12/2020 06/27/2020 01/14/2020 04/18/2019 02/26/2019 02/26/2019 12/21/2017  PHQ - 2 Score 0 0 0 0 0 0 0  PHQ- 9 Score - - - - 3 - -    Fall Risk Fall Risk  08/12/2020 06/27/2020 01/14/2020 04/18/2019 02/26/2019  Falls in the past year? 0 0 0 0 0  Number falls in past yr: 0 - - 0 -  Injury with Fall? 0 - - 0 -  Follow up Falls evaluation completed;Falls prevention discussed - - Education provided;Falls prevention discussed -    FALL RISK PREVENTION PERTAINING TO THE HOME:  Any stairs in or around the home? Yes  If so, are there any without handrails? Yes  Home free of loose throw rugs in walkways, pet beds, electrical cords, etc?  No  Adequate lighting in your home to reduce risk of falls? No   ASSISTIVE DEVICES UTILIZED TO PREVENT FALLS:  Life alert? No  Use of a cane, walker or w/c? No  Grab bars in the bathroom? No  Shower chair or bench in shower? No  Elevated toilet seat or a handicapped toilet? No   TIMED UP AND GO:  Was the test performed? No .    Cognitive Function:  Normal cognitive status assessed by direct observation by this Nurse Health Advisor. No abnormalities found.          Immunizations Immunization History  Administered Date(s) Administered   Fluad Quad(high Dose 65+) 09/12/2018   Influenza Inj Mdck Quad Pf 11/01/2017   Influenza Split 11/05/2014   Influenza,inj,quad, With Preservative 09/09/2015   Influenza-Unspecified 09/18/2016, 11/02/2019   PFIZER(Purple Top)SARS-COV-2 Vaccination 02/24/2019, 03/21/2019, 09/03/2019, 05/08/2020   Pneumococcal Polysaccharide-23 11/05/2014   Tdap 02/26/2019   Zoster Recombinat (Shingrix) 11/28/2019    TDAP status: Up to date  Flu Vaccine status: Due, Education has been provided regarding the importance of this vaccine. Advised may receive this vaccine at local pharmacy or Health Dept. Aware to provide a copy of the vaccination record if obtained from local pharmacy or Health Dept. Verbalized acceptance and understanding.  Pneumococcal vaccine status: Due, Education has been provided regarding the importance of this vaccine. Advised may receive this vaccine at local pharmacy or Health Dept. Aware to provide a copy of the vaccination record if obtained from local pharmacy or Health Dept. Verbalized acceptance and understanding.  Covid-19 vaccine status: Completed vaccines  Qualifies for Shingles Vaccine? Yes   Zostavax completed No   Shingrix Completed?: No.    Education has been provided regarding the importance of this vaccine. Patient has been advised to call insurance company to determine out of pocket expense if they have not yet received  this vaccine. Advised may also receive vaccine at local pharmacy or Health Dept. Verbalized acceptance and understanding.  Screening Tests Health Maintenance  Topic Date Due   Hepatitis C Screening  Never done   Zoster Vaccines- Shingrix (2 of 2) 01/23/2020   INFLUENZA VACCINE  08/18/2020   COVID-19  Vaccine (5 - Booster for Pfizer series) 09/07/2020   COLONOSCOPY (Pts 45-72yr Insurance coverage will need to be confirmed)  04/17/2026   TETANUS/TDAP  02/25/2029   PNA vac Low Risk Adult  Completed   HPV VACCINES  Aged Out    Health Maintenance  Health Maintenance Due  Topic Date Due   Hepatitis C Screening  Never done   Zoster Vaccines- Shingrix (2 of 2) 01/23/2020    Colorectal cancer screening: Type of screening: Colonoscopy. Completed  . Repeat every 10 years  Lung Cancer Screening: (Low Dose CT Chest recommended if Age 71-80years, 30 pack-year currently smoking OR have quit w/in 15years.) does not qualify.   Lung Cancer Screening Referral:   Additional Screening:  Hepatitis C Screening: does qualify; NEVER DONE  Vision Screening: Recommended annual ophthalmology exams for early detection of glaucoma and other disorders of the eye. Is the patient up to date with their annual eye exam?  Yes  Who is the provider or what is the name of the office in which the patient attends annual eye exams? My eye doctor If pt is not established with a provider, would they like to be referred to a provider to establish care? No .   Dental Screening: Recommended annual dental exams for proper oral hygiene  Community Resource Referral / Chronic Care Management: CRR required this visit?  No   CCM required this visit?  No      Plan:     I have personally reviewed and noted the following in the patient's chart:   Medical and social history Use of alcohol, tobacco or illicit drugs  Current medications and supplements including opioid prescriptions. Patient is not currently taking  opioid prescriptions. Functional ability and status Nutritional status Physical activity Advanced directives List of other physicians Hospitalizations, surgeries, and ER visits in previous 12 months Vitals Screenings to include cognitive, depression, and falls Referrals and appointments  In addition, I have reviewed and discussed with patient certain preventive protocols, quality metrics, and best practice recommendations. A written personalized care plan for preventive services as well as general preventive health recommendations were provided to patient.     JLeroy Kennedy LPN   [id number]  Nurse Notes: NA

## 2020-09-03 ENCOUNTER — Encounter: Payer: Self-pay | Admitting: Family Medicine

## 2020-09-03 ENCOUNTER — Ambulatory Visit (INDEPENDENT_AMBULATORY_CARE_PROVIDER_SITE_OTHER): Payer: Medicare Other | Admitting: Family Medicine

## 2020-09-03 ENCOUNTER — Ambulatory Visit (INDEPENDENT_AMBULATORY_CARE_PROVIDER_SITE_OTHER): Payer: Medicare Other

## 2020-09-03 ENCOUNTER — Other Ambulatory Visit: Payer: Self-pay

## 2020-09-03 VITALS — BP 120/76 | HR 68 | Temp 97.4°F | Ht 70.0 in | Wt 223.2 lb

## 2020-09-03 DIAGNOSIS — N401 Enlarged prostate with lower urinary tract symptoms: Secondary | ICD-10-CM

## 2020-09-03 DIAGNOSIS — R3911 Hesitancy of micturition: Secondary | ICD-10-CM | POA: Diagnosis not present

## 2020-09-03 DIAGNOSIS — M7062 Trochanteric bursitis, left hip: Secondary | ICD-10-CM

## 2020-09-03 DIAGNOSIS — R0683 Snoring: Secondary | ICD-10-CM

## 2020-09-03 DIAGNOSIS — R972 Elevated prostate specific antigen [PSA]: Secondary | ICD-10-CM

## 2020-09-03 DIAGNOSIS — Z0000 Encounter for general adult medical examination without abnormal findings: Secondary | ICD-10-CM

## 2020-09-03 DIAGNOSIS — N429 Disorder of prostate, unspecified: Secondary | ICD-10-CM | POA: Diagnosis not present

## 2020-09-03 DIAGNOSIS — Z789 Other specified health status: Secondary | ICD-10-CM | POA: Diagnosis not present

## 2020-09-03 DIAGNOSIS — R937 Abnormal findings on diagnostic imaging of other parts of musculoskeletal system: Secondary | ICD-10-CM | POA: Diagnosis not present

## 2020-09-03 DIAGNOSIS — D8989 Other specified disorders involving the immune mechanism, not elsewhere classified: Secondary | ICD-10-CM

## 2020-09-03 DIAGNOSIS — M25552 Pain in left hip: Secondary | ICD-10-CM

## 2020-09-03 DIAGNOSIS — R202 Paresthesia of skin: Secondary | ICD-10-CM | POA: Diagnosis not present

## 2020-09-03 DIAGNOSIS — M4807 Spinal stenosis, lumbosacral region: Secondary | ICD-10-CM | POA: Diagnosis not present

## 2020-09-03 MED ORDER — TAMSULOSIN HCL 0.4 MG PO CAPS: 0.8000 mg | ORAL_CAPSULE | Freq: Every day | ORAL | 1 refills | Status: DC

## 2020-09-03 NOTE — Progress Notes (Addendum)
Established Patient Office Visit  Subjective:  Patient ID: Anthony Greene, male    DOB: 11/23/1950  Age: 70 y.o. MRN: [id number]  CC:  Chief Complaint  Patient presents with   Follow-up    Follow up on back pains, feet numb after walking more than 10 minutes. Discuss possible back surgery for spinous, neck issues causing left arm pains. Sleeping issues medications (flomax not helping) left hip pains possible referral to ortho.     HPI Anthony Greene presents for a physical exam and follow-up of multiple medical issues.  He is considering surgery for spinal stenosis.  He is dealing with lumbago and paresthesias in the soles of both of his feet after walking any distance at all.  He is still able to exercise on his elliptical at home.  Tamsulosin is not helping his urine flow.  He will ports prevoid dribbling, decrease in the force of his stream frequency urgency and nocturia up to 4 times.  He is not sleeping well.  He does snore at night.  Enjoys 2 to 3 glasses of wine daily.  Drinks copious fluids in the evening hours.  He is having left lateral hip pain.   Past Medical History:  Diagnosis Date   Arthritis    Closed fracture of transverse process of lumbar vertebra ([place]) 06/06/2017   Hematoma of hip, right, initial encounter 06/23/2017   Injury resulting from fall from height 06/07/2017   Intractable low back pain 06/07/2017   Neuropathy    Osteoarthritis    Pilonidal abscess 06/22/2017   Pilonidal cyst 06/22/2017   Psoriasis    Psoriatic arthritis ([place])    Sebaceous cyst     Past Surgical History:  Procedure Laterality Date   CARPAL TUNNEL RELEASE Right    COLONOSCOPY WITH PROPOFOL     DRESSING CHANGE UNDER ANESTHESIA N/A 06/25/2017   Procedure: DRESSING CHANGE UNDER ANESTHESIA;  Surgeon: Florene Glen, MD;  Location: [facility] ORS;  Service: General;  Laterality: N/A;   IRRIGATION AND DEBRIDEMENT BUTTOCKS Right 06/23/2017   Procedure: IRRIGATION AND DEBRIDEMENT BUTTOCKS;  Surgeon: Vickie Epley, MD;  Location: [facility] ORS;  Service: General;  Laterality: Right;   JOINT REPLACEMENT     BL knee   KNEE ARTHROSCOPY Right    PILONIDAL CYST EXCISION N/A 01/20/2018   Procedure: EXCISION OF INCREASINGLY SYMPTOMATIC PILONIDAL CYST AND RIGHT OF UPPER BACK SEBACEOUS CYST;  Surgeon: Vickie Epley, MD;  Location: [facility] ORS;  Service: General;  Laterality: N/A;   TONSILLECTOMY      Family History  Problem Relation Age of Onset   Asthma Mother    COPD Mother    Stroke Father    Heart disease Father    Cancer Brother     Social History   Socioeconomic History   Marital status: Married    Spouse name: Not on file   Number of children: Not on file   Years of education: Not on file   Highest education level: Not on file  Occupational History   Not on file  Tobacco Use   Smoking status: Former    Types: Cigars   Smokeless tobacco: Never  Vaping Use   Vaping Use: Never used  Substance and Sexual Activity   Alcohol use: Yes    Comment: 1-3 alcoholic drinks daily   Drug use: Never   Sexual activity: Yes  Other Topics Concern   Not on file  Social History Narrative   Not on file   Social Determinants  of Health   Financial Resource Strain: Low Risk    Difficulty of Paying Living Expenses: Not hard at all  Food Insecurity: No Food Insecurity   Worried About [place] in the Last Year: Never true   [place] in the Last Year: Never true  Transportation Needs: No Transportation Needs   Lack of Transportation (Medical): No   Lack of Transportation (Non-Medical): No  Physical Activity: Sufficiently Active   Days of Exercise per Week: 5 days   Minutes of Exercise per Session: 30 min  Stress: No Stress Concern Present   Feeling of Stress : Not at all  Social Connections: Moderately Isolated   Frequency of Communication with Friends and Family: More than three times a week   Frequency of Social Gatherings with Friends and Family: More than three times a  week   Attends Religious Services: Never   Marine scientist or Organizations: No   Attends Music therapist: Never   Marital Status: Married  Human resources officer Violence: Not At Risk   Fear of Current or Ex-Partner: No   Emotionally Abused: No   Physically Abused: No   Sexually Abused: No    Outpatient Medications Prior to Visit  Medication Sig Dispense Refill   celecoxib (CELEBREX) 200 MG capsule Take 200 mg by mouth at bedtime.      Famotidine (PEPCID PO) Take by mouth.     Golimumab ([place] ARIA IV) Inject into the vein.     tamsulosin (FLOMAX) 0.4 MG CAPS capsule Take 1 capsule (0.4 mg total) by mouth daily. **Need appt before next refill** 90 capsule 0   Melatonin 3 MG CAPS Take 3 mg by mouth at bedtime.      No facility-administered medications prior to visit.    Allergies  Allergen Reactions   Dilaudid [Hydromorphone Hcl] Nausea And Vomiting   Morphine And Related Nausea And Vomiting   Vancomycin Other (See Comments)    redman syndrome   Penicillins Rash    Patient has only had rash reaction to PCN in the past.  Patient and wife state he Tolerates keflex and  Ancef with no problem.  Has patient had a PCN reaction causing immediate rash, facial/tongue/throat swelling, SOB or lightheadedness with hypotension: Yes- rash only Has patient had a PCN reaction causing severe rash involving mucus membranes or skin necrosis: No Has patient had a PCN reaction that required hospitalization: No Has patient had a PCN reaction occurring within the last 10 years: No    ROS Review of Systems  Constitutional:  Negative for diaphoresis, fatigue, fever and unexpected weight change.  HENT: Negative.    Eyes:  Negative for photophobia and visual disturbance.  Respiratory:  Negative for shortness of breath and wheezing.   Cardiovascular: Negative.   Musculoskeletal:  Positive for back pain and gait problem.  Neurological:  Positive for numbness. Negative for speech  difficulty and weakness.  Psychiatric/Behavioral:  Positive for sleep disturbance. Negative for dysphoric mood. The patient is not nervous/anxious.      Depression screen [facility] 2/9 09/03/2020 08/12/2020 06/27/2020  Decreased Interest 0 0 0  Down, Depressed, Hopeless 0 0 0  PHQ - 2 Score 0 0 0  Altered sleeping - - -  Tired, decreased energy - - -  Change in appetite - - -  Feeling bad or failure about yourself  - - -  Trouble concentrating - - -  Moving slowly or fidgety/restless - - -  Suicidal thoughts - - -  PHQ-9 Score - - -  Difficult doing work/chores - - -     Objective:    Physical Exam Vitals and nursing note reviewed.  Constitutional:      General: He is not in acute distress.    Appearance: Normal appearance. He is not ill-appearing, toxic-appearing or diaphoretic.  HENT:     Head: Normocephalic and atraumatic.     Right Ear: Tympanic membrane, ear canal and external ear normal.     Left Ear: Tympanic membrane, ear canal and external ear normal.     Mouth/Throat:     Mouth: Mucous membranes are moist.     Pharynx: Oropharynx is clear. No oropharyngeal exudate or posterior oropharyngeal erythema.  Eyes:     General: No scleral icterus.       Right eye: No discharge.        Left eye: No discharge.     Extraocular Movements: Extraocular movements intact.     Conjunctiva/sclera: Conjunctivae normal.     Pupils: Pupils are equal, round, and reactive to light.  Neck:     Vascular: No carotid bruit.  Cardiovascular:     Rate and Rhythm: Normal rate and regular rhythm.  Pulmonary:     Effort: Pulmonary effort is normal.     Breath sounds: Normal breath sounds.  Abdominal:     General: Abdomen is flat. Bowel sounds are normal. There is no distension.     Tenderness: There is no abdominal tenderness. There is no guarding or rebound.     Hernia: No hernia is present.  Genitourinary:   Musculoskeletal:     Cervical back: No rigidity or tenderness.  Lymphadenopathy:      Cervical: No cervical adenopathy.  Skin:    General: Skin is warm and dry.  Neurological:     Mental Status: He is alert and oriented to person, place, and time.  Psychiatric:        Mood and Affect: Mood normal.        Behavior: Behavior normal.    BP 120/76 (BP Location: Right Arm, Patient Position: Sitting, Cuff Size: Normal)   Pulse 68   Temp (!) 97.4 F (36.3 C) (Temporal)   Ht '5\' 10"'$  (1.778 m)   Wt 223 lb 3.2 oz (101.2 kg)   SpO2 96%   BMI 32.03 kg/m  Wt Readings from Last 3 Encounters:  09/03/20 223 lb 3.2 oz (101.2 kg)  01/14/20 225 lb 9.6 oz (102.3 kg)  06/15/19 214 lb (97.1 kg)     Health Maintenance Due  Topic Date Due   Hepatitis C Screening  Never done   COVID-19 Vaccine (5 - Booster for Pfizer series) 09/07/2020   INFLUENZA VACCINE  08/18/2020    There are no preventive care reminders to display for this patient.  Lab Results  Component Value Date   TSH 1.87 02/26/2019   Lab Results  Component Value Date   WBC 3.8 (L) 09/04/2020   HGB 15.0 09/04/2020   HCT 44.5 09/04/2020   MCV 92.7 09/04/2020   PLT 205.0 09/04/2020   Lab Results  Component Value Date   NA 138 09/04/2020   K 4.2 09/04/2020   CO2 26 09/04/2020   GLUCOSE 93 09/04/2020   BUN 15 09/04/2020   CREATININE 0.92 09/04/2020   BILITOT 0.8 09/04/2020   ALKPHOS 39 09/04/2020   AST 16 09/04/2020   ALT 14 09/04/2020   PROT 6.6 09/04/2020   ALBUMIN 4.2 09/04/2020   CALCIUM 9.8 09/04/2020  ANIONGAP 9 06/23/2017   GFR 83.93 09/04/2020   Lab Results  Component Value Date   CHOL 195 09/04/2020   Lab Results  Component Value Date   HDL 86.10 09/04/2020   Lab Results  Component Value Date   LDLCALC 100 (H) 09/04/2020   Lab Results  Component Value Date   TRIG 45.0 09/04/2020   Lab Results  Component Value Date   CHOLHDL 2 09/04/2020   No results found for: HGBA1C    Assessment & Plan:   Problem List Items Addressed This Visit       Musculoskeletal and  Integument   Trochanteric bursitis of left hip   Relevant Orders   Ambulatory referral to Sports Medicine   DG Hip Unilat W OR W/O Pelvis 2-3 Views Left (Completed)     Genitourinary   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   Relevant Medications   tamsulosin (FLOMAX) 0.4 MG CAPS capsule   Other Relevant Orders   PSA (Completed)   Disorder of prostate, unspecified    Relevant Orders   CBC (Completed)     Other   Daily consumption of alcohol   Healthcare maintenance   Relevant Orders   CBC (Completed)   Comprehensive metabolic panel (Completed)   Lipid panel (Completed)   Urinalysis, Routine w reflex microscopic (Completed)   Elevated PSA   Relevant Orders   PSA (Completed)   Paresthesias   Spinal stenosis of lumbosacral region   Paresthesia of both feet   Snores   Relevant Orders   Ambulatory referral to Sleep Studies   Left hip pain   Relevant Orders   Ambulatory referral to Sports Medicine   DG Hip Unilat W OR W/O Pelvis 2-3 Views Left (Completed)   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 **Need appt before next refill**    Dispense:  180 capsule    Refill:  1     Follow-up: Return in about 6 months (around 03/06/2021).  Given information on health maintenance and disease prevention as well as BPH.  Will increase tamsulosin to 0.8 mg daily.  Asked him to fluid restrict before bedtime.  Checking x-ray of left hip.  Believed that this is a hip bursitis.  We will review for sports medicine for second opinion.  Patient reluctantly agrees to go for sleep study.   Believe that snoring could be affecting his sleep as well as nocturia up to 4 times nightly.  Rechecking elevated PSA.  We will be following up with neurosurgery for spinal stenosis believed to be associated with paresthesias in both feet.  Advised him to go back and have his second Shingrix vaccine.  Libby Maw, MD

## 2020-09-04 ENCOUNTER — Other Ambulatory Visit: Payer: Self-pay

## 2020-09-04 ENCOUNTER — Encounter: Payer: Self-pay | Admitting: Family Medicine

## 2020-09-04 ENCOUNTER — Other Ambulatory Visit (INDEPENDENT_AMBULATORY_CARE_PROVIDER_SITE_OTHER): Payer: Medicare Other

## 2020-09-04 ENCOUNTER — Telehealth: Payer: Self-pay | Admitting: Family Medicine

## 2020-09-04 DIAGNOSIS — R937 Abnormal findings on diagnostic imaging of other parts of musculoskeletal system: Secondary | ICD-10-CM

## 2020-09-04 DIAGNOSIS — N429 Disorder of prostate, unspecified: Secondary | ICD-10-CM

## 2020-09-04 DIAGNOSIS — N401 Enlarged prostate with lower urinary tract symptoms: Secondary | ICD-10-CM | POA: Diagnosis not present

## 2020-09-04 DIAGNOSIS — R972 Elevated prostate specific antigen [PSA]: Secondary | ICD-10-CM

## 2020-09-04 DIAGNOSIS — Z0000 Encounter for general adult medical examination without abnormal findings: Secondary | ICD-10-CM

## 2020-09-04 DIAGNOSIS — R3911 Hesitancy of micturition: Secondary | ICD-10-CM | POA: Diagnosis not present

## 2020-09-04 DIAGNOSIS — F419 Anxiety disorder, unspecified: Secondary | ICD-10-CM

## 2020-09-04 LAB — URINALYSIS, ROUTINE W REFLEX MICROSCOPIC
Bilirubin Urine: NEGATIVE
Hgb urine dipstick: NEGATIVE
Ketones, ur: NEGATIVE
Leukocytes,Ua: NEGATIVE
Nitrite: NEGATIVE
RBC / HPF: NONE SEEN (ref 0–?)
Specific Gravity, Urine: <=1.005 — AB (ref 1.000–1.030)
Total Protein, Urine: NEGATIVE
Urine Glucose: NEGATIVE
Urobilinogen, UA: 0.2 (ref 0.0–1.0)
pH: 6 (ref 5.0–8.0)

## 2020-09-04 LAB — COMPREHENSIVE METABOLIC PANEL
ALT: 14 U/L (ref 0–53)
AST: 16 U/L (ref 0–37)
Albumin: 4.2 g/dL (ref 3.5–5.2)
Alkaline Phosphatase: 39 U/L (ref 39–117)
BUN: 15 mg/dL (ref 6–23)
CO2: 26 mEq/L (ref 19–32)
Calcium: 9.8 mg/dL (ref 8.4–10.5)
Chloride: 104 mEq/L (ref 96–112)
Creatinine, Ser: 0.92 mg/dL (ref 0.40–1.50)
GFR: 83.93 mL/min (ref >60.00)
Glucose, Bld: 93 mg/dL (ref 70–99)
Potassium: 4.2 mEq/L (ref 3.5–5.1)
Sodium: 138 mEq/L (ref 135–145)
Total Bilirubin: 0.8 mg/dL (ref 0.2–1.2)
Total Protein: 6.7 g/dL (ref 6.0–8.3)

## 2020-09-04 LAB — CBC
HCT: 44.5 % (ref 39.0–52.0)
Hemoglobin: 15 g/dL (ref 13.0–17.0)
MCHC: 33.8 g/dL (ref 30.0–36.0)
MCV: 92.7 fl (ref 78.0–100.0)
Platelets: 205 10*3/uL (ref 150.0–400.0)
RBC: 4.8 Mil/uL (ref 4.22–5.81)
RDW: 13.4 % (ref 11.5–15.5)
WBC: 3.8 10*3/uL — ABNORMAL LOW (ref 4.0–10.5)

## 2020-09-04 LAB — LIPID PANEL
Cholesterol: 195 mg/dL (ref 0–200)
HDL: 86.1 mg/dL (ref >39.00)
LDL Cholesterol: 100 mg/dL — ABNORMAL HIGH (ref 0–99)
NonHDL: 108.64
Total CHOL/HDL Ratio: 2
Triglycerides: 45 mg/dL (ref 0.0–149.0)
VLDL: 9 mg/dL (ref 0.0–40.0)

## 2020-09-04 LAB — PSA: PSA: 3.9 ng/mL (ref 0.10–4.00)

## 2020-09-04 NOTE — Progress Notes (Signed)
Labs look good.  PSA has decreased.  CBC is essentially normal.  Myeloma panel is pending.

## 2020-09-04 NOTE — Telephone Encounter (Signed)
Pt said they ordered an additional lab test for him this morning and he was here this morning he said he had them draw extra blood. He needed to know if he still needs to come back. Please advise

## 2020-09-04 NOTE — Telephone Encounter (Signed)
Spoke with patent

## 2020-09-04 NOTE — Addendum Note (Signed)
Addended by: Jon Billings on: 09/04/2020 08:51 AM   Modules accepted: Orders

## 2020-09-10 LAB — MULTIPLE MYELOMA CASCADE WITH REFLEX TO SIFE AND SFLC
A/G Ratio: 2 — ABNORMAL HIGH (ref 0.7–1.7)
Albumin ELP: 4.4 g/dL (ref 2.9–4.4)
Alpha 1: 0.1 g/dL (ref 0.0–0.4)
Alpha 2: 0.5 g/dL (ref 0.4–1.0)
Beta: 0.8 g/dL (ref 0.7–1.3)
Gamma Globulin: 0.7 g/dL (ref 0.4–1.8)
Globulin, Total: 2.2 g/dL (ref 2.2–3.9)
Reflex Testing: 0
Total Protein: 6.6 g/dL (ref 6.0–8.5)

## 2020-09-10 LAB — COMMENT:

## 2020-09-10 LAB — FREE K+L LT CHAINS,QN,S
Ig Kappa Free Light Chain: 22.1 mg/L — ABNORMAL HIGH (ref 3.3–19.4)
Ig Lambda Free Light Chain: 16.2 mg/L (ref 5.7–26.3)
KAPPA/LAMBDA RATIO: 1.36 (ref 0.26–1.65)

## 2020-09-11 ENCOUNTER — Telehealth: Payer: Self-pay

## 2020-09-11 DIAGNOSIS — R937 Abnormal findings on diagnostic imaging of other parts of musculoskeletal system: Secondary | ICD-10-CM | POA: Insufficient documentation

## 2020-09-11 DIAGNOSIS — D8989 Other specified disorders involving the immune mechanism, not elsewhere classified: Secondary | ICD-10-CM | POA: Insufficient documentation

## 2020-09-11 DIAGNOSIS — N429 Disorder of prostate, unspecified: Secondary | ICD-10-CM | POA: Insufficient documentation

## 2020-09-11 NOTE — Addendum Note (Signed)
Addended by: Jon Billings on: 09/11/2020 04:58 PM   Modules accepted: Orders

## 2020-09-11 NOTE — Telephone Encounter (Signed)
Patient calling to discuss Myeloma panel results. Please advise

## 2020-09-11 NOTE — Addendum Note (Signed)
Addended by: Jon Billings on: 09/11/2020 12:46 PM   Modules accepted: Orders

## 2020-09-12 ENCOUNTER — Encounter: Payer: Self-pay | Admitting: Family Medicine

## 2020-09-12 ENCOUNTER — Telehealth (INDEPENDENT_AMBULATORY_CARE_PROVIDER_SITE_OTHER): Payer: Medicare Other | Admitting: Family Medicine

## 2020-09-12 VITALS — Ht 70.0 in

## 2020-09-12 DIAGNOSIS — M7062 Trochanteric bursitis, left hip: Secondary | ICD-10-CM

## 2020-09-12 DIAGNOSIS — D8989 Other specified disorders involving the immune mechanism, not elsewhere classified: Secondary | ICD-10-CM

## 2020-09-12 DIAGNOSIS — R937 Abnormal findings on diagnostic imaging of other parts of musculoskeletal system: Secondary | ICD-10-CM | POA: Diagnosis not present

## 2020-09-12 NOTE — Addendum Note (Signed)
Addended by: Jon Billings on: 09/12/2020 12:13 PM   Modules accepted: Orders

## 2020-09-12 NOTE — Telephone Encounter (Signed)
Patient aware of message video visit to discuss labs scheduled

## 2020-09-12 NOTE — Progress Notes (Signed)
Established Patient Office Visit  Subjective:  Patient ID: Anthony Greene, male    DOB: 1950/03/04  Age: 70 y.o. MRN: [id number]  CC:  Chief Complaint  Patient presents with   Advice Only    Discuss labs Is there a definitive test? Bone marrow biopsy? Protein electrophoresis to follow? Can testing be done this week? Do I need to see a hematologist? What about back surgery  What about my infusions (simponi aria)     HPI Anthony Greene presents for follow-up and discussion of recent x-ray findings in his pelvis and proximal femurs as well as recent blood work.  He is running no fevers and feeling well.  Denies any cough or sputum production.  Denies night sweats or weight loss.  X-ray findings of his pelvis and proximal femurs were concerning for possible lytic lesions.  Electrophoresis of the pends serum felt to show a prominent M band.'s of analysis showed a mildly elevated kappa free light chains.  Past Medical History:  Diagnosis Date   Arthritis    Closed fracture of transverse process of lumbar vertebra ([place]) 06/06/2017   Hematoma of hip, right, initial encounter 06/23/2017   Injury resulting from fall from height 06/07/2017   Intractable low back pain 06/07/2017   Neuropathy    Osteoarthritis    Pilonidal abscess 06/22/2017   Pilonidal cyst 06/22/2017   Psoriasis    Psoriatic arthritis ([place])    Sebaceous cyst     Past Surgical History:  Procedure Laterality Date   CARPAL TUNNEL RELEASE Right    COLONOSCOPY WITH PROPOFOL     DRESSING CHANGE UNDER ANESTHESIA N/A 06/25/2017   Procedure: DRESSING CHANGE UNDER ANESTHESIA;  Surgeon: Florene Glen, MD;  Location: [facility] OR[facility];  Service: General;  Laterality: N/A;   IRRIGATION AND DEBRIDEMENT BUTTOCKS Right 06/23/2017   Procedure: IRRIGATION AND DEBRIDEMENT BUTTOCKS;  Surgeon: Vickie Epley, MD;  Location: [facility];  Service: General;  Laterality: Right;   JOINT REPLACEMENT     BL knee   KNEE ARTHROSCOPY Right    PILONIDAL CYST  EXCISION N/A 01/20/2018   Procedure: EXCISION OF INCREASINGLY SYMPTOMATIC PILONIDAL CYST AND RIGHT OF UPPER BACK SEBACEOUS CYST;  Surgeon: Vickie Epley, MD;  Location: [facility];  Service: General;  Laterality: N/A;   TONSILLECTOMY      Family History  Problem Relation Age of Onset   Asthma Mother    COPD Mother    Stroke Father    Heart disease Father    Cancer Brother     Social History   Socioeconomic History   Marital status: Married    Spouse name: Not on file   Number of children: Not on file   Years of education: Not on file   Highest education level: Not on file  Occupational History   Not on file  Tobacco Use   Smoking status: Former    Types: Cigars   Smokeless tobacco: Never  Vaping Use   Vaping Use: Never used  Substance and Sexual Activity   Alcohol use: Yes    Comment: 1-3 alcoholic drinks daily   Drug use: Never   Sexual activity: Yes  Other Topics Concern   Not on file  Social History Narrative   Not on file   Social Determinants of Health   Financial Resource Strain: Low Risk    Difficulty of Paying Living Expenses: Not hard at all  Food Insecurity: No Food Insecurity   Worried About Charity fundraiser in the  Last Year: Never true   Ran Out of Food in the Last Year: Never true  Transportation Needs: No Transportation Needs   Lack of Transportation (Medical): No   Lack of Transportation (Non-Medical): No  Physical Activity: Sufficiently Active   Days of Exercise per Week: 5 days   Minutes of Exercise per Session: 30 min  Stress: No Stress Concern Present   Feeling of Stress : Not at all  Social Connections: Moderately Isolated   Frequency of Communication with Friends and Family: More than three times a week   Frequency of Social Gatherings with Friends and Family: More than three times a week   Attends Religious Services: Never   Marine scientist or Organizations: No   Attends Music therapist: Never   Marital Status:  Married  Human resources officer Violence: Not At Risk   Fear of Current or Ex-Partner: No   Emotionally Abused: No   Physically Abused: No   Sexually Abused: No    Outpatient Medications Prior to Visit  Medication Sig Dispense Refill   celecoxib (CELEBREX) 200 MG capsule Take 200 mg by mouth at bedtime.      Famotidine (PEPCID PO) Take by mouth.     Golimumab ([place] ARIA IV) Inject into the vein.     tamsulosin (FLOMAX) 0.4 MG CAPS capsule Take 2 capsules (0.8 mg total) by mouth daily. **Need appt before next refill** 180 capsule 1   No facility-administered medications prior to visit.    Allergies  Allergen Reactions   Dilaudid [Hydromorphone Hcl] Nausea And Vomiting   Morphine And Related Nausea And Vomiting   Vancomycin Other (See Comments)    redman syndrome   Penicillins Rash    Patient has only had rash reaction to PCN in the past.  Patient and wife state he Tolerates keflex and  Ancef with no problem.  Has patient had a PCN reaction causing immediate rash, facial/tongue/throat swelling, SOB or lightheadedness with hypotension: Yes- rash only Has patient had a PCN reaction causing severe rash involving mucus membranes or skin necrosis: No Has patient had a PCN reaction that required hospitalization: No Has patient had a PCN reaction occurring within the last 10 years: No    ROS Review of Systems  Constitutional:  Negative for chills, diaphoresis, fatigue, fever and unexpected weight change.  Respiratory:  Negative for cough, shortness of breath and wheezing.   Musculoskeletal:  Positive for arthralgias (lateral left hip pain) and back pain.  Psychiatric/Behavioral: Negative.       Objective:    Physical Exam Vitals and nursing note reviewed.  Pulmonary:     Effort: Pulmonary effort is normal.  Neurological:     Mental Status: He is alert and oriented to person, place, and time.  Psychiatric:        Mood and Affect: Mood normal.        Behavior: Behavior normal.     Ht 5' 10"  (1.778 m)   BMI 32.03 kg/m  Wt Readings from Last 3 Encounters:  09/03/20 223 lb 3.2 oz (101.2 kg)  01/14/20 225 lb 9.6 oz (102.3 kg)  06/15/19 214 lb (97.1 kg)     Health Maintenance Due  Topic Date Due   Hepatitis C Screening  Never done   COVID-19 Vaccine (5 - Booster for Pfizer series) 09/07/2020   INFLUENZA VACCINE  08/18/2020    There are no preventive care reminders to display for this patient.  Lab Results  Component Value Date   TSH  1.87 02/26/2019   Lab Results  Component Value Date   WBC 3.8 (L) 09/04/2020   HGB 15.0 09/04/2020   HCT 44.5 09/04/2020   MCV 92.7 09/04/2020   PLT 205.0 09/04/2020   Lab Results  Component Value Date   NA 138 09/04/2020   K 4.2 09/04/2020   CO2 26 09/04/2020   GLUCOSE 93 09/04/2020   BUN 15 09/04/2020   CREATININE 0.92 09/04/2020   BILITOT 0.8 09/04/2020   ALKPHOS 39 09/04/2020   AST 16 09/04/2020   ALT 14 09/04/2020   PROT 6.6 09/04/2020   ALBUMIN 4.2 09/04/2020   CALCIUM 9.8 09/04/2020   ANIONGAP 9 06/23/2017   GFR 83.93 09/04/2020   Lab Results  Component Value Date   CHOL 195 09/04/2020   Lab Results  Component Value Date   HDL 86.10 09/04/2020   Lab Results  Component Value Date   LDLCALC 100 (H) 09/04/2020   Lab Results  Component Value Date   TRIG 45.0 09/04/2020   Lab Results  Component Value Date   CHOLHDL 2 09/04/2020   No results found for: HGBA1C    Assessment & Plan:   Problem List Items Addressed This Visit       Musculoskeletal and Integument   Trochanteric bursitis of left hip - Primary     Other   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   Follow-up: No follow-ups on file.   Will return for 24-hour urine jug, CBC with differential and chest x-ray.  MRI has been scheduled.  Virtual Visit via Telephone Note  I connected with [name] on 09/12/20 at  4:15 PM EDT by telephone and verified that I am speaking with the correct person using two identifiers.  Location: Patient: with his wife.  Provider: work   I discussed the limitations, risks, security and privacy concerns of performing an evaluation and management service by telephone and the availability of in person appointments. I also discussed with the patient that there may be a patient responsible charge related to this service. The patient expressed understanding and agreed to proceed.   History of Present Illness:    Observations/Objective:   Assessment and Plan:   Follow Up Instructions:    I discussed the assessment and treatment plan with the patient. The patient was provided an opportunity to ask questions and all were answered. The patient agreed with the plan and demonstrated an understanding of the instructions.   The patient was advised to call back or seek an in-person evaluation if the symptoms worsen or if the condition fails to improve as anticipated.  I provided 20 minutes of non-face-to-face time during this encounter.   Libby Maw, MD  Libby Maw, MD  Interactive video and audio telecommunications were attempted between myself and the patient. However they failed due to the patient having technical difficulties or not having access to video capability. We continued and completed with audio only. Interactive video and audio telecommunications were attempted between myself and the patient. However they failed due to the patient having technical difficulties or not having access to video capability. We continued and completed with audio only.

## 2020-09-15 ENCOUNTER — Ambulatory Visit (INDEPENDENT_AMBULATORY_CARE_PROVIDER_SITE_OTHER): Payer: Medicare Other

## 2020-09-15 ENCOUNTER — Other Ambulatory Visit: Payer: Self-pay

## 2020-09-15 ENCOUNTER — Other Ambulatory Visit (INDEPENDENT_AMBULATORY_CARE_PROVIDER_SITE_OTHER): Payer: Medicare Other

## 2020-09-15 DIAGNOSIS — R937 Abnormal findings on diagnostic imaging of other parts of musculoskeletal system: Secondary | ICD-10-CM | POA: Diagnosis not present

## 2020-09-15 DIAGNOSIS — S22059A Unspecified fracture of T5-T6 vertebra, initial encounter for closed fracture: Secondary | ICD-10-CM | POA: Diagnosis not present

## 2020-09-15 DIAGNOSIS — M5134 Other intervertebral disc degeneration, thoracic region: Secondary | ICD-10-CM | POA: Diagnosis not present

## 2020-09-15 DIAGNOSIS — R935 Abnormal findings on diagnostic imaging of other abdominal regions, including retroperitoneum: Secondary | ICD-10-CM | POA: Diagnosis not present

## 2020-09-15 LAB — CBC WITH DIFFERENTIAL/PLATELET
Basophils Absolute: 0 10*3/uL (ref 0.0–0.1)
Basophils Relative: 0.6 % (ref 0.0–3.0)
Eosinophils Absolute: 0.1 10*3/uL (ref 0.0–0.7)
Eosinophils Relative: 3.2 % (ref 0.0–5.0)
HCT: 43.9 % (ref 39.0–52.0)
Hemoglobin: 14.6 g/dL (ref 13.0–17.0)
Lymphocytes Relative: 39.6 % (ref 12.0–46.0)
Lymphs Abs: 1.7 10*3/uL (ref 0.7–4.0)
MCHC: 33.3 g/dL (ref 30.0–36.0)
MCV: 93.7 fl (ref 78.0–100.0)
Monocytes Absolute: 0.5 10*3/uL (ref 0.1–1.0)
Monocytes Relative: 11.2 % (ref 3.0–12.0)
Neutro Abs: 2 10*3/uL (ref 1.4–7.7)
Neutrophils Relative %: 45.4 % (ref 43.0–77.0)
Platelets: 217 10*3/uL (ref 150.0–400.0)
RBC: 4.69 Mil/uL (ref 4.22–5.81)
RDW: 13.2 % (ref 11.5–15.5)
WBC: 4.4 10*3/uL (ref 4.0–10.5)

## 2020-09-15 NOTE — Progress Notes (Signed)
Per the orders of Dr. Ethelene Hal pt is here for labs, pt tolerated draw well. Pt has received information on how to complete 24hr urine.

## 2020-09-15 NOTE — Progress Notes (Unsigned)
Error

## 2020-09-17 ENCOUNTER — Other Ambulatory Visit: Payer: Medicare Other

## 2020-09-17 ENCOUNTER — Other Ambulatory Visit: Payer: Self-pay

## 2020-09-17 DIAGNOSIS — D8989 Other specified disorders involving the immune mechanism, not elsewhere classified: Secondary | ICD-10-CM | POA: Diagnosis not present

## 2020-09-17 MED ORDER — DIAZEPAM 5 MG PO TABS: ORAL_TABLET | ORAL | 0 refills | Status: DC

## 2020-09-17 NOTE — Progress Notes (Signed)
Per the orders of Dr. Ethelene Hal pt has provide urin  sample for 24 hr urine. sample was a sufficient amount.

## 2020-09-20 ENCOUNTER — Other Ambulatory Visit: Payer: Medicare Other

## 2020-09-20 ENCOUNTER — Ambulatory Visit (HOSPITAL_BASED_OUTPATIENT_CLINIC_OR_DEPARTMENT_OTHER): Payer: Medicare Other

## 2020-09-20 ENCOUNTER — Ambulatory Visit (HOSPITAL_BASED_OUTPATIENT_CLINIC_OR_DEPARTMENT_OTHER): Admission: RE | Admit: 2020-09-20 | Payer: Medicare Other | Source: Ambulatory Visit

## 2020-09-24 ENCOUNTER — Ambulatory Visit: Payer: Medicare Other | Admitting: Sports Medicine

## 2020-09-24 ENCOUNTER — Other Ambulatory Visit: Payer: Medicare Other

## 2020-09-24 DIAGNOSIS — M150 Primary generalized (osteo)arthritis: Secondary | ICD-10-CM | POA: Diagnosis not present

## 2020-09-24 DIAGNOSIS — L409 Psoriasis, unspecified: Secondary | ICD-10-CM | POA: Diagnosis not present

## 2020-09-24 DIAGNOSIS — Z79899 Other long term (current) drug therapy: Secondary | ICD-10-CM | POA: Diagnosis not present

## 2020-09-24 DIAGNOSIS — Z23 Encounter for immunization: Secondary | ICD-10-CM | POA: Diagnosis not present

## 2020-09-24 DIAGNOSIS — L4050 Arthropathic psoriasis, unspecified: Secondary | ICD-10-CM | POA: Diagnosis not present

## 2020-09-24 DIAGNOSIS — E669 Obesity, unspecified: Secondary | ICD-10-CM | POA: Diagnosis not present

## 2020-09-24 DIAGNOSIS — Z6831 Body mass index (BMI) 31.0-31.9, adult: Secondary | ICD-10-CM | POA: Diagnosis not present

## 2020-09-24 DIAGNOSIS — M25552 Pain in left hip: Secondary | ICD-10-CM | POA: Diagnosis not present

## 2020-09-24 LAB — UPEP/UIFE/LIGHT CHAINS/TP, 24-HR UR
% BETA, Urine: 0 %
ALBUMIN, U: 100 %
ALPHA 1 URINE: 0 %
ALPHA-2-GLOBULIN, U: 0 %
Free Kappa Lt Chains,Ur: 30.47 mg/L (ref 1.17–86.46)
Free Lambda Lt Chains,Ur: 2.45 mg/L (ref 0.27–15.21)
GAMMA GLOBULIN URINE: 0 %
Kappa/Lambda Ratio,U: 12.44 (ref 1.83–14.26)
Protein, Ur: 6.8 mg/dL

## 2020-09-25 DIAGNOSIS — L4050 Arthropathic psoriasis, unspecified: Secondary | ICD-10-CM | POA: Diagnosis not present

## 2020-09-27 ENCOUNTER — Ambulatory Visit (HOSPITAL_BASED_OUTPATIENT_CLINIC_OR_DEPARTMENT_OTHER)
Admission: RE | Admit: 2020-09-27 | Discharge: 2020-09-27 | Disposition: A | Discharge status: Home / Self Care | Payer: Medicare Other | Source: Ambulatory Visit | Attending: Family Medicine | Admitting: Family Medicine

## 2020-09-27 ENCOUNTER — Ambulatory Visit (HOSPITAL_BASED_OUTPATIENT_CLINIC_OR_DEPARTMENT_OTHER): Payer: Medicare Other

## 2020-09-27 ENCOUNTER — Encounter (HOSPITAL_BASED_OUTPATIENT_CLINIC_OR_DEPARTMENT_OTHER): Payer: Self-pay

## 2020-09-27 ENCOUNTER — Other Ambulatory Visit: Payer: Self-pay

## 2020-09-27 DIAGNOSIS — R937 Abnormal findings on diagnostic imaging of other parts of musculoskeletal system: Secondary | ICD-10-CM

## 2020-09-27 DIAGNOSIS — K4020 Bilateral inguinal hernia, without obstruction or gangrene, not specified as recurrent: Secondary | ICD-10-CM | POA: Diagnosis not present

## 2020-09-27 DIAGNOSIS — M4699 Unspecified inflammatory spondylopathy, multiple sites in spine: Secondary | ICD-10-CM | POA: Diagnosis not present

## 2020-09-27 DIAGNOSIS — M47816 Spondylosis without myelopathy or radiculopathy, lumbar region: Secondary | ICD-10-CM | POA: Diagnosis not present

## 2020-09-27 DIAGNOSIS — M160 Bilateral primary osteoarthritis of hip: Secondary | ICD-10-CM | POA: Diagnosis not present

## 2020-09-27 MED ORDER — GADOBUTROL 1 MMOL/ML IV SOLN
10.0000 mL | Freq: Once | INTRAVENOUS | Status: AC | PRN
  Administered 2020-09-27: 10 mL via INTRAVENOUS

## 2020-09-29 ENCOUNTER — Encounter: Payer: Self-pay | Admitting: Family Medicine

## 2020-09-29 ENCOUNTER — Telehealth (INDEPENDENT_AMBULATORY_CARE_PROVIDER_SITE_OTHER): Payer: Medicare Other | Admitting: Family Medicine

## 2020-09-29 VITALS — Ht 70.0 in

## 2020-09-29 DIAGNOSIS — R937 Abnormal findings on diagnostic imaging of other parts of musculoskeletal system: Secondary | ICD-10-CM

## 2020-09-29 NOTE — Progress Notes (Signed)
Established Patient Office Visit  Subjective:  Patient ID: Anthony Greene, male    DOB: [birth year]  Age: 70 y.o. MRN: [id number]  CC:  Chief Complaint  Patient presents with   Advice Only    Discuss MRI     HPI Anthony Greene presents for follow-up of his MRI results.  Scan was 2 days ago on Saturday.  Plain films of the pelvis and proximal femurs and raise the possibility of lytic lesions.  Multiple myeloma was a consideration.  Laboratory testing for myeloma were negative.  Follow-up 24-hour urine testing was negative for myeloma spike and free kappa light chains.  Past Medical History:  Diagnosis Date   Arthritis    Closed fracture of transverse process of lumbar vertebra ([place]) 06/06/2017   Hematoma of hip, right, initial encounter 06/23/2017   Injury resulting from fall from height 06/07/2017   Intractable low back pain 06/07/2017   Neuropathy    Osteoarthritis    Pilonidal abscess 06/22/2017   Pilonidal cyst 06/22/2017   Psoriasis    Psoriatic arthritis ([place])    Sebaceous cyst     Past Surgical History:  Procedure Laterality Date   CARPAL TUNNEL RELEASE Right    COLONOSCOPY WITH PROPOFOL     DRESSING CHANGE UNDER ANESTHESIA N/A 06/25/2017   Procedure: DRESSING CHANGE UNDER ANESTHESIA;  Surgeon: Florene Glen, MD;  Location: [facility];  Service: General;  Laterality: N/A;   IRRIGATION AND DEBRIDEMENT BUTTOCKS Right 06/23/2017   Procedure: IRRIGATION AND DEBRIDEMENT BUTTOCKS;  Surgeon: Vickie Epley, MD;  Location: [facility];  Service: General;  Laterality: Right;   JOINT REPLACEMENT     BL knee   KNEE ARTHROSCOPY Right    PILONIDAL CYST EXCISION N/A 01/20/2018   Procedure: EXCISION OF INCREASINGLY SYMPTOMATIC PILONIDAL CYST AND RIGHT OF UPPER BACK SEBACEOUS CYST;  Surgeon: Vickie Epley, MD;  Location: [facility];  Service: General;  Laterality: N/A;   TONSILLECTOMY      Family History  Problem Relation Age of Onset   Asthma Mother    COPD Mother    Stroke Father     Heart disease Father    Cancer Brother     Social History   Socioeconomic History   Marital status: Married    Spouse name: Not on file   Number of children: Not on file   Years of education: Not on file   Highest education level: Not on file  Occupational History   Not on file  Tobacco Use   Smoking status: Former    Types: Cigars   Smokeless tobacco: Never  Vaping Use   Vaping Use: Never used  Substance and Sexual Activity   Alcohol use: Yes    Comment: 1-3 alcoholic drinks daily   Drug use: Never   Sexual activity: Yes  Other Topics Concern   Not on file  Social History Narrative   Not on file   Social Determinants of Health   Financial Resource Strain: Low Risk    Difficulty of Paying Living Expenses: Not hard at all  Food Insecurity: No Food Insecurity   Worried About Charity fundraiser in the Last Year: Never true   Arboriculturist in the Last Year: Never true  Transportation Needs: No Transportation Needs   Lack of Transportation (Medical): No   Lack of Transportation (Non-Medical): No  Physical Activity: Sufficiently Active   Days of Exercise per Week: 5 days   Minutes of Exercise per Session: 30 min  Stress: No Stress Concern Present   Feeling of Stress : Not at all  Social Connections: Moderately Isolated   Frequency of Communication with Friends and Family: More than three times a week   Frequency of Social Gatherings with Friends and Family: More than three times a week   Attends Religious Services: Never   Marine scientist or Organizations: No   Attends Music therapist: Never   Marital Status: Married  Human resources officer Violence: Not At Risk   Fear of Current or Ex-Partner: No   Emotionally Abused: No   Physically Abused: No   Sexually Abused: No    Outpatient Medications Prior to Visit  Medication Sig Dispense Refill   celecoxib (CELEBREX) 200 MG capsule Take 200 mg by mouth at bedtime.      Famotidine (PEPCID PO) Take by  mouth.     Golimumab ([place] ARIA IV) Inject into the vein.     tamsulosin (FLOMAX) 0.4 MG CAPS capsule Take 2 capsules (0.8 mg total) by mouth daily. **Need appt before next refill** 180 capsule 1   diazepam (VALIUM) 5 MG tablet Take one tablet 45 minutes prior to procedure. Must have a driver. 1 tablet 0   No facility-administered medications prior to visit.    Allergies  Allergen Reactions   Dilaudid [Hydromorphone Hcl] Nausea And Vomiting   Morphine And Related Nausea And Vomiting   Vancomycin Other (See Comments)    redman syndrome   Penicillins Rash    Patient has only had rash reaction to PCN in the past.  Patient and wife state he Tolerates keflex and  Ancef with no problem.  Has patient had a PCN reaction causing immediate rash, facial/tongue/throat swelling, SOB or lightheadedness with hypotension: Yes- rash only Has patient had a PCN reaction causing severe rash involving mucus membranes or skin necrosis: No Has patient had a PCN reaction that required hospitalization: No Has patient had a PCN reaction occurring within the last 10 years: No    ROS Review of Systems  Constitutional: Negative.   Respiratory: Negative.    Cardiovascular: Negative.   Gastrointestinal: Negative.   Psychiatric/Behavioral: Negative.       Objective:    Physical Exam Vitals and nursing note reviewed.  Constitutional:      Appearance: Normal appearance.  HENT:     Head: Normocephalic and atraumatic.  Eyes:     General:        Right eye: No discharge.        Left eye: No discharge.     Conjunctiva/sclera: Conjunctivae normal.  Pulmonary:     Effort: Pulmonary effort is normal.  Neurological:     Mental Status: He is alert and oriented to person, place, and time.  Psychiatric:        Mood and Affect: Mood normal.        Behavior: Behavior normal.    Ht 5' 10"  (1.778 m)   BMI 32.03 kg/m  Wt Readings from Last 3 Encounters:  09/03/20 223 lb 3.2 oz (101.2 kg)  01/14/20 225 lb  9.6 oz (102.3 kg)  06/15/19 214 lb (97.1 kg)     Health Maintenance Due  Topic Date Due   Hepatitis C Screening  Never done   INFLUENZA VACCINE  08/18/2020    There are no preventive care reminders to display for this patient.  Lab Results  Component Value Date   TSH 1.87 02/26/2019   Lab Results  Component Value Date   WBC 4.4  09/15/2020   HGB 14.6 09/15/2020   HCT 43.9 09/15/2020   MCV 93.7 09/15/2020   PLT 217.0 09/15/2020   Lab Results  Component Value Date   NA 138 09/04/2020   K 4.2 09/04/2020   CO2 26 09/04/2020   GLUCOSE 93 09/04/2020   BUN 15 09/04/2020   CREATININE 0.92 09/04/2020   BILITOT 0.8 09/04/2020   ALKPHOS 39 09/04/2020   AST 16 09/04/2020   ALT 14 09/04/2020   PROT 6.6 09/04/2020   ALBUMIN 4.2 09/04/2020   CALCIUM 9.8 09/04/2020   ANIONGAP 9 06/23/2017   GFR 83.93 09/04/2020   Lab Results  Component Value Date   CHOL 195 09/04/2020   Lab Results  Component Value Date   HDL 86.10 09/04/2020   Lab Results  Component Value Date   LDLCALC 100 (H) 09/04/2020   Lab Results  Component Value Date   TRIG 45.0 09/04/2020   Lab Results  Component Value Date   CHOLHDL 2 09/04/2020   No results found for: HGBA1C    Assessment & Plan:   Problem List Items Addressed This Visit       Other   Abnormal x-ray of pelvis - Primary    No orders of the defined types were placed in this encounter.   Follow-up: No follow-ups on file.   Expecting results of MRI tomorrow Wednesday.  We will call him back when they come in. Libby Maw, MD  Virtual Visit via Video Note  I connected with [name] on 09/29/20 at  4:00 PM EDT by a video enabled telemedicine application and verified that I am speaking with the correct person using two identifiers.  Location: Patient: home with wife.   Provider: work   I discussed the limitations of evaluation and management by telemedicine and the availability of in person appointments.  The patient expressed understanding and agreed to proceed.  History of Present Illness:    Observations/Objective:   Assessment and Plan:   Follow Up Instructions:    I discussed the assessment and treatment plan with the patient. The patient was provided an opportunity to ask questions and all were answered. The patient agreed with the plan and demonstrated an understanding of the instructions.   The patient was advised to call back or seek an in-person evaluation if the symptoms worsen or if the condition fails to improve as anticipated.  I provided 15 minutes of non-face-to-face time during this encounter.   Libby Maw, MD

## 2020-10-03 ENCOUNTER — Encounter: Payer: Self-pay | Admitting: Family Medicine

## 2020-10-06 ENCOUNTER — Ambulatory Visit: Payer: Self-pay

## 2020-10-06 ENCOUNTER — Ambulatory Visit (INDEPENDENT_AMBULATORY_CARE_PROVIDER_SITE_OTHER): Payer: Medicare Other | Admitting: Family Medicine

## 2020-10-06 ENCOUNTER — Other Ambulatory Visit: Payer: Self-pay

## 2020-10-06 ENCOUNTER — Encounter: Payer: Self-pay | Admitting: Family Medicine

## 2020-10-06 VITALS — Ht 70.0 in | Wt 215.0 lb

## 2020-10-06 DIAGNOSIS — M25552 Pain in left hip: Secondary | ICD-10-CM

## 2020-10-06 MED ORDER — METHYLPREDNISOLONE ACETATE 40 MG/ML IJ SUSP
40.0000 mg | Freq: Once | INTRAMUSCULAR | Status: AC
  Administered 2020-10-06: 40 mg via INTRA_ARTICULAR

## 2020-10-06 NOTE — Progress Notes (Signed)
Anthony Greene - 70 y.o. male MRN [id number]  Date of birth: 11/16/1950  SUBJECTIVE:  Including CC & ROS.  No chief complaint on file.   Anthony Greene is a 70 y.o. male that is presenting with lateral hip pain.  It is worse when lying on the affected side.  Has been ongoing for about 4 5 weeks..  Independent review of the left hip x-ray from 8/17 shows moderately severe changes of the left hip.  Independent review of MRI of the pelvis from 9/10 shows degenerative changes of the hips.   Review of Systems See HPI   HISTORY: Past Medical, Surgical, Social, and Family History Reviewed & Updated per EMR.   Pertinent Historical Findings include:  Past Medical History:  Diagnosis Date   Arthritis    Closed fracture of transverse process of lumbar vertebra ([place]) 06/06/2017   Hematoma of hip, right, initial encounter 06/23/2017   Injury resulting from fall from height 06/07/2017   Intractable low back pain 06/07/2017   Neuropathy    Osteoarthritis    Pilonidal abscess 06/22/2017   Pilonidal cyst 06/22/2017   Psoriasis    Psoriatic arthritis ([place])    Sebaceous cyst     Past Surgical History:  Procedure Laterality Date   CARPAL TUNNEL RELEASE Right    COLONOSCOPY WITH PROPOFOL     DRESSING CHANGE UNDER ANESTHESIA N/A 06/25/2017   Procedure: DRESSING CHANGE UNDER ANESTHESIA;  Surgeon: Florene Glen, MD;  Location: [facility];  Service: General;  Laterality: N/A;   IRRIGATION AND DEBRIDEMENT BUTTOCKS Right 06/23/2017   Procedure: IRRIGATION AND DEBRIDEMENT BUTTOCKS;  Surgeon: Vickie Epley, MD;  Location: [facility];  Service: General;  Laterality: Right;   JOINT REPLACEMENT     BL knee   KNEE ARTHROSCOPY Right    PILONIDAL CYST EXCISION N/A 01/20/2018   Procedure: EXCISION OF INCREASINGLY SYMPTOMATIC PILONIDAL CYST AND RIGHT OF UPPER BACK SEBACEOUS CYST;  Surgeon: Vickie Epley, MD;  Location: [facility];  Service: General;  Laterality: N/A;   TONSILLECTOMY      Family History  Problem  Relation Age of Onset   Asthma Mother    COPD Mother    Stroke Father    Heart disease Father    Cancer Brother     Social History   Socioeconomic History   Marital status: Married    Spouse name: Not on file   Number of children: Not on file   Years of education: Not on file   Highest education level: Not on file  Occupational History   Not on file  Tobacco Use   Smoking status: Former    Types: Cigars   Smokeless tobacco: Never  Vaping Use   Vaping Use: Never used  Substance and Sexual Activity   Alcohol use: Yes    Comment: 1-3 alcoholic drinks daily   Drug use: Never   Sexual activity: Yes  Other Topics Concern   Not on file  Social History Narrative   Not on file   Social Determinants of Health   Financial Resource Strain: Low Risk    Difficulty of Paying Living Expenses: Not hard at all  Food Insecurity: No Food Insecurity   Worried About Charity fundraiser in the Last Year: Never true   Arboriculturist in the Last Year: Never true  Transportation Needs: No Transportation Needs   Lack of Transportation (Medical): No   Lack of Transportation (Non-Medical): No  Physical Activity: Sufficiently Active   Days  of Exercise per Week: 5 days   Minutes of Exercise per Session: 30 min  Stress: No Stress Concern Present   Feeling of Stress : Not at all  Social Connections: Moderately Isolated   Frequency of Communication with Friends and Family: More than three times a week   Frequency of Social Gatherings with Friends and Family: More than three times a week   Attends Religious Services: Never   Marine scientist or Organizations: No   Attends Music therapist: Never   Marital Status: Married  Human resources officer Violence: Not At Risk   Fear of Current or Ex-Partner: No   Emotionally Abused: No   Physically Abused: No   Sexually Abused: No     PHYSICAL EXAM:  VS: Ht 5\' 10"  (1.778 m)   Wt 215 lb (97.5 kg)   BMI 30.85 kg/m  Physical  Exam Gen: NAD, alert, cooperative with exam, well-appearing    Aspiration/Injection Procedure Note Shedric Fredericks August 27, 1950  Procedure: Injection Indications: Left hip pain  Procedure Details Consent: Risks of procedure as well as the alternatives and risks of each were explained to the (patient/caregiver).  Consent for procedure obtained. Time Out: Verified patient identification, verified procedure, site/side was marked, verified correct patient position, special equipment/implants available, medications/allergies/relevent history reviewed, required imaging and test results available.  Performed.  The area was cleaned with iodine and alcohol swabs.    The left trochanteric bursa was injected using 1 cc's of 40 mg Depo-Medrol and 4 cc's of 0.25% bupivacaine with a 22 3 1/2" needle.  Ultrasound was used. Images were obtained in short views showing the injection.     A sterile dressing was applied.  Patient did tolerate procedure well.      ASSESSMENT & PLAN:   Greater trochanteric pain syndrome of left lower extremity MRI was unrevealing for bursitis but was observed today on ultrasound exam. -Counseled on home exercise therapy and supportive care. -Injection today. -Could consider physical therapy.

## 2020-10-06 NOTE — Patient Instructions (Signed)
Good to see you Please try ice as needed  Please try the exercises   Please send me a message in MyChart with any questions or updates.  Please see me back in 4 weeks.   --Dr. Raeford Razor

## 2020-10-07 NOTE — Assessment & Plan Note (Signed)
MRI was unrevealing for bursitis but was observed today on ultrasound exam. -Counseled on home exercise therapy and supportive care. -Injection today. -Could consider physical therapy.

## 2020-10-10 DIAGNOSIS — Z23 Encounter for immunization: Secondary | ICD-10-CM | POA: Diagnosis not present

## 2020-10-29 ENCOUNTER — Ambulatory Visit: Payer: Medicare Other | Admitting: Podiatry

## 2020-11-07 ENCOUNTER — Encounter: Payer: Self-pay | Admitting: Family Medicine

## 2020-11-07 DIAGNOSIS — M5412 Radiculopathy, cervical region: Secondary | ICD-10-CM

## 2020-11-26 DIAGNOSIS — R5383 Other fatigue: Secondary | ICD-10-CM | POA: Diagnosis not present

## 2020-11-26 DIAGNOSIS — L4050 Arthropathic psoriasis, unspecified: Secondary | ICD-10-CM | POA: Diagnosis not present

## 2020-11-26 DIAGNOSIS — Z79899 Other long term (current) drug therapy: Secondary | ICD-10-CM | POA: Diagnosis not present

## 2020-11-26 DIAGNOSIS — Z111 Encounter for screening for respiratory tuberculosis: Secondary | ICD-10-CM | POA: Diagnosis not present

## 2020-12-04 ENCOUNTER — Encounter: Payer: Self-pay | Admitting: Family Medicine

## 2020-12-17 DIAGNOSIS — Z20822 Contact with and (suspected) exposure to covid-19: Secondary | ICD-10-CM | POA: Diagnosis not present

## 2020-12-17 DIAGNOSIS — J029 Acute pharyngitis, unspecified: Secondary | ICD-10-CM | POA: Diagnosis not present

## 2020-12-17 DIAGNOSIS — R519 Headache, unspecified: Secondary | ICD-10-CM | POA: Diagnosis not present

## 2020-12-17 DIAGNOSIS — R059 Cough, unspecified: Secondary | ICD-10-CM | POA: Diagnosis not present

## 2020-12-25 DIAGNOSIS — J019 Acute sinusitis, unspecified: Secondary | ICD-10-CM | POA: Diagnosis not present

## 2021-01-18 HISTORY — PX: LAMINOTOMY: SHX998

## 2021-01-22 DIAGNOSIS — L4050 Arthropathic psoriasis, unspecified: Secondary | ICD-10-CM | POA: Diagnosis not present

## 2021-01-28 ENCOUNTER — Other Ambulatory Visit: Payer: Self-pay | Admitting: Family Medicine

## 2021-01-28 DIAGNOSIS — R3911 Hesitancy of micturition: Secondary | ICD-10-CM

## 2021-01-28 DIAGNOSIS — N401 Enlarged prostate with lower urinary tract symptoms: Secondary | ICD-10-CM

## 2021-02-13 ENCOUNTER — Other Ambulatory Visit: Payer: Self-pay | Admitting: Neurological Surgery

## 2021-02-13 DIAGNOSIS — M47812 Spondylosis without myelopathy or radiculopathy, cervical region: Secondary | ICD-10-CM | POA: Diagnosis not present

## 2021-02-13 DIAGNOSIS — M4316 Spondylolisthesis, lumbar region: Secondary | ICD-10-CM | POA: Diagnosis not present

## 2021-02-13 DIAGNOSIS — M48061 Spinal stenosis, lumbar region without neurogenic claudication: Secondary | ICD-10-CM

## 2021-03-06 ENCOUNTER — Encounter: Payer: Self-pay | Admitting: Family Medicine

## 2021-03-17 ENCOUNTER — Ambulatory Visit
Admission: RE | Admit: 2021-03-17 | Discharge: 2021-03-17 | Disposition: A | Discharge status: Home / Self Care | Payer: Medicare Other | Source: Ambulatory Visit | Attending: Neurological Surgery | Admitting: Neurological Surgery

## 2021-03-17 ENCOUNTER — Other Ambulatory Visit: Payer: Self-pay

## 2021-03-17 DIAGNOSIS — M48061 Spinal stenosis, lumbar region without neurogenic claudication: Secondary | ICD-10-CM

## 2021-03-17 DIAGNOSIS — R200 Anesthesia of skin: Secondary | ICD-10-CM | POA: Diagnosis not present

## 2021-03-19 DIAGNOSIS — L4050 Arthropathic psoriasis, unspecified: Secondary | ICD-10-CM | POA: Diagnosis not present

## 2021-03-31 DIAGNOSIS — M48061 Spinal stenosis, lumbar region without neurogenic claudication: Secondary | ICD-10-CM | POA: Diagnosis not present

## 2021-03-31 DIAGNOSIS — L4050 Arthropathic psoriasis, unspecified: Secondary | ICD-10-CM | POA: Diagnosis not present

## 2021-03-31 DIAGNOSIS — Z6832 Body mass index (BMI) 32.0-32.9, adult: Secondary | ICD-10-CM | POA: Diagnosis not present

## 2021-03-31 DIAGNOSIS — E669 Obesity, unspecified: Secondary | ICD-10-CM | POA: Diagnosis not present

## 2021-03-31 DIAGNOSIS — M1991 Primary osteoarthritis, unspecified site: Secondary | ICD-10-CM | POA: Diagnosis not present

## 2021-03-31 DIAGNOSIS — L409 Psoriasis, unspecified: Secondary | ICD-10-CM | POA: Diagnosis not present

## 2021-03-31 DIAGNOSIS — Z79899 Other long term (current) drug therapy: Secondary | ICD-10-CM | POA: Diagnosis not present

## 2021-04-01 ENCOUNTER — Telehealth: Payer: Self-pay | Admitting: Family Medicine

## 2021-04-01 NOTE — Chronic Care Management (AMB) (Signed)
?  Chronic Care Management  ? ?Note ? ?04/01/2021 ?Name: Anthony Greene MRN: [id number] DOB: 1950-11-26 ? ?Anthony Greene is a 71 y.o. year old male who is a primary care patient of Libby Maw, MD. I reached out to [name] by phone today in response to a referral sent by Mr. Dyami Kaster's PCP, Libby Maw, MD.  ? ?Mr. Magallon was given information about Chronic Care Management services today including:  ?CCM service includes personalized support from designated clinical staff supervised by his physician, including individualized plan of care and coordination with other care providers ?24/7 contact phone numbers for assistance for urgent and routine care needs. ?Service will only be billed when office clinical staff spend 20 minutes or more in a month to coordinate care. ?Only one practitioner may furnish and bill the service in a calendar month. ?The patient may stop CCM services at any time (effective at the end of the month) by phone call to the office staff. ? ? ?Patient agreed to services and verbal consent obtained.  ? ?Follow up plan: ? ? ?Tatjana Dellinger ?Upstream Scheduler  ?

## 2021-04-13 DIAGNOSIS — M48062 Spinal stenosis, lumbar region with neurogenic claudication: Secondary | ICD-10-CM | POA: Diagnosis not present

## 2021-04-13 DIAGNOSIS — M48061 Spinal stenosis, lumbar region without neurogenic claudication: Secondary | ICD-10-CM | POA: Diagnosis not present

## 2021-04-13 DIAGNOSIS — G8929 Other chronic pain: Secondary | ICD-10-CM | POA: Diagnosis not present

## 2021-04-13 DIAGNOSIS — M5416 Radiculopathy, lumbar region: Secondary | ICD-10-CM | POA: Diagnosis not present

## 2021-04-28 ENCOUNTER — Telehealth: Payer: Self-pay

## 2021-04-28 NOTE — Progress Notes (Signed)
? ? ?  Chronic Care Management ?Pharmacy Assistant  ? ?Name: Anthony Greene  MRN: [id number] DOB: [birth year] ? ?Chart Review for the clinical pharmacist on 05/07/2021 at 9:30 am. ? ?Conditions to be addressed/monitored: ?Hearing loss, Psoriatic arthritis,Benign prostatic hyperplasia with urinary hesitancy, Intractable low back pain, Anemia, paresthesias, Spinal stenosis of lumbosacral region  ? ?Primary concerns for visit include: ?Overall health  ? ?Recent office visits:  ?No recent office visit ? ?Recent consult visits:  ?03/31/2021 Gavin Pound MD (Rheumatology) No medication Changes noted ?03/19/2021 Leigh Aurora MD (Rheumatology) Unable to see note ?02/13/2021 Kristeen Miss MD (Neurosurgery) No medication Changes noted ?12/25/2020 Rosario Adie MD (Emergency Medicine) Unable to see note ?11/26/2020 Gavin Pound MD (Rheumatology) Unable to see note ? ?Hospital visits:  ?Medication Reconciliation was completed by comparing discharge summary, patient?s EMR and Pharmacy list, and upon discussion with patient. ? ?Admitted to the hospital on 12/17/2020 due to Cough. Discharge date was 12/17/2020. Discharged from [facility].   ? ?Unable to see note ? ?Questions for Clinical Pharmacist:  ? ?1.Are you able to connect with Patient Yes  ?  ?  ?2.Confirmed appointment date/time with patient/caregiver? Confirm appointment on 05/07/2021 at 9:30 am with Daron Offer CPP ?  ? ?  ?3.Visit type telephone ?  ?  ?4.Patient/Caregiver instructed to bring medications to appointment. Patient is aware to bring all medications and supplements to appointment ? ?  ?5.What, if any, problems do you have getting your medications from the pharmacy? None ?  ? ?  ?6.What is your top health concern to discuss at your upcoming visit? ? Patient denies any health concerns or issue at this time. ?  ?7.Have you seen any other providers since your last visit? No ? Patient denies seeing any other providers. ? ?Medications: ?Outpatient Encounter  Medications as of 04/28/2021  ?Medication Sig  ? celecoxib (CELEBREX) 200 MG capsule Take 200 mg by mouth at bedtime.   ? Famotidine (PEPCID PO) Take by mouth.  ? Golimumab ([place] ARIA IV) Inject into the vein.  ? tamsulosin (FLOMAX) 0.4 MG CAPS capsule TAKE 2 CAPSULES (0.8 MG TOTAL) BY MOUTH DAILY. (NEED APPT BEFORE NEXT REFILL)  ? ?No facility-administered encounter medications on file as of 04/28/2021.  ? ? ?Care Gaps: ?Hepatitis C Screening ?Pneumonia Vaccine ? ?Star Rating Drugs: ?None ID ? ?Medication Fill Gaps: ?None ID ? ?Bessie Kellihan,CPA ?Clinical Pharmacist Assistant ?[phone]  ? ?

## 2021-05-06 ENCOUNTER — Encounter: Payer: Self-pay | Admitting: Family Medicine

## 2021-05-06 DIAGNOSIS — L821 Other seborrheic keratosis: Secondary | ICD-10-CM | POA: Diagnosis not present

## 2021-05-06 DIAGNOSIS — L814 Other melanin hyperpigmentation: Secondary | ICD-10-CM | POA: Diagnosis not present

## 2021-05-06 DIAGNOSIS — C44619 Basal cell carcinoma of skin of left upper limb, including shoulder: Secondary | ICD-10-CM | POA: Diagnosis not present

## 2021-05-06 DIAGNOSIS — C4441 Basal cell carcinoma of skin of scalp and neck: Secondary | ICD-10-CM | POA: Diagnosis not present

## 2021-05-06 DIAGNOSIS — Z85828 Personal history of other malignant neoplasm of skin: Secondary | ICD-10-CM | POA: Diagnosis not present

## 2021-05-06 DIAGNOSIS — L570 Actinic keratosis: Secondary | ICD-10-CM | POA: Diagnosis not present

## 2021-05-07 ENCOUNTER — Telehealth: Payer: Medicare Other

## 2021-05-07 NOTE — Progress Notes (Deleted)
Chronic Care Management Pharmacy Note  05/07/2021 Name:  Anthony Greene MRN:  [id number] DOB:  04/27/1950  Summary: ***  Recommendations/Changes made from today's visit: ***  Plan: ***   Subjective: Anthony Greene is an 71 y.o. year old male who is a primary patient of Libby Maw, MD.  The CCM team was consulted for assistance with disease management and care coordination needs.    Engaged with patient by telephone for initial visit in response to provider referral for pharmacy case management and/or care coordination services.   Consent to Services:  The patient was given the following information about Chronic Care Management services today, agreed to services, and gave verbal consent: 1. CCM service includes personalized support from designated clinical staff supervised by the primary care provider, including individualized plan of care and coordination with other care providers 2. 24/7 contact phone numbers for assistance for urgent and routine care needs. 3. Service will only be billed when office clinical staff spend 20 minutes or more in a month to coordinate care. 4. Only one practitioner may furnish and bill the service in a calendar month. 5.The patient may stop CCM services at any time (effective at the end of the month) by phone call to the office staff. 6. The patient will be responsible for cost sharing (co-pay) of up to 20% of the service fee (after annual deductible is met). Patient agreed to services and consent obtained.  Patient Care Team: Libby Maw, MD as PCP - General (Family Medicine) Germaine Pomfret, [facility] as Pharmacist (Pharmacist)  Recent office visits: 09/29/20: Patient presented to Dr. Ethelene Hal for follow-up.    Recent consult visits: 03/31/2021 Gavin Pound MD (Rheumatology) No medication Changes noted 03/19/2021 Leigh Aurora MD (Rheumatology) Unable to see note 02/13/2021 Kristeen Miss MD (Neurosurgery) No medication Changes  noted 12/25/2020 Rosario Adie MD (Emergency Medicine) Unable to see note 11/26/2020 Gavin Pound MD (Rheumatology) Unable to see note  Hospital visits: {Hospital DC Yes/No:25215}   Objective:  Lab Results  Component Value Date   CREATININE 0.92 09/04/2020   BUN 15 09/04/2020   GFR 83.93 09/04/2020   GFRNONAA 83 06/12/2019   GFRAA 96 06/12/2019   NA 138 09/04/2020   K 4.2 09/04/2020   CALCIUM 9.8 09/04/2020   CO2 26 09/04/2020   GLUCOSE 93 09/04/2020    Lab Results  Component Value Date/Time   GFR 83.93 09/04/2020 08:35 AM   GFR 79.81 12/22/2017 08:06 AM    Last diabetic Eye exam: No results found for: HMDIABEYEEXA  Last diabetic Foot exam: No results found for: HMDIABFOOTEX   Lab Results  Component Value Date   CHOL 195 09/04/2020   HDL 86.10 09/04/2020   LDLCALC 100 (H) 09/04/2020   TRIG 45.0 09/04/2020   CHOLHDL 2 09/04/2020       Latest Ref Rng & Units 09/04/2020    9:05 AM 09/04/2020    8:35 AM 06/15/2019    8:59 AM  Hepatic Function  Total Protein 6.0 - 8.5 g/dL 6.6   6.7   6.7    Albumin 3.5 - 5.2 g/dL  4.2     AST 0 - 37 U/L  16     ALT 0 - 53 U/L  14     Alk Phosphatase 39 - 117 U/L  39     Total Bilirubin 0.2 - 1.2 mg/dL  0.8       Lab Results  Component Value Date/Time   TSH 1.87 02/26/2019 11:36 AM  Latest Ref Rng & Units 09/15/2020    9:23 AM 09/04/2020    8:35 AM 06/12/2019   12:00 AM  CBC  WBC 4.0 - 10.5 K/uL 4.4   3.8   4.3       Hemoglobin 13.0 - 17.0 g/dL 14.6   15.0   14.8       Hematocrit 39.0 - 52.0 % 43.9   44.5   43       Platelets 150.0 - 400.0 K/uL 217.0   205.0   223          This result is from an external source.    No results found for: VD25OH  Clinical ASCVD: {YES/NO:21197} The 10-year ASCVD risk score (Arnett DK, et al., 2019) is: 19.5%   Values used to calculate the score:     Age: 21 years     Sex: Male     Is Non-Hispanic African American: No     Diabetic: No     Tobacco smoker: No     Systolic Blood  Pressure: 149 mmHg     Is BP treated: No     HDL Cholesterol: 86.1 mg/dL     Total Cholesterol: 195 mg/dL       09/03/2020    9:28 AM 08/12/2020   10:03 AM 06/27/2020    9:32 AM  Depression screen PHQ 2/9  Decreased Interest 0 0 0  Down, Depressed, Hopeless 0 0 0  PHQ - 2 Score 0 0 0     ***Other: (CHADS2VASc if Afib, MMRC or CAT for COPD, ACT, DEXA)  Social History   Tobacco Use  Smoking Status Former   Types: Cigars  Smokeless Tobacco Never   BP Readings from Last 3 Encounters:  09/03/20 120/76  06/27/20 (!) 160/91  01/14/20 126/68   Pulse Readings from Last 3 Encounters:  09/03/20 68  01/14/20 65  06/15/19 69   Wt Readings from Last 3 Encounters:  10/06/20 215 lb (97.5 kg)  09/03/20 223 lb 3.2 oz (101.2 kg)  01/14/20 225 lb 9.6 oz (102.3 kg)   BMI Readings from Last 3 Encounters:  10/06/20 30.85 kg/m  09/29/20 32.03 kg/m  09/12/20 32.03 kg/m    Assessment/Interventions: Review of patient past medical history, allergies, medications, health status, including review of consultants reports, laboratory and other test data, was performed as part of comprehensive evaluation and provision of chronic care management services.   SDOH:  (Social Determinants of Health) assessments and interventions performed: {yes/no:20286}  SDOH Screenings   Alcohol Screen: Low Risk    Last Alcohol Screening Score (AUDIT): 7  Depression (PHQ2-9): Low Risk    PHQ-2 Score: 0  Financial Resource Strain: Low Risk    Difficulty of Paying Living Expenses: Not hard at all  Food Insecurity: No Food Insecurity   Worried About Charity fundraiser in the Last Year: Never true   Ran Out of Food in the Last Year: Never true  Housing: Low Risk    Last Housing Risk Score: 0  Physical Activity: Sufficiently Active   Days of Exercise per Week: 5 days   Minutes of Exercise per Session: 30 min  Social Connections: Moderately Isolated   Frequency of Communication with Friends and Family: More  than three times a week   Frequency of Social Gatherings with Friends and Family: More than three times a week   Attends Religious Services: Never   Marine scientist or Organizations: No   Attends Archivist Meetings:  Never   Marital Status: Married  Stress: No Stress Concern Present   Feeling of Stress : Not at all  Tobacco Use: Medium Risk   Smoking Tobacco Use: Former   Smokeless Tobacco Use: Never   Passive Exposure: Not on file  Transportation Needs: No Transportation Needs   Lack of Transportation (Medical): No   Lack of Transportation (Non-Medical): No    CCM Care Plan  Allergies  Allergen Reactions   Dilaudid [Hydromorphone Hcl] Nausea And Vomiting   Morphine And Related Nausea And Vomiting   Vancomycin Other (See Comments)    redman syndrome   Penicillins Rash    Patient has only had rash reaction to PCN in the past.  Patient and wife state he Tolerates keflex and  Ancef with no problem.  Has patient had a PCN reaction causing immediate rash, facial/tongue/throat swelling, SOB or lightheadedness with hypotension: Yes- rash only Has patient had a PCN reaction causing severe rash involving mucus membranes or skin necrosis: No Has patient had a PCN reaction that required hospitalization: No Has patient had a PCN reaction occurring within the last 10 years: No    Medications Reviewed Today     Reviewed by Cresenciano Lick, CMA (Certified Medical Assistant) on 10/06/20 at 24  Med List Status: <None>   Medication Order Taking? Sig Documenting Provider Last Dose Status Informant  celecoxib (CELEBREX) 200 MG capsule [id number] No Take 200 mg by mouth at bedtime.  [provider] Taking Active Self  Famotidine (PEPCID PO) [id number] No Take by mouth. [provider] Taking Active   Golimumab [facility] ARIA IV) [id number] No Inject into the vein. [provider] Taking Active   tamsulosin (FLOMAX) 0.4 MG CAPS capsule [id number] No Take  2 capsules (0.8 mg total) by mouth daily. 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   Abnormal x-ray of pelvis 09/11/2020   Disorder of prostate, unspecified  09/11/2020   Snores 09/03/2020   Greater trochanteric pain syndrome of left lower extremity 09/03/2020   Bilateral leg cramps 02/26/2019   Paresthesias 02/26/2019   Need for tetanus, diphtheria, and acellular pertussis (Tdap) vaccine in patient of adolescent age or older 02/26/2019   Spinal stenosis of lumbosacral region 02/26/2019   History of COVID-19 02/26/2019   Paresthesia of both feet 02/26/2019   COVID-19 virus infection 11/03/2018   Viral upper respiratory tract infection 10/30/2018   Presbycusis of both ears 01/10/2018   Excessive cerumen in both ear canals 12/26/2017   Hearing loss 12/26/2017   Skin abnormalities 12/26/2017   Benign prostatic hyperplasia with urinary hesitancy 12/26/2017   Anemia 12/26/2017   Elevated PSA 12/26/2017   Healthcare maintenance 12/21/2017   Elevated BP without diagnosis of hypertension 07/11/2017   Gluteal abscess    Hematoma of hip, right, initial encounter 06/23/2017   Daily consumption of alcohol 06/07/2017   Injury resulting from fall from height 06/07/2017   Intractable low back pain 06/07/2017   Closed fracture of transverse process of lumbar vertebra ([place]) 06/06/2017   Family history of prostate cancer 12/16/2015   Psoriatic arthritis ([place]) 01/19/1999    Immunization History  Administered Date(s) Administered   Fluad Quad(high Dose 65+) 09/09/2015, 09/12/2018   Influenza Inj Mdck Quad Pf 11/01/2017   Influenza Split 11/05/2014   Influenza,inj,quad, With Preservative 09/09/2015  Influenza-Unspecified 09/18/2016, 11/02/2019   PFIZER(Purple Top)SARS-COV-2 Vaccination 02/24/2019, 03/21/2019, 09/03/2019, 05/08/2020   Pneumococcal  Polysaccharide-23 11/05/2014   Tdap 02/26/2019   Zoster Recombinat (Shingrix) 11/28/2019    Conditions to be addressed/monitored:  {USCCMDZASSESSMENTOPTIONS:23563}  There are no care plans that you recently modified to display for this patient.    Medication Assistance: {MEDASSISTANCEINFO:25044}  Compliance/Adherence/Medication fill history: Care Gaps: ***  Star-Rating Drugs: ***  Patient's preferred pharmacy is:  [facility] [id number] - [place], [place] - [address] [place] [address] Phone: [phone] Fax: [phone]  [place], [place] [address] Phone: [phone] Fax: [phone]  Uses pill box? {Yes or If no, why not?:20788} Pt endorses ***% compliance  We discussed: {Pharmacy options:24294} Patient decided to: {US Pharmacy Plan:23885}  Care Plan and Follow Up Patient Decision:  {FOLLOWUP:24991}  Plan: {CM FOLLOW UP PLAN:25073}  ***

## 2021-05-22 DIAGNOSIS — L4050 Arthropathic psoriasis, unspecified: Secondary | ICD-10-CM | POA: Diagnosis not present

## 2021-05-22 DIAGNOSIS — C4441 Basal cell carcinoma of skin of scalp and neck: Secondary | ICD-10-CM | POA: Diagnosis not present

## 2021-05-22 DIAGNOSIS — Z85828 Personal history of other malignant neoplasm of skin: Secondary | ICD-10-CM | POA: Diagnosis not present

## 2021-05-27 DIAGNOSIS — L600 Ingrowing nail: Secondary | ICD-10-CM | POA: Diagnosis not present

## 2021-05-27 DIAGNOSIS — M79671 Pain in right foot: Secondary | ICD-10-CM | POA: Diagnosis not present

## 2021-05-27 DIAGNOSIS — M79672 Pain in left foot: Secondary | ICD-10-CM | POA: Diagnosis not present

## 2021-05-27 DIAGNOSIS — B351 Tinea unguium: Secondary | ICD-10-CM | POA: Diagnosis not present

## 2021-06-04 ENCOUNTER — Ambulatory Visit: Payer: Medicare Other | Admitting: Podiatry

## 2021-06-04 ENCOUNTER — Other Ambulatory Visit: Payer: Self-pay

## 2021-06-04 ENCOUNTER — Encounter: Payer: Self-pay | Admitting: Family Medicine

## 2021-06-04 DIAGNOSIS — Z1211 Encounter for screening for malignant neoplasm of colon: Secondary | ICD-10-CM

## 2021-06-05 ENCOUNTER — Encounter: Payer: Self-pay | Admitting: Podiatry

## 2021-06-05 ENCOUNTER — Ambulatory Visit (INDEPENDENT_AMBULATORY_CARE_PROVIDER_SITE_OTHER): Payer: Medicare Other | Admitting: Podiatry

## 2021-06-05 DIAGNOSIS — B351 Tinea unguium: Secondary | ICD-10-CM | POA: Diagnosis not present

## 2021-06-05 DIAGNOSIS — M79675 Pain in left toe(s): Secondary | ICD-10-CM | POA: Diagnosis not present

## 2021-06-05 DIAGNOSIS — M79674 Pain in right toe(s): Secondary | ICD-10-CM

## 2021-06-05 NOTE — Progress Notes (Signed)
Subjective:   Patient ID: Anthony Greene, male   DOB: 71 y.o.   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

## 2021-06-12 ENCOUNTER — Telehealth: Payer: Self-pay | Admitting: Gastroenterology

## 2021-06-12 NOTE — Telephone Encounter (Signed)
Good Afternoon Dr. Bryan Lemma,  Patient has a referral in to have a colonoscopy. Patient is requesting to start care with you. Patient was last seen for colonoscopy in 2018 with [facility]. Patients records from procedure are in Epic, will you please review and advise on scheduling?  Thank you.

## 2021-06-16 NOTE — Telephone Encounter (Signed)
Patient was scheduled for PV on 6/19 at 10:30  and colon on 7/20 at 9:30.

## 2021-06-16 NOTE — Telephone Encounter (Signed)
Available records reviewed and notable for the following:  Family history notable for brother with colon cancer, diagnosed prior to age 72.  - Colonoscopy (04/16/2011, Dr. Robbie Louis): Lipoma noted ICV, hemorrhoids, diverticulosis - 02/23/2016: Evaluation in the GI clinic in [place] and scheduled for colonoscopy. - Colonoscopy (04/16/2016, Dr. Robbie Louis, [facility]): Normal colon.  Hypertrophied anal papilla.  Repeat in 5 years due to family history  Based on available records and family history, agree that patient is due for repeat colonoscopy for ongoing screening.  If no active GI sxs, ok to schedule as direct access with me for screening colonoscopy. If patient with active GI symptoms or questions/concerns, would like an office appointment first, ok to schedule next available OV with me.

## 2021-06-17 ENCOUNTER — Encounter: Payer: Self-pay | Admitting: Gastroenterology

## 2021-06-17 DIAGNOSIS — Z6832 Body mass index (BMI) 32.0-32.9, adult: Secondary | ICD-10-CM | POA: Diagnosis not present

## 2021-06-17 DIAGNOSIS — M48061 Spinal stenosis, lumbar region without neurogenic claudication: Secondary | ICD-10-CM | POA: Diagnosis not present

## 2021-07-29 DIAGNOSIS — L4050 Arthropathic psoriasis, unspecified: Secondary | ICD-10-CM | POA: Diagnosis not present

## 2021-08-06 ENCOUNTER — Encounter: Payer: Medicare Other | Admitting: Gastroenterology

## 2021-08-10 ENCOUNTER — Ambulatory Visit (AMBULATORY_SURGERY_CENTER): Payer: Self-pay | Admitting: *Deleted

## 2021-08-10 VITALS — Ht 70.0 in | Wt 224.0 lb

## 2021-08-10 DIAGNOSIS — Z800 Family history of malignant neoplasm of digestive organs: Secondary | ICD-10-CM

## 2021-08-10 NOTE — Progress Notes (Signed)
No egg or soy allergy known to patient  No issues known to pt with past sedation with any surgeries or procedures Patient denies ever being told they had issues or difficulty with intubation  No FH of Malignant Hyperthermia Pt is not on diet pills Pt is not on  home 02  Pt is not on blood thinners  Pt denies issues with constipation  No A fib or A flutter  Pt requested Miralax/Gatorade prep  PV completed in person. Pt verified name, DOB.  Procedure explained to pt. Prep instructions reviewed, questions answered. Pt encouraged to call with questions or issues.  If pt has My chart, procedure instructions sent via My Chart

## 2021-08-16 ENCOUNTER — Encounter: Payer: Self-pay | Admitting: Certified Registered Nurse Anesthetist

## 2021-08-18 ENCOUNTER — Ambulatory Visit (INDEPENDENT_AMBULATORY_CARE_PROVIDER_SITE_OTHER): Payer: Medicare Other

## 2021-08-18 DIAGNOSIS — Z0000 Encounter for general adult medical examination without abnormal findings: Secondary | ICD-10-CM

## 2021-08-18 NOTE — Progress Notes (Addendum)
Subjective:   Anthony Greene is a 71 y.o. male who presents for an Subsequent Medicare Annual Wellness Visit.   I connected with [name]  today by telephone and verified that I am speaking with the correct person using two identifiers. Location patient: home Location provider: work Persons participating in the virtual visit: patient, provider.   I discussed the limitations, risks, security and privacy concerns of performing an evaluation and management service by telephone and the availability of in person appointments. I also discussed with the patient that there may be a patient responsible charge related to this service. The patient expressed understanding and verbally consented to this telephonic visit.    Interactive audio and video telecommunications were attempted between this provider and patient, however failed, due to patient having technical difficulties OR patient did not have access to video capability.  We continued and completed visit with audio only.    Review of Systems     Cardiac Risk Factors include: advanced age (>33mn, >>70women);male gender     Objective:    Today's Vitals   There is no height or weight on file to calculate BMI.     08/24/2021    2:19 PM 08/18/2021    9:53 AM 08/12/2020    9:56 AM 04/18/2019    8:53 AM 01/09/2018    8:37 AM 12/21/2017    9:17 AM 06/23/2017    4:12 PM  Advanced Directives  Does Patient Have a Medical Advance Directive? No Yes Yes Yes Yes Yes Yes  Type of ASocial research officer, governmentLiving will HLenaLiving will HMaxwellLiving will HSeminoleLiving will HLeuppLiving will HWinthropLiving will  Does patient want to make changes to medical advance directive? No - Patient declined   No - Patient declined   No - Patient declined  Copy of HBuffaloin Chart?  No - copy requested Yes -  validated most recent copy scanned in chart (See row information) No - copy requested No - copy requested No - copy requested No - copy requested  Would patient like information on creating a medical advance directive?       No - Patient declined    Current Medications (verified) Outpatient Encounter Medications as of 08/18/2021  Medication Sig   celecoxib (CELEBREX) 200 MG capsule Take 200 mg by mouth at bedtime.    Famotidine (PEPCID PO) Take by mouth.   Golimumab (SGaribaldiARIA IV) Inject into the vein.   tamsulosin (FLOMAX) 0.4 MG CAPS capsule TAKE 2 CAPSULES (0.8 MG TOTAL) BY MOUTH DAILY. (NEED APPT BEFORE NEXT REFILL)   No facility-administered encounter medications on file as of 08/18/2021.    Allergies (verified) Dilaudid [hydromorphone hcl], Morphine and related, Vancomycin, and Penicillins   History: Past Medical History:  Diagnosis Date   Arthritis    Closed fracture of transverse process of lumbar vertebra ([facility] 06/06/2017   Hematoma of hip, right, initial encounter 06/23/2017   Injury resulting from fall from height 06/07/2017   Intractable low back pain 06/07/2017   Neuropathy    Osteoarthritis    Pilonidal abscess 06/22/2017   Pilonidal cyst 06/22/2017   Psoriasis    [place]oriatic arthritis ([place]    Sebaceous cyst    Past Surgical History:  Procedure Laterality Date   CARPAL TUNNEL RELEASE Right    COLONOSCOPY WITH PROPOFOL     DRESSING CHANGE UNDER ANESTHESIA N/A 06/25/2017  Procedure: DRESSING CHANGE UNDER ANESTHESIA;  Surgeon: Florene Glen, MD;  Location: [facility] ORS;  Service: General;  Laterality: N/A;   IRRIGATION AND DEBRIDEMENT BUTTOCKS Right 06/23/2017   Procedure: IRRIGATION AND DEBRIDEMENT BUTTOCKS;  Surgeon: Vickie Epley, MD;  Location: [facility] ORS;  Service: General;  Laterality: Right;   JOINT REPLACEMENT     BL knee   KNEE ARTHROSCOPY Right    LAMINOTOMY  2023   PILONIDAL CYST EXCISION N/A 01/20/2018   Procedure: EXCISION OF INCREASINGLY SYMPTOMATIC  PILONIDAL CYST AND RIGHT OF UPPER BACK SEBACEOUS CYST;  Surgeon: Vickie Epley, MD;  Location: [facility] OR[facility];  Service: General;  Laterality: N/A;   TONSILLECTOMY     Family History  Problem Relation Age of Onset   Asthma Mother    COPD Mother    Stroke Father    Heart disease Father    Colon cancer Brother    Cancer Brother    Colon polyps Neg Hx    Esophageal cancer Neg Hx    Stomach cancer Neg Hx    Rectal cancer Neg Hx    Social History   Socioeconomic History   Marital status: Married    Spouse name: Not on file   Number of children: Not on file   Years of education: Not on file   Highest education level: Not on file  Occupational History   Not on file  Tobacco Use   Smoking status: Former    Types: Cigars   Smokeless tobacco: Never  Vaping Use   Vaping Use: Never used  Substance and Sexual Activity   Alcohol use: Yes    Comment: 1-3 alcoholic drinks daily   Drug use: Never   Sexual activity: Yes  Other Topics Concern   Not on file  Social History Narrative   Not on file   Social Determinants of Health   Financial Resource Strain: Low Risk  (08/18/2021)   Overall Financial Resource Strain (CARDIA)    Difficulty of Paying Living Expenses: Not hard at all  Food Insecurity: No Food Insecurity (08/18/2021)   Hunger Vital Sign    Worried About Running Out of Food in the Last Year: Never true    Ran Out of Food in the Last Year: Never true  Transportation Needs: No Transportation Needs (08/18/2021)   PRAPARE - Hydrologist (Medical): No    Lack of Transportation (Non-Medical): No  Physical Activity: Sufficiently Active (08/18/2021)   Exercise Vital Sign    Days of Exercise per Week: 5 days    Minutes of Exercise per Session: 30 min  Stress: No Stress Concern Present (08/18/2021)   [place]    Feeling of Stress : Not at all  Social Connections: Moderately Integrated  (08/18/2021)   Social Connection and Isolation Panel [NHANES]    Frequency of Communication with Friends and Family: Three times a week    Frequency of Social Gatherings with Friends and Family: Three times a week    Attends Religious Services: More than 4 times per year    Active Member of Clubs or Organizations: No    Attends Archivist Meetings: Never    Marital Status: Married    Tobacco Counseling Counseling given: Not Answered   Clinical Intake:  Pre-visit preparation completed: Yes  Pain : No/denies pain     Nutritional Risks: None Diabetes: No  How often do you need to have someone help you when  you read instructions, pamphlets, or other written materials from your doctor or pharmacy?: 1 - Never What is the last grade level you completed in school?: BS  Diabetic?no   Interpreter Needed?: No  Information entered by :: L.Wilson,LPN   Activities of Daily Living    08/18/2021    9:57 AM 08/14/2021    5:26 PM  In your present state of health, do you have any difficulty performing the following activities:  Hearing? 0 0  Vision? 0 0  Difficulty concentrating or making decisions? 0 0  Walking or climbing stairs? 0 0  Dressing or bathing? 0 0  Doing errands, shopping? 0 0  Preparing Food and eating ? N N  Using the Toilet? N N  In the past six months, have you accidently leaked urine? N N  Do you have problems with loss of bowel control? N N  Managing your Medications? N N  Managing your Finances? N N  Housekeeping or managing your Housekeeping? N N    Patient Care Team: Libby Maw, MD as PCP - General (Family Medicine) Germaine Pomfret, [facility] as Pharmacist (Pharmacist)  Indicate any recent Medical Services you may have received from other than [facility] providers in the past year (date may be approximate).     Assessment:   This is a routine wellness examination for [place].  Hearing/Vision screen Vision Screening - Comments:: Annual eye  exams wear glasses]  Dietary issues and exercise activities discussed: Current Exercise Habits: Home exercise routine, Type of exercise: walking, Time (Minutes): 30, Frequency (Times/Week): 3, Weekly Exercise (Minutes/Week): 90, Intensity: Mild, Exercise limited by: None identified   Goals Addressed   None    Depression Screen    09/04/2021    2:43 PM 08/24/2021    2:19 PM 08/18/2021    9:55 AM 08/18/2021    9:52 AM 09/03/2020    9:28 AM 08/12/2020   10:03 AM 06/27/2020    9:32 AM  PHQ 2/9 Scores  PHQ - 2 Score 0 0 0 0 0 0 0    Fall Risk    08/18/2021    9:55 AM 08/14/2021    5:26 PM 09/03/2020    9:28 AM 08/12/2020   10:04 AM 06/27/2020    9:32 AM  Fall Risk   Falls in the past year? 0 0 0 0 0  Number falls in past yr: 0 0  0   Injury with Fall? 0 0  0   Follow up Falls evaluation completed;Education provided   Falls evaluation completed;Falls prevention discussed     FALL RISK PREVENTION PERTAINING TO THE HOME:  Any stairs in or around the home? Yes  If so, are there any without handrails? No  Home free of loose throw rugs in walkways, pet beds, electrical cords, etc? Yes  Adequate lighting in your home to reduce risk of falls? Yes   ASSISTIVE DEVICES UTILIZED TO PREVENT FALLS:  Life alert? No  Use of a cane, walker or w/c? No  Grab bars in the bathroom? Yes  Shower chair or bench in shower? Yes  Elevated toilet seat or a handicapped toilet? Yes     Cognitive Function:    Normal cognitive status assessed by telephone conversation  by this Nurse Health Advisor. No abnormalities found.      08/24/2021    2:20 PM  6CIT Screen  What Year? 0 points  What month? 0 points  What time? 0 points  Count back from 20 0 points  Months in reverse 0 points  Repeat phrase 0 points  Total Score 0 points    Immunizations Immunization History  Administered Date(s) Administered   Fluad Quad(high Dose 65+) 09/09/2015, 09/12/2018   Influenza Inj Mdck Quad Pf 11/01/2017    Influenza Split 11/05/2014   Influenza,inj,quad, With Preservative 09/09/2015   Influenza-Unspecified 09/18/2016, 11/02/2019   PFIZER(Purple Top)SARS-COV-2 Vaccination 02/24/2019, 03/21/2019, 09/03/2019, 05/08/2020   Pneumococcal Polysaccharide-23 11/05/2014   Tdap 02/26/2019   Zoster Recombinat (Shingrix) 11/28/2019    TDAP status: Up to date  Flu Vaccine status: Up to date  Pneumococcal vaccine status: Up to date  Covid-19 vaccine status: Completed vaccines  Qualifies for Shingles Vaccine? Yes   Zostavax completed No   Shingrix Completed?: Yes  Screening Tests Health Maintenance  Topic Date Due   Hepatitis C Screening  Never done   Pneumonia Vaccine 20+ Years old (2 - PCV) 11/05/2015   INFLUENZA VACCINE  08/18/2021   COLONOSCOPY (Pts 45-17yr Insurance coverage will need to be confirmed)  08/24/2024   TETANUS/TDAP  02/25/2029   HPV VACCINES  Aged Out   COVID-19 Vaccine  Discontinued   Zoster Vaccines- Shingrix  Discontinued    Health Maintenance  Health Maintenance Due  Topic Date Due   Hepatitis C Screening  Never done   Pneumonia Vaccine 72 Years old (2 - PCV) 11/05/2015   INFLUENZA VACCINE  08/18/2021    Colorectal cancer screening: Type of screening: Colonoscopy. Completed 04/16/2016. Repeat every 10 years  Lung Cancer Screening: (Low Dose CT Chest recommended if Age 10279-80years, 30 pack-year currently smoking OR have quit w/in 15years.) does not qualify.   Lung Cancer Screening Referral: n/a  Additional Screening:  Hepatitis C Screening: does not qualify;  Vision Screening: Recommended annual ophthalmology exams for early detection of glaucoma and other disorders of the eye. Is the patient up to date with their annual eye exam?  Yes  Who is the provider or what is the name of the office in which the patient attends annual eye exams? [facility]  If pt is not established with a provider, would they like to be referred to a provider to establish care? No .    Dental Screening: Recommended annual dental exams for proper oral hygiene  Community Resource Referral / Chronic Care Management: CRR required this visit?  No   CCM required this visit?  No      Plan:     I have personally reviewed and noted the following in the patient's chart:   Medical and social history Use of alcohol, tobacco or illicit drugs  Current medications and supplements including opioid prescriptions. Patient is not currently taking opioid prescriptions. Functional ability and status Nutritional status Physical activity Advanced directives List of other physicians Hospitalizations, surgeries, and ER visits in previous 12 months Vitals Screenings to include cognitive, depression, and falls Referrals and appointments  In addition, I have reviewed and discussed with patient certain preventive protocols, quality metrics, and best practice recommendations. A written personalized care plan for preventive services as well as general preventive health recommendations were provided to patient.     LDaphane Shepherd LPN   [id number]  Nurse Notes: none     reviewed

## 2021-08-18 NOTE — Patient Instructions (Signed)
Anthony Greene , Thank you for taking time to come for your Medicare Wellness Visit. I appreciate your ongoing commitment to your health goals. Please review the following plan we discussed and let me know if I can assist you in the future.   Screening recommendations/referrals: Colonoscopy: 04/16/2016 Recommended yearly ophthalmology/optometry visit for glaucoma screening and checkup Recommended yearly dental visit for hygiene and checkup  Vaccinations: Influenza vaccine: completed  Pneumococcal vaccine: completed  Tdap vaccine: 02/26/2019 Shingles vaccine: completed     Advanced directives: yes   Conditions/risks identified: none   Next appointment: none   Preventive Care 69 Years and Older, Male Preventive care refers to lifestyle choices and visits with your health care provider that can promote health and wellness. What does preventive care include? A yearly physical exam. This is also called an annual well check. Dental exams once or twice a year. Routine eye exams. Ask your health care provider how often you should have your eyes checked. Personal lifestyle choices, including: Daily care of your teeth and gums. Regular physical activity. Eating a healthy diet. Avoiding tobacco and drug use. Limiting alcohol use. Practicing safe sex. Taking low doses of aspirin every day. Taking vitamin and mineral supplements as recommended by your health care provider. What happens during an annual well check? The services and screenings done by your health care provider during your annual well check will depend on your age, overall health, lifestyle risk factors, and family history of disease. Counseling  Your health care provider may ask you questions about your: Alcohol use. Tobacco use. Drug use. Emotional well-being. Home and relationship well-being. Sexual activity. Eating habits. History of falls. Memory and ability to understand (cognition). Work and work  Statistician. Screening  You may have the following tests or measurements: Height, weight, and BMI. Blood pressure. Lipid and cholesterol levels. These may be checked every 5 years, or more frequently if you are over 18 years old. Skin check. Lung cancer screening. You may have this screening every year starting at age 27 if you have a 30-pack-year history of smoking and currently smoke or have quit within the past 15 years. Fecal occult blood test (FOBT) of the stool. You may have this test every year starting at age 54. Flexible sigmoidoscopy or colonoscopy. You may have a sigmoidoscopy every 5 years or a colonoscopy every 10 years starting at age 60. Prostate cancer screening. Recommendations will vary depending on your family history and other risks. Hepatitis C blood test. Hepatitis B blood test. Sexually transmitted disease (STD) testing. Diabetes screening. This is done by checking your blood sugar (glucose) after you have not eaten for a while (fasting). You may have this done every 1-3 years. Abdominal aortic aneurysm (AAA) screening. You may need this if you are a current or former smoker. Osteoporosis. You may be screened starting at age 3 if you are at high risk. Talk with your health care provider about your test results, treatment options, and if necessary, the need for more tests. Vaccines  Your health care provider may recommend certain vaccines, such as: Influenza vaccine. This is recommended every year. Tetanus, diphtheria, and acellular pertussis (Tdap, Td) vaccine. You may need a Td booster every 10 years. Zoster vaccine. You may need this after age 56. Pneumococcal 13-valent conjugate (PCV13) vaccine. One dose is recommended after age 13. Pneumococcal polysaccharide (PPSV23) vaccine. One dose is recommended after age 66. Talk to your health care provider about which screenings and vaccines you need and how often you need  them. This information is not intended to replace  advice given to you by your health care provider. Make sure you discuss any questions you have with your health care provider. Document Released: 01/31/2015 Document Revised: 09/24/2015 Document Reviewed: 11/05/2014 Elsevier Interactive Patient Education  2017 [place] Prevention in the Home Falls can cause injuries. They can happen to people of all ages. There are many things you can do to make your home safe and to help prevent falls. What can I do on the outside of my home? Regularly fix the edges of walkways and driveways and fix any cracks. Remove anything that might make you trip as you walk through a door, such as a raised step or threshold. Trim any bushes or trees on the path to your home. Use bright outdoor lighting. Clear any walking paths of anything that might make someone trip, such as rocks or tools. Regularly check to see if handrails are loose or broken. Make sure that both sides of any steps have handrails. Any raised decks and porches should have guardrails on the edges. Have any leaves, snow, or ice cleared regularly. Use sand or salt on walking paths during winter. Clean up any spills in your garage right away. This includes oil or grease spills. What can I do in the bathroom? Use night lights. Install grab bars by the toilet and in the tub and shower. Do not use towel bars as grab bars. Use non-skid mats or decals in the tub or shower. If you need to sit down in the shower, use a plastic, non-slip stool. Keep the floor dry. Clean up any water that spills on the floor as soon as it happens. Remove soap buildup in the tub or shower regularly. Attach bath mats securely with double-sided non-slip rug tape. Do not have throw rugs and other things on the floor that can make you trip. What can I do in the bedroom? Use night lights. Make sure that you have a light by your bed that is easy to reach. Do not use any sheets or blankets that are too big for your bed.  They should not hang down onto the floor. Have a firm chair that has side arms. You can use this for support while you get dressed. Do not have throw rugs and other things on the floor that can make you trip. What can I do in the kitchen? Clean up any spills right away. Avoid walking on wet floors. Keep items that you use a lot in easy-to-reach places. If you need to reach something above you, use a strong step stool that has a grab bar. Keep electrical cords out of the way. Do not use floor polish or wax that makes floors slippery. If you must use wax, use non-skid floor wax. Do not have throw rugs and other things on the floor that can make you trip. What can I do with my stairs? Do not leave any items on the stairs. Make sure that there are handrails on both sides of the stairs and use them. Fix handrails that are broken or loose. Make sure that handrails are as long as the stairways. Check any carpeting to make sure that it is firmly attached to the stairs. Fix any carpet that is loose or worn. Avoid having throw rugs at the top or bottom of the stairs. If you do have throw rugs, attach them to the floor with carpet tape. Make sure that you have a light switch at  the top of the stairs and the bottom of the stairs. If you do not have them, ask someone to add them for you. What else can I do to help prevent falls? Wear shoes that: Do not have high heels. Have rubber bottoms. Are comfortable and fit you well. Are closed at the toe. Do not wear sandals. If you use a stepladder: Make sure that it is fully opened. Do not climb a closed stepladder. Make sure that both sides of the stepladder are locked into place. Ask someone to hold it for you, if possible. Clearly mark and make sure that you can see: Any grab bars or handrails. First and last steps. Where the edge of each step is. Use tools that help you move around (mobility aids) if they are needed. These  include: Canes. Walkers. Scooters. Crutches. Turn on the lights when you go into a dark area. Replace any light bulbs as soon as they burn out. Set up your furniture so you have a clear path. Avoid moving your furniture around. If any of your floors are uneven, fix them. If there are any pets around you, be aware of where they are. Review your medicines with your doctor. Some medicines can make you feel dizzy. This can increase your chance of falling. Ask your doctor what other things that you can do to help prevent falls. This information is not intended to replace advice given to you by your health care provider. Make sure you discuss any questions you have with your health care provider. Document Released: 10/31/2008 Document Revised: 06/12/2015 Document Reviewed: 02/08/2014 Elsevier Interactive Patient Education  2017 [employer].

## 2021-08-24 ENCOUNTER — Ambulatory Visit (AMBULATORY_SURGERY_CENTER): Payer: Medicare Other | Admitting: Gastroenterology

## 2021-08-24 ENCOUNTER — Encounter: Payer: Self-pay | Admitting: Gastroenterology

## 2021-08-24 VITALS — BP 139/88 | HR 63 | Temp 97.3°F | Resp 10 | Ht 70.0 in | Wt 224.0 lb

## 2021-08-24 DIAGNOSIS — D120 Benign neoplasm of cecum: Secondary | ICD-10-CM | POA: Diagnosis not present

## 2021-08-24 DIAGNOSIS — Z1211 Encounter for screening for malignant neoplasm of colon: Secondary | ICD-10-CM | POA: Diagnosis not present

## 2021-08-24 DIAGNOSIS — D124 Benign neoplasm of descending colon: Secondary | ICD-10-CM

## 2021-08-24 DIAGNOSIS — D122 Benign neoplasm of ascending colon: Secondary | ICD-10-CM | POA: Diagnosis not present

## 2021-08-24 DIAGNOSIS — D123 Benign neoplasm of transverse colon: Secondary | ICD-10-CM

## 2021-08-24 DIAGNOSIS — Z800 Family history of malignant neoplasm of digestive organs: Secondary | ICD-10-CM | POA: Diagnosis not present

## 2021-08-24 DIAGNOSIS — D125 Benign neoplasm of sigmoid colon: Secondary | ICD-10-CM

## 2021-08-24 DIAGNOSIS — K5730 Diverticulosis of large intestine without perforation or abscess without bleeding: Secondary | ICD-10-CM

## 2021-08-24 MED ORDER — SODIUM CHLORIDE 0.9 % IV SOLN: 500.0000 mL | Freq: Once | INTRAVENOUS | Status: DC

## 2021-08-24 NOTE — Progress Notes (Signed)
Called to room to assist during endoscopic procedure.  Patient ID and intended procedure confirmed with present staff. Received instructions for my participation in the procedure from the performing physician.  

## 2021-08-24 NOTE — Progress Notes (Signed)
Pt's states no medical or surgical changes since previsit or office visit. 

## 2021-08-24 NOTE — Progress Notes (Signed)
Report given to PACU, vss 

## 2021-08-24 NOTE — Progress Notes (Signed)
0822 Patient experiencing nausea and retching.  Zofran 4 mg IV given. Robinul 0.2 mg IV given due large amount of secretions upon assessment.  MD made aware, vss

## 2021-08-24 NOTE — Op Note (Signed)
[place] Patient Name: Anthony Greene Procedure Date: 08/24/2021 8:01 AM MRN: [id number] Endoscopist: Gerrit Heck , MD Age: 71 Referring MD:  Date of Birth: 1950-05-09 Gender: Male Account #: [id number] Procedure:                Colonoscopy Indications:              Screening in patient at increased risk: Colorectal                            cancer in brother before age 50                           - Colonoscopy (04/16/2011, Dr. Robbie Louis): Lipoma                            noted ICV, hemorrhoids, diverticulosis                           - Colonoscopy (04/16/2016, Dr. Robbie Louis, [facility]): Normal colon. Hypertrophied anal papilla.                            Repeat in 5 years due to family history Medicines:                Monitored Anesthesia Care Procedure:                Pre-Anesthesia Assessment:                           - Prior to the procedure, a History and Physical                            was performed, and patient medications and                            allergies were reviewed. The patient's tolerance of                            previous anesthesia was also reviewed. The risks                            and benefits of the procedure and the sedation                            options and risks were discussed with the patient.                            All questions were answered, and informed consent                            was obtained. Prior Anticoagulants: The patient has  taken no previous anticoagulant or antiplatelet                            agents. ASA Grade Assessment: II - A patient with                            mild systemic disease. After reviewing the risks                            and benefits, the patient was deemed in                            satisfactory condition to undergo the procedure.                           After obtaining informed consent, the colonoscope                             was passed under direct vision. Throughout the                            procedure, the patient's blood pressure, pulse, and                            oxygen saturations were monitored continuously. The                            Olympus CF-HQ190L [id number]) Colonoscope was                            introduced through the anus and advanced to the the                            cecum, identified by appendiceal orifice and                            ileocecal valve. The colonoscopy was performed                            without difficulty. The patient tolerated the                            procedure well. The quality of the bowel                            preparation was good. The ileocecal valve,                            appendiceal orifice, and rectum were photographed. Scope In: 8:04:46 AM Scope Out: 8:27:19 AM Scope Withdrawal Time: 0 hours 20 minutes 46 seconds  Total Procedure Duration: 0 hours 22 minutes 33 seconds  Findings:                 The perianal and digital rectal examinations were  normal.                           Six sessile polyps were found in the descending                            colon (1), transverse colon (3), ascending colon                            (1), and cecum (1). The polyps were 2 to 5 mm in                            size. These polyps were removed with a cold snare.                            Resection and retrieval were complete. Estimated                            blood loss was minimal.                           A 6 mm polyp was found in the sigmoid colon. The                            polyp was sessile. The polyp was removed with a                            cold snare. Resection and retrieval were complete.                            Estimated blood loss was minimal.                           There was a small lipoma, in the transverse colon.                           A few small-mouthed  diverticula were found in the                            sigmoid colon.                           The retroflexed view of the distal rectum and anal                            verge was normal and showed no anal or rectal                            abnormalities. Complications:            No immediate complications. Estimated Blood Loss:     Estimated blood loss was minimal. Impression:               - Six 2 to 5 mm polyps in the descending colon, in  the transverse colon, in the ascending colon and in                            the cecum, removed with a cold snare. Resected and                            retrieved.                           - One 6 mm polyp in the sigmoid colon, removed with                            a cold snare. Resected and retrieved.                           - Small lipoma in the transverse colon.                           - Diverticulosis in the sigmoid colon.                           - The distal rectum and anal verge are normal on                            retroflexion view. Recommendation:           - Patient has a contact number available for                            emergencies. The signs and symptoms of potential                            delayed complications were discussed with the                            patient. Return to normal activities tomorrow.                            Written discharge instructions were provided to the                            patient.                           - Resume previous diet.                           - Continue present medications.                           - Await pathology results.                           - Repeat colonoscopy in 3 - 5 years for  surveillance based on pathology results and family                            history.                           - Return to GI office PRN. Gerrit Heck, MD 08/24/2021 8:33:20 AM

## 2021-08-24 NOTE — Progress Notes (Signed)
GASTROENTEROLOGY PROCEDURE H&P NOTE   Primary Care Physician: Libby Maw, MD    Reason for Procedure:  Colon Cancer screening, family history of colon cancer  Plan:    Colonoscopy  Patient is appropriate for endoscopic procedure(s) in the ambulatory ([place]) setting.  The nature of the procedure, as well as the risks, benefits, and alternatives were carefully and thoroughly reviewed with the patient. Ample time for discussion and questions allowed. The patient understood, was satisfied, and agreed to proceed.     HPI: Anthony Greene is a 71 y.o. male who presents for colonoscopy for continued Colon Cancer screening.  No active GI symptoms.  Family history notable for brother diagnosed with colon cancer prior to age 42.  Endoscopic History: - Colonoscopy (04/16/2011, Dr. Robbie Louis): Lipoma noted ICV, hemorrhoids, diverticulosis - 02/23/2016: Evaluation in the GI clinic in [place] and scheduled for colonoscopy. - Colonoscopy (04/16/2016, Dr. Robbie Louis, [facility]): Normal colon.  Hypertrophied anal papilla.  Repeat in 5 years due to family history  Past Medical History:  Diagnosis Date   Arthritis    Closed fracture of transverse process of lumbar vertebra ([place]) 06/06/2017   Hematoma of hip, right, initial encounter 06/23/2017   Injury resulting from fall from height 06/07/2017   Intractable low back pain 06/07/2017   Neuropathy    Osteoarthritis    Pilonidal abscess 06/22/2017   Pilonidal cyst 06/22/2017   Psoriasis    Psoriatic arthritis ([place])    Sebaceous cyst     Past Surgical History:  Procedure Laterality Date   CARPAL TUNNEL RELEASE Right    COLONOSCOPY WITH PROPOFOL     DRESSING CHANGE UNDER ANESTHESIA N/A 06/25/2017   Procedure: DRESSING CHANGE UNDER ANESTHESIA;  Surgeon: Florene Glen, MD;  Location: [facility] ORS;  Service: General;  Laterality: N/A;   IRRIGATION AND DEBRIDEMENT BUTTOCKS Right 06/23/2017   Procedure: IRRIGATION AND DEBRIDEMENT BUTTOCKS;   Surgeon: Vickie Epley, MD;  Location: [facility] ORS;  Service: General;  Laterality: Right;   JOINT REPLACEMENT     BL knee   KNEE ARTHROSCOPY Right    LAMINOTOMY  2023   PILONIDAL CYST EXCISION N/A 01/20/2018   Procedure: EXCISION OF INCREASINGLY SYMPTOMATIC PILONIDAL CYST AND RIGHT OF UPPER BACK SEBACEOUS CYST;  Surgeon: Vickie Epley, MD;  Location: [facility] ORS;  Service: General;  Laterality: N/A;   TONSILLECTOMY      Prior to Admission medications   Medication Sig Start Date End Date Taking? Authorizing Provider  Famotidine (PEPCID PO) Take by mouth.   Yes [provider]  tamsulosin (FLOMAX) 0.4 MG CAPS capsule TAKE 2 CAPSULES (0.8 MG TOTAL) BY MOUTH DAILY. (NEED APPT BEFORE NEXT REFILL) 01/28/21  Yes Libby Maw, MD  celecoxib (CELEBREX) 200 MG capsule Take 200 mg by mouth at bedtime.     [provider]  Golimumab ([place] ARIA IV) Inject into the vein.    [provider]    Current Outpatient Medications  Medication Sig Dispense Refill   Famotidine (PEPCID PO) Take by mouth.     tamsulosin (FLOMAX) 0.4 MG CAPS capsule TAKE 2 CAPSULES (0.8 MG TOTAL) BY MOUTH DAILY. (NEED APPT BEFORE NEXT REFILL) 180 capsule 1   celecoxib (CELEBREX) 200 MG capsule Take 200 mg by mouth at bedtime.      Golimumab ([place] ARIA IV) Inject into the vein.     Current Facility-Administered Medications  Medication Dose Route Frequency Provider Last Rate Last Admin   0.9 %  sodium chloride infusion  500  mL Intravenous Once Joshue Badal V, DO        Allergies as of 08/24/2021 - Review Complete 08/24/2021  Allergen Reaction Noted   Dilaudid [hydromorphone hcl] Nausea And Vomiting 01/09/2018   Morphine and related Nausea And Vomiting 01/09/2018   Vancomycin Other (See Comments) 06/19/2017   Penicillins Rash 06/19/2017    Family History  Problem Relation Age of Onset   Asthma Mother    COPD Mother    Stroke Father    Heart disease Father    Colon cancer  Brother    Cancer Brother    Colon polyps Neg Hx    Esophageal cancer Neg Hx    Stomach cancer Neg Hx    Rectal cancer Neg Hx     Social History   Socioeconomic History   Marital status: Married    Spouse name: Not on file   Number of children: Not on file   Years of education: Not on file   Highest education level: Not on file  Occupational History   Not on file  Tobacco Use   Smoking status: Former    Types: Cigars   Smokeless tobacco: Never  Vaping Use   Vaping Use: Never used  Substance and Sexual Activity   Alcohol use: Yes    Comment: 1-3 alcoholic drinks daily   Drug use: Never   Sexual activity: Yes  Other Topics Concern   Not on file  Social History Narrative   Not on file   Social Determinants of Health   Financial Resource Strain: Low Risk  (08/18/2021)   Overall Financial Resource Strain (CARDIA)    Difficulty of Paying Living Expenses: Not hard at all  Food Insecurity: No Food Insecurity (08/18/2021)   Hunger Vital Sign    Worried About Running Out of Food in the Last Year: Never true    Ran Out of Food in the Last Year: Never true  Transportation Needs: No Transportation Needs (08/18/2021)   PRAPARE - Hydrologist (Medical): No    Lack of Transportation (Non-Medical): No  Physical Activity: Sufficiently Active (08/18/2021)   Exercise Vital Sign    Days of Exercise per Week: 5 days    Minutes of Exercise per Session: 30 min  Stress: No Stress Concern Present (08/18/2021)   [place]    Feeling of Stress : Not at all  Social Connections: Moderately Integrated (08/18/2021)   Social Connection and Isolation Panel [NHANES]    Frequency of Communication with Friends and Family: Three times a week    Frequency of Social Gatherings with Friends and Family: Three times a week    Attends Religious Services: More than 4 times per year    Active Member of Clubs or  Organizations: No    Attends Archivist Meetings: Never    Marital Status: Married  Human resources officer Violence: Not At Risk (08/18/2021)   Humiliation, Afraid, Rape, and Kick questionnaire    Fear of Current or Ex-Partner: No    Emotionally Abused: No    Physically Abused: No    Sexually Abused: No    Physical Exam: Vital signs in last 24 hours: '@BP'$  137/74   Pulse 62   Temp (!) 97.3 F (36.3 C)   Ht '5\' 10"'$  (1.778 m)   Wt 224 lb (101.6 kg)   SpO2 96%   BMI 32.14 kg/m  GEN: NAD EYE: Sclerae anicteric ENT: MMM CV: Non-tachycardic Pulm: CTA  b/l GI: Soft, NT/ND NEURO:  Alert & Oriented x 3   Gerrit Heck, DO [facility]   08/24/2021 7:50 AM

## 2021-08-24 NOTE — Patient Instructions (Signed)
Handouts on polyps and diverticulosis given to you today  Await pathology results from Dr. Bryan Lemma   YOU HAD AN ENDOSCOPIC PROCEDURE TODAY AT THE [facility]:   Refer to the procedure report that was given to you for any specific questions about what was found during the examination.  If the procedure report does not answer your questions, please call your gastroenterologist to clarify.  If you requested that your care partner not be given the details of your procedure findings, then the procedure report has been included in a sealed envelope for you to review at your convenience later.  YOU SHOULD EXPECT: Some feelings of bloating in the abdomen. Passage of more gas than usual.  Walking can help get rid of the air that was put into your GI tract during the procedure and reduce the bloating. If you had a lower endoscopy (such as a colonoscopy or flexible sigmoidoscopy) you may notice spotting of blood in your stool or on the toilet paper. If you underwent a bowel prep for your procedure, you may not have a normal bowel movement for a few days.  Please Note:  You might notice some irritation and congestion in your nose or some drainage.  This is from the oxygen used during your procedure.  There is no need for concern and it should clear up in a day or so.  SYMPTOMS TO REPORT IMMEDIATELY:  Following lower endoscopy (colonoscopy or flexible sigmoidoscopy):  Excessive amounts of blood in the stool  Significant tenderness or worsening of abdominal pains  Swelling of the abdomen that is new, acute  Fever of 100F or higher  For urgent or emergent issues, a gastroenterologist can be reached at any hour by calling [phone]. Do not use MyChart messaging for urgent concerns.    DIET:  We do recommend a small meal at first, but then you may proceed to your regular diet.  Drink plenty of fluids but you should avoid alcoholic beverages for 24 hours.  ACTIVITY:  You should plan to  take it easy for the rest of today and you should NOT DRIVE or use heavy machinery until tomorrow (because of the sedation medicines used during the test).    FOLLOW UP: Our staff will call the number listed on your records the next business day following your procedure.  We will call around 7:15- 8:00 am to check on you and address any questions or concerns that you may have regarding the information given to you following your procedure. If we do not reach you, we will leave a message.  If you develop any symptoms (ie: fever, flu-like symptoms, shortness of breath, cough etc.) before then, please call [phone].  If you test positive for Covid 19 in the 2 weeks post procedure, please call and report this information to [place].    If any biopsies were taken you will be contacted by phone or by letter within the next 1-3 weeks.  Please call us at [phone] if you have not heard about the biopsies in 3 weeks.    SIGNATURES/CONFIDENTIALITY: You and/or your care partner have signed paperwork which will be entered into your electronic medical record.  These signatures attest to the fact that that the information above on your After Visit Summary has been reviewed and is understood.  Full responsibility of the confidentiality of this discharge information lies with you and/or your care-partner.

## 2021-08-25 ENCOUNTER — Telehealth: Payer: Self-pay

## 2021-08-25 NOTE — Telephone Encounter (Signed)
  Follow up Call-     08/24/2021    7:21 AM  Call back number  Post procedure Call Back phone  # [phone]  Permission to leave phone message Yes     Patient questions:  Do you have a fever, pain , or abdominal swelling? No. Pain Score  0 *  Have you tolerated food without any problems? Yes.    Have you been able to return to your normal activities? Yes.    Do you have any questions about your discharge instructions: Diet   No. Medications  No. Follow up visit  No.  Do you have questions or concerns about your Care? No.  Actions: * If pain score is 4 or above: No action needed, pain <4.

## 2021-08-28 ENCOUNTER — Ambulatory Visit: Payer: Medicare Other | Admitting: Family Medicine

## 2021-09-01 ENCOUNTER — Encounter: Payer: Self-pay | Admitting: Gastroenterology

## 2021-09-11 ENCOUNTER — Other Ambulatory Visit: Payer: Self-pay | Admitting: Family Medicine

## 2021-09-11 DIAGNOSIS — N401 Enlarged prostate with lower urinary tract symptoms: Secondary | ICD-10-CM

## 2021-09-16 DIAGNOSIS — L409 Psoriasis, unspecified: Secondary | ICD-10-CM | POA: Diagnosis not present

## 2021-09-16 DIAGNOSIS — E669 Obesity, unspecified: Secondary | ICD-10-CM | POA: Diagnosis not present

## 2021-09-16 DIAGNOSIS — M1991 Primary osteoarthritis, unspecified site: Secondary | ICD-10-CM | POA: Diagnosis not present

## 2021-09-16 DIAGNOSIS — Z79899 Other long term (current) drug therapy: Secondary | ICD-10-CM | POA: Diagnosis not present

## 2021-09-16 DIAGNOSIS — L4050 Arthropathic psoriasis, unspecified: Secondary | ICD-10-CM | POA: Diagnosis not present

## 2021-09-16 DIAGNOSIS — Z6831 Body mass index (BMI) 31.0-31.9, adult: Secondary | ICD-10-CM | POA: Diagnosis not present

## 2021-09-22 ENCOUNTER — Ambulatory Visit (INDEPENDENT_AMBULATORY_CARE_PROVIDER_SITE_OTHER): Payer: Medicare Other | Admitting: Family Medicine

## 2021-09-22 ENCOUNTER — Ambulatory Visit: Payer: Medicare Other | Admitting: Family Medicine

## 2021-09-22 ENCOUNTER — Encounter: Payer: Self-pay | Admitting: Family Medicine

## 2021-09-22 VITALS — BP 124/68 | HR 72 | Temp 97.5°F | Ht 70.0 in | Wt 219.6 lb

## 2021-09-22 DIAGNOSIS — N401 Enlarged prostate with lower urinary tract symptoms: Secondary | ICD-10-CM

## 2021-09-22 DIAGNOSIS — Z789 Other specified health status: Secondary | ICD-10-CM

## 2021-09-22 DIAGNOSIS — R0683 Snoring: Secondary | ICD-10-CM

## 2021-09-22 DIAGNOSIS — R351 Nocturia: Secondary | ICD-10-CM

## 2021-09-22 DIAGNOSIS — Z0000 Encounter for general adult medical examination without abnormal findings: Secondary | ICD-10-CM

## 2021-09-22 DIAGNOSIS — K219 Gastro-esophageal reflux disease without esophagitis: Secondary | ICD-10-CM | POA: Diagnosis not present

## 2021-09-22 MED ORDER — OMEPRAZOLE 40 MG PO CPDR: 40.0000 mg | DELAYED_RELEASE_CAPSULE | Freq: Every day | ORAL | 3 refills | Status: DC

## 2021-09-22 NOTE — Progress Notes (Signed)
Established Patient Office Visit  Subjective   Patient ID: Anthony Greene, male    DOB: Jul 04, 1950  Age: 71 y.o. MRN: [id number]  Chief Complaint  Patient presents with   Follow-up    Routine follow up would like re-referral for sleep study    HPI for health check and follow-up of BPH, snoring and GERD.  Status post recent colonoscopy 2 weeks ago.  Tamsulosin is helping urine flow.  Still experiences nocturia on occasion has not been fluid restricting 2 hours prior to bedtime.  Taking famotidine for GERD symptoms.  Often needs the additional multiple times.    Review of Systems  Constitutional: Negative.   HENT: Negative.    Eyes:  Negative for blurred vision, discharge and redness.  Respiratory: Negative.    Cardiovascular: Negative.   Gastrointestinal:  Negative for abdominal pain.  Genitourinary: Negative.   Musculoskeletal: Negative.  Negative for myalgias.  Skin:  Negative for rash.  Neurological:  Negative for tingling, loss of consciousness and weakness.  Endo/Heme/Allergies:  Negative for polydipsia.      Objective:     BP 124/68 (BP Location: Right Arm, Patient Position: Sitting, Cuff Size: Normal)   Pulse 72   Temp (!) 97.5 F (36.4 C) (Temporal)   Ht '5\' 10"'$  (1.778 m)   Wt 219 lb 9.6 oz (99.6 kg)   SpO2 96%   BMI 31.51 kg/m    Physical Exam Constitutional:      General: He is not in acute distress.    Appearance: Normal appearance. He is not ill-appearing, toxic-appearing or diaphoretic.  HENT:     Head: Normocephalic and atraumatic.     Right Ear: External ear normal.     Left Ear: External ear normal.     Mouth/Throat:     Mouth: Mucous membranes are moist.     Pharynx: Oropharynx is clear. No oropharyngeal exudate or posterior oropharyngeal erythema.   Eyes:     General: No scleral icterus.       Right eye: No discharge.        Left eye: No discharge.     Extraocular Movements: Extraocular movements intact.     Conjunctiva/sclera:  Conjunctivae normal.     Pupils: Pupils are equal, round, and reactive to light.  Cardiovascular:     Rate and Rhythm: Normal rate and regular rhythm.  Pulmonary:     Effort: Pulmonary effort is normal. No respiratory distress.     Breath sounds: Normal breath sounds.  Abdominal:     General: Bowel sounds are normal.  Musculoskeletal:     Cervical back: No rigidity or tenderness.  Skin:    General: Skin is warm and dry.  Neurological:     Mental Status: He is alert and oriented to person, place, and time.  Psychiatric:        Mood and Affect: Mood normal.        Behavior: Behavior normal.      No results found for any visits on 09/22/21.    The 10-year ASCVD risk score (Arnett DK, et al., 2019) is: 15.7%    Assessment & Plan:   Problem List Items Addressed This Visit       Digestive   Gastroesophageal reflux disease - Primary   Relevant Medications   omeprazole (PRILOSEC) 40 MG capsule   Other Relevant Orders   CBC     Other   Daily consumption of alcohol   Relevant Orders   Comprehensive metabolic panel   Healthcare  maintenance   Relevant Orders   Lipid panel   Urinalysis, Routine w reflex microscopic   Benign prostatic hyperplasia with nocturia   Relevant Orders   PSA   Snores   Relevant Orders   Ambulatory referral to Sleep Studies    Return in about 3 months (around 12/22/2021), or Return fasting in the morning for blood work..   We will change to omeprazole.  Will return fasting tomorrow for blood work. Libby Maw, MD

## 2021-09-23 ENCOUNTER — Other Ambulatory Visit (INDEPENDENT_AMBULATORY_CARE_PROVIDER_SITE_OTHER): Payer: Medicare Other

## 2021-09-23 DIAGNOSIS — R351 Nocturia: Secondary | ICD-10-CM | POA: Diagnosis not present

## 2021-09-23 DIAGNOSIS — N401 Enlarged prostate with lower urinary tract symptoms: Secondary | ICD-10-CM

## 2021-09-23 DIAGNOSIS — K219 Gastro-esophageal reflux disease without esophagitis: Secondary | ICD-10-CM

## 2021-09-23 DIAGNOSIS — L4050 Arthropathic psoriasis, unspecified: Secondary | ICD-10-CM | POA: Diagnosis not present

## 2021-09-23 DIAGNOSIS — Z0000 Encounter for general adult medical examination without abnormal findings: Secondary | ICD-10-CM | POA: Diagnosis not present

## 2021-09-23 DIAGNOSIS — Z789 Other specified health status: Secondary | ICD-10-CM | POA: Diagnosis not present

## 2021-09-23 LAB — COMPREHENSIVE METABOLIC PANEL
ALT: 17 U/L (ref 0–53)
AST: 17 U/L (ref 0–37)
Albumin: 4 g/dL (ref 3.5–5.2)
Alkaline Phosphatase: 42 U/L (ref 39–117)
BUN: 18 mg/dL (ref 6–23)
CO2: 26 mEq/L (ref 19–32)
Calcium: 9.9 mg/dL (ref 8.4–10.5)
Chloride: 104 mEq/L (ref 96–112)
Creatinine, Ser: 0.96 mg/dL (ref 0.40–1.50)
GFR: 79.17 mL/min (ref >60.00)
Glucose, Bld: 94 mg/dL (ref 70–99)
Potassium: 4.5 mEq/L (ref 3.5–5.1)
Sodium: 138 mEq/L (ref 135–145)
Total Bilirubin: 0.5 mg/dL (ref 0.2–1.2)
Total Protein: 6.5 g/dL (ref 6.0–8.3)

## 2021-09-23 LAB — URINALYSIS, ROUTINE W REFLEX MICROSCOPIC
Bilirubin Urine: NEGATIVE
Hgb urine dipstick: NEGATIVE
Ketones, ur: NEGATIVE
Leukocytes,Ua: NEGATIVE
Nitrite: NEGATIVE
RBC / HPF: NONE SEEN (ref 0–?)
Specific Gravity, Urine: 1.02 (ref 1.000–1.030)
Total Protein, Urine: NEGATIVE
Urine Glucose: NEGATIVE
Urobilinogen, UA: 0.2 (ref 0.0–1.0)
pH: 6.5 (ref 5.0–8.0)

## 2021-09-23 LAB — LIPID PANEL
Cholesterol: 189 mg/dL (ref 0–200)
HDL: 70 mg/dL (ref >39.00)
LDL Cholesterol: 109 mg/dL — ABNORMAL HIGH (ref 0–99)
NonHDL: 118.88
Total CHOL/HDL Ratio: 3
Triglycerides: 51 mg/dL (ref 0.0–149.0)
VLDL: 10.2 mg/dL (ref 0.0–40.0)

## 2021-09-23 LAB — CBC
HCT: 45.1 % (ref 39.0–52.0)
Hemoglobin: 15.1 g/dL (ref 13.0–17.0)
MCHC: 33.6 g/dL (ref 30.0–36.0)
MCV: 93.3 fl (ref 78.0–100.0)
Platelets: 202 10*3/uL (ref 150.0–400.0)
RBC: 4.83 Mil/uL (ref 4.22–5.81)
RDW: 13.4 % (ref 11.5–15.5)
WBC: 4.2 10*3/uL (ref 4.0–10.5)

## 2021-09-23 LAB — PSA: PSA: 5.28 ng/mL — ABNORMAL HIGH (ref 0.10–4.00)

## 2021-09-27 DIAGNOSIS — Z23 Encounter for immunization: Secondary | ICD-10-CM | POA: Diagnosis not present

## 2021-10-12 DIAGNOSIS — Z23 Encounter for immunization: Secondary | ICD-10-CM | POA: Diagnosis not present

## 2021-10-27 DIAGNOSIS — J019 Acute sinusitis, unspecified: Secondary | ICD-10-CM | POA: Diagnosis not present

## 2021-11-11 DIAGNOSIS — H109 Unspecified conjunctivitis: Secondary | ICD-10-CM | POA: Diagnosis not present

## 2021-11-11 DIAGNOSIS — J029 Acute pharyngitis, unspecified: Secondary | ICD-10-CM | POA: Diagnosis not present

## 2021-11-11 DIAGNOSIS — R051 Acute cough: Secondary | ICD-10-CM | POA: Diagnosis not present

## 2021-11-14 DIAGNOSIS — J069 Acute upper respiratory infection, unspecified: Secondary | ICD-10-CM | POA: Diagnosis not present

## 2021-11-14 DIAGNOSIS — J029 Acute pharyngitis, unspecified: Secondary | ICD-10-CM | POA: Diagnosis not present

## 2021-11-18 DIAGNOSIS — L4050 Arthropathic psoriasis, unspecified: Secondary | ICD-10-CM | POA: Diagnosis not present

## 2021-11-18 DIAGNOSIS — H66002 Acute suppurative otitis media without spontaneous rupture of ear drum, left ear: Secondary | ICD-10-CM | POA: Diagnosis not present

## 2021-11-18 DIAGNOSIS — Z79899 Other long term (current) drug therapy: Secondary | ICD-10-CM | POA: Diagnosis not present

## 2021-11-18 DIAGNOSIS — R5383 Other fatigue: Secondary | ICD-10-CM | POA: Diagnosis not present

## 2021-11-18 DIAGNOSIS — H6121 Impacted cerumen, right ear: Secondary | ICD-10-CM | POA: Diagnosis not present

## 2021-11-23 ENCOUNTER — Ambulatory Visit (INDEPENDENT_AMBULATORY_CARE_PROVIDER_SITE_OTHER): Payer: Medicare Other | Admitting: Family Medicine

## 2021-11-23 ENCOUNTER — Encounter: Payer: Self-pay | Admitting: Family Medicine

## 2021-11-23 VITALS — BP 120/82 | HR 66 | Temp 97.5°F | Ht 70.0 in | Wt 220.8 lb

## 2021-11-23 DIAGNOSIS — R972 Elevated prostate specific antigen [PSA]: Secondary | ICD-10-CM

## 2021-11-23 DIAGNOSIS — E7800 Pure hypercholesterolemia, unspecified: Secondary | ICD-10-CM

## 2021-11-23 DIAGNOSIS — H6993 Unspecified Eustachian tube disorder, bilateral: Secondary | ICD-10-CM

## 2021-11-23 MED ORDER — PREDNISONE 20 MG PO TABS: 20.0000 mg | ORAL_TABLET | Freq: Two times a day (BID) | ORAL | 0 refills | Status: AC

## 2021-11-23 MED ORDER — MOMETASONE FUROATE 50 MCG/ACT NA SUSP: 2.0000 | Freq: Every day | NASAL | 12 refills | Status: DC

## 2021-11-23 NOTE — Progress Notes (Signed)
Established Patient Office Visit  Subjective   Patient ID: Anthony Greene, male    DOB: [birth year]  Age: 71 y.o. MRN: [id number]  Chief Complaint  Patient presents with   Acute Visit    Pt states having ear infection x 2 weeks talking cefuroxime axetil for symptoms    HPI bilateral air can gesturing.  Recent sinus infection treated with an antibiotic 1 month ago.  Stuffy ears were treated with another antibiotic 2 weeks ago.  Ear congestion remains.  He denies ongoing postnasal drip sneezing itchy watery eyes ears nose or throat.  There have been no fevers or chills.  Some decrease in force of urine stream.  BPH symptoms are not that pronounced he feels.    ROS    Objective:     BP 120/82 (BP Location: Right Arm, Patient Position: Sitting, Cuff Size: Normal)   Pulse 66   Temp (!) 97.5 F (36.4 C) (Temporal)   Ht '5\' 10"'$  (1.778 m)   Wt 220 lb 12.8 oz (100.2 kg)   SpO2 96%   BMI 31.68 kg/m    Physical Exam Constitutional:      General: He is not in acute distress.    Appearance: Normal appearance. He is not ill-appearing, toxic-appearing or diaphoretic.  HENT:     Head: Normocephalic and atraumatic.     Right Ear: External ear normal. Tympanic membrane is retracted. Tympanic membrane is not erythematous.     Left Ear: External ear normal. Tympanic membrane is retracted. Tympanic membrane is not erythematous.     Mouth/Throat:     Mouth: Mucous membranes are moist.     Pharynx: Oropharynx is clear. No oropharyngeal exudate or posterior oropharyngeal erythema.  Eyes:     General: No scleral icterus.       Right eye: No discharge.        Left eye: No discharge.     Extraocular Movements: Extraocular movements intact.     Conjunctiva/sclera: Conjunctivae normal.     Pupils: Pupils are equal, round, and reactive to light.  Cardiovascular:     Rate and Rhythm: Normal rate and regular rhythm.  Pulmonary:     Effort: Pulmonary effort is normal. No respiratory distress.      Breath sounds: Normal breath sounds.  Musculoskeletal:     Cervical back: No rigidity or tenderness.  Lymphadenopathy:     Cervical: No cervical adenopathy.  Skin:    General: Skin is warm and dry.  Neurological:     Mental Status: He is alert and oriented to person, place, and time.  Psychiatric:        Mood and Affect: Mood normal.        Behavior: Behavior normal.      No results found for any visits on 11/23/21.    The 10-year ASCVD risk score (Arnett DK, et al., 2019) is: 15.9%    Assessment & Plan:   Problem List Items Addressed This Visit       Nervous and Auditory   Dysfunction of both eustachian tubes   Relevant Medications   predniSONE (DELTASONE) 20 MG tablet   mometasone (NASONEX) 50 MCG/ACT nasal spray     Other   Elevated PSA - Primary   Elevated LDL cholesterol level    Return in about 3 months (around 02/23/2022), or if symptoms worsen or fail to improve.  Return in 3 months for recheck of elevated PSA.  We will continue to monitor BPH symptoms.  Prednisone with Nasonex  for bilateral ETD.  He will perform ETD exercises 3 times daily.  Information was given on eustachian tube dysfunction.  Discussed starting a statin to mitigate his elevated ASCVD risk.  He declines.  HDL is 70.  Libby Maw, MD

## 2021-12-07 ENCOUNTER — Encounter: Payer: Self-pay | Admitting: Neurology

## 2021-12-07 ENCOUNTER — Other Ambulatory Visit: Payer: Self-pay | Admitting: Family Medicine

## 2021-12-07 ENCOUNTER — Ambulatory Visit (INDEPENDENT_AMBULATORY_CARE_PROVIDER_SITE_OTHER): Payer: Medicare Other | Admitting: Neurology

## 2021-12-07 VITALS — BP 146/77 | HR 60 | Ht 70.0 in | Wt 221.0 lb

## 2021-12-07 DIAGNOSIS — M48062 Spinal stenosis, lumbar region with neurogenic claudication: Secondary | ICD-10-CM | POA: Diagnosis not present

## 2021-12-07 DIAGNOSIS — R0683 Snoring: Secondary | ICD-10-CM

## 2021-12-07 DIAGNOSIS — G4733 Obstructive sleep apnea (adult) (pediatric): Secondary | ICD-10-CM | POA: Diagnosis not present

## 2021-12-07 DIAGNOSIS — G4719 Other hypersomnia: Secondary | ICD-10-CM

## 2021-12-07 DIAGNOSIS — K219 Gastro-esophageal reflux disease without esophagitis: Secondary | ICD-10-CM

## 2021-12-07 DIAGNOSIS — R200 Anesthesia of skin: Secondary | ICD-10-CM

## 2021-12-07 NOTE — Progress Notes (Signed)
[facility]  PATIENT: Anthony Greene DOB: 01-Dec-1950  REFERRING DOCTOR OR PCP:  Abelino Derrick, MD SOURCE: patient, notes form PCP, labs  _________________________________   HISTORICAL  CHIEF COMPLAINT:  Chief Complaint  Patient presents with   Follow-up    Pt in room #1 and alone. Pt here today for sleep consults.    HISTORY OF PRESENT ILLNESS:  He is a 71 yo man with snoring and mild excessive daytime sleepiness.  He sleeps 6 hours at night (10 pm and 4 am) and gets up twice to use the bathroom most nights.  He wakes up un refreshed.   He takes a nap every day now for about 30-45 minutes and he feels better afterwards.     He has snored x many years and his wife has told him it has worsened.    He has has several years of excessive dayitme sleepiness, especially watching TV or if he rests in bed or a chair for the past 3-4 years.   He has woken himself up with a snort sometimes during a nap but not in bed at night.    His wife has not noted pauses or snorts/gasps.     He has never had a sleep study.     He denies any cardiac or respiratory issues  I previously had seen him for numbness in the feet and neurogenic claudication/spinal stenosis he has severe spinal stenosis at L4-L5.Marland Kitchen   He had a non-fusion lumbar procedure by Dr. Ellene Route February 2023 and had done better until recently when numbness began to worsen again.   It is still less than he had before the laminotomy surgery.    He does not report pain or weakness.     His psoriatic arthritis was diagnosed in 1999.   He initially was on Enbrel but more recently switched to [place]  in 2020 and is well controlled.   He is active and walks some and plays golf.       LUMBAR SPINE HISTORY In 2019 he fell and broke the transverse processes from L1 through L4.  He hasd a lot of pain, now resolved.    He had the gradual onset of numbness in his feet in 2021 and saw me.  Most of the time, numbness is just in the feet  without pain but dysesthesias enter the ankles if he goes further.  The numbness is more on the lateral aspect of the foot than the dorsum.  Mild pain is achy.   Symptoms are not well appreciated much of the time, especially if he sits or is moving around.    It is worse with extended walking or standing.   Gabapentin had not helped and it made him sleepy  MRI 06/25/19 lumbar MRI showed severe spinal stenosis at L4-L5.  There is only a 4.5 mm diameter and this is likely the source of his symptoms.  MRI 03/17/2021 lumbar spine showed "Multilevel degenerative changes as detailed above. Canal and subarticular recess stenosis remains greatest at L4-L5 similar to the prior study. There is increased mild to moderate canal stenosis at L3-L4 with partial effacement of  subarticular recesses. Facet arthropathy remains greatest at L4-L5 with associated listhesis."   REVIEW OF SYSTEMS: Constitutional: No fevers, chills, sweats, or change in appetite Eyes: No visual changes, double vision, eye pain Ear, nose and throat: No hearing loss, ear pain, nasal congestion, sore throat Cardiovascular: No chest pain, palpitations Respiratory:  No shortness of breath at rest or  with exertion.   No wheezes GastrointestinaI: No nausea, vomiting, diarrhea, abdominal pain, fecal incontinence Genitourinary:  No dysuria, urinary retention or frequency.  No nocturia. Musculoskeletal:  No neck pain, back pain Integumentary: No rash, pruritus, skin lesions.  Psoriatic arthritis well-controlled on current meds Neurological: as above Psychiatric: No depression at this time.  No anxiety Endocrine: No palpitations, diaphoresis, change in appetite, change in weigh or increased thirst Hematologic/Lymphatic:  No anemia, purpura, petechiae. Allergic/Immunologic: No itchy/runny eyes, nasal congestion, recent allergic reactions, rashes  ALLERGIES: Allergies  Allergen Reactions   Dilaudid [Hydromorphone Hcl] Nausea And Vomiting    Morphine And Related Nausea And Vomiting   Vancomycin Other (See Comments)    redman syndrome   Penicillins Rash    Patient has only had rash reaction to PCN in the past.  Patient and wife state he Tolerates keflex and  Ancef with no problem.  Has patient had a PCN reaction causing immediate rash, facial/tongue/throat swelling, SOB or lightheadedness with hypotension: Yes- rash only Has patient had a PCN reaction causing severe rash involving mucus membranes or skin necrosis: No Has patient had a PCN reaction that required hospitalization: No Has patient had a PCN reaction occurring within the last 10 years: No    HOME MEDICATIONS:  Current Outpatient Medications:    celecoxib (CELEBREX) 200 MG capsule, Take 200 mg by mouth at bedtime. , Disp: , Rfl:    Golimumab ([place] ARIA IV), Inject into the vein., Disp: , Rfl:    omeprazole (PRILOSEC) 40 MG capsule, Take 1 capsule (40 mg total) by mouth daily., Disp: 30 capsule, Rfl: 3   tamsulosin (FLOMAX) 0.4 MG CAPS capsule, TAKE 2 CAPSULES EVERY DAY. (NEED APPOINTMENT BEFORE NEXT REFILL), Disp: 180 capsule, Rfl: 1   mometasone (NASONEX) 50 MCG/ACT nasal spray, Place 2 sprays into the nose daily. (Patient not taking: Reported on 12/07/2021), Disp: 1 each, Rfl: 12  PAST MEDICAL HISTORY: Past Medical History:  Diagnosis Date   Arthritis    Closed fracture of transverse process of lumbar vertebra ([place]) 06/06/2017   Hematoma of hip, right, initial encounter 06/23/2017   Injury resulting from fall from height 06/07/2017   Intractable low back pain 06/07/2017   Neuropathy    Osteoarthritis    Pilonidal abscess 06/22/2017   Pilonidal cyst 06/22/2017   Psoriasis    Psoriatic arthritis ([place])    Sebaceous cyst     PAST SURGICAL HISTORY: Past Surgical History:  Procedure Laterality Date   CARPAL TUNNEL RELEASE Right    COLONOSCOPY WITH PROPOFOL     DRESSING CHANGE UNDER ANESTHESIA N/A 06/25/2017   Procedure: DRESSING CHANGE UNDER ANESTHESIA;   Surgeon: Florene Glen, MD;  Location: [facility] ORS;  Service: General;  Laterality: N/A;   IRRIGATION AND DEBRIDEMENT BUTTOCKS Right 06/23/2017   Procedure: IRRIGATION AND DEBRIDEMENT BUTTOCKS;  Surgeon: Vickie Epley, MD;  Location: [facility] ORS;  Service: General;  Laterality: Right;   JOINT REPLACEMENT     BL knee   KNEE ARTHROSCOPY Right    LAMINOTOMY  2023   PILONIDAL CYST EXCISION N/A 01/20/2018   Procedure: EXCISION OF INCREASINGLY SYMPTOMATIC PILONIDAL CYST AND RIGHT OF UPPER BACK SEBACEOUS CYST;  Surgeon: Vickie Epley, MD;  Location: [facility] ORS;  Service: General;  Laterality: N/A;   TONSILLECTOMY      FAMILY HISTORY: Family History  Problem Relation Age of Onset   Asthma Mother    COPD Mother    Stroke Father    Heart disease Father  Colon cancer Brother    Cancer Brother    Colon polyps Neg Hx    Esophageal cancer Neg Hx    Stomach cancer Neg Hx    Rectal cancer Neg Hx     SOCIAL HISTORY:  Social History   Socioeconomic History   Marital status: Married    Spouse name: Not on file   Number of children: Not on file   Years of education: Not on file   Highest education level: Not on file  Occupational History   Not on file  Tobacco Use   Smoking status: Former    Types: Cigars   Smokeless tobacco: Never  Vaping Use   Vaping Use: Never used  Substance and Sexual Activity   Alcohol use: Yes    Comment: 1-3 alcoholic drinks daily   Drug use: Never   Sexual activity: Yes  Other Topics Concern   Not on file  Social History Narrative   Not on file   Social Determinants of Health   Financial Resource Strain: Low Risk  (08/18/2021)   Overall Financial Resource Strain (CARDIA)    Difficulty of Paying Living Expenses: Not hard at all  Food Insecurity: No Food Insecurity (08/18/2021)   Hunger Vital Sign    Worried About Running Out of Food in the Last Year: Never true    [place] in the Last Year: Never true  Transportation Needs: No  Transportation Needs (08/18/2021)   PRAPARE - Hydrologist (Medical): No    Lack of Transportation (Non-Medical): No  Physical Activity: Sufficiently Active (08/18/2021)   Exercise Vital Sign    Days of Exercise per Week: 5 days    Minutes of Exercise per Session: 30 min  Stress: No Stress Concern Present (08/18/2021)   [place]    Feeling of Stress : Not at all  Social Connections: Moderately Integrated (08/18/2021)   Social Connection and Isolation Panel [NHANES]    Frequency of Communication with Friends and Family: Three times a week    Frequency of Social Gatherings with Friends and Family: Three times a week    Attends Religious Services: More than 4 times per year    Active Member of Clubs or Organizations: No    Attends Archivist Meetings: Never    Marital Status: Married  Human resources officer Violence: Not At Risk (08/18/2021)   Humiliation, Afraid, Rape, and Kick questionnaire    Fear of Current or Ex-Partner: No    Emotionally Abused: No    Physically Abused: No    Sexually Abused: No     PHYSICAL EXAM  Vitals:   12/07/21 1001  BP: (!) 146/77  Pulse: 60  Weight: 221 lb (100.2 kg)  Height: '5\' 10"'$  (1.778 m)    Body mass index is 31.71 kg/m.   General: The patient is well-developed and well-nourished and in no acute distress   Neck circumference is 16.75 inches.   Pharynx is Mallamati 1.      HEENT:  Head is [place]/AT.  Sclera are anicteric.     Neck: No carotid bruits are noted.  The neck has good range of motion and is nontender  Cardiovascular: The heart has a regular rate and rhythm with a normal S1 and S2. There were no murmurs, gallops or rubs.    Skin: Extremities are without rash or  edema.  Musculoskeletal:  Back is nontender  Neurologic Exam  Mental status:  The patient is alert and oriented x 3 at the time of the examination. The patient has apparent  normal recent and remote memory, with an apparently normal attention span and concentration ability.   Speech is normal.  Cranial nerves: Extraocular movements are full.   Facial symmetry is present.  Facial strength is normal trapezius and sternocleidomastoid strength is normal. No dysarthria is noted  No obvious hearing deficits are noted.  Motor:  Muscle bulk is normal.   Tone is normal. Strength is  5 / 5 in all 4 extremities.   Sensory: Sensory testing is intact to pinprick, soft touch and vibration sensation in the proximal leg.  He had reduced vibration to 70% in toes but preserved pinpric  Coordination: Cerebellar testing reveals good finger-nose-finger and heel-to-shin bilaterally.  Gait and station: Station is normal.   Gait is normal. Tandem gait is normal for age. Romberg is negative.       Reflexes: Deep tendon reflexes are symmetric and normal in arms but absent at the knees and ankles.      DIAGNOSTIC DATA (LABS, IMAGING, TESTING) - I reviewed patient records, labs, notes, testing and imaging myself where available.    Lab Results  Component Value Date   VITAMINB12 293 02/26/2019   Lab Results  Component Value Date   TSH 1.87 02/26/2019       ASSESSMENT AND PLAN  OSA (obstructive sleep apnea) - Plan: Home sleep test  Snoring - Plan: Home sleep test  Excessive daytime sleepiness - Plan: Home sleep test  Spinal stenosis of lumbar region with neurogenic claudication  Leg numbness   He has probable obstructive sleep apnea with snoring, occasional waking up with gasping and excessive daytime sleepiness.  We will check a home sleep study. He has experienced some additional numbness in the feet recently but is much better than before the surgery.  If this worsens additional imaging may be needed. Return as needed based on the results of the home sleep study and where the CPAP get initiated.  40-minute office visit with the majority of the time spent  face-to-face for history and physical, discussion/counseling and decision-making.  Additional time with record review and documentation.   Daneshia Tavano A. Felecia Shelling, MD, [facility] 78/93/8101, 7:51 PM Certified in Neurology, Clinical Neurophysiology, Sleep Medicine and Neuroimaging  [facility] [address], [place], [place] [address] [phone]

## 2021-12-08 ENCOUNTER — Encounter: Payer: Self-pay | Admitting: Family Medicine

## 2021-12-08 NOTE — Telephone Encounter (Signed)
Patient calling with concerns about bill that he received from his last OV. Are you able to help with this?

## 2021-12-09 NOTE — Telephone Encounter (Signed)
Are you able to view and help patient with billing concerns. Patients wife in office today states that she's had the same issue in the past and was help by you. Both are aware Drue Dun out of the office until next week okay with waiting on a phone call.

## 2021-12-17 NOTE — Telephone Encounter (Signed)
Spoke to pt 11/29 12:00p about billing concerns.  DOS 09/22/21 Preventative was not completed and Sharyn Lull has sent to Charge correction to remove [phone].  DOS 09/23/21.  Charge Correction will resubmit $12 labs denied for wrong DX. Removing preventative code.  LVM to notify pt these changes will be made.

## 2021-12-22 ENCOUNTER — Telehealth: Payer: Self-pay | Admitting: Neurology

## 2021-12-22 NOTE — Telephone Encounter (Signed)
HST- Medicare/cigna no auth req  Patient is scheduled at [facility] for 1/924 at 9 AM.  Mailed packet to the patient.

## 2022-01-15 DIAGNOSIS — Z111 Encounter for screening for respiratory tuberculosis: Secondary | ICD-10-CM | POA: Diagnosis not present

## 2022-01-15 DIAGNOSIS — L4050 Arthropathic psoriasis, unspecified: Secondary | ICD-10-CM | POA: Diagnosis not present

## 2022-01-15 DIAGNOSIS — R5383 Other fatigue: Secondary | ICD-10-CM | POA: Diagnosis not present

## 2022-01-15 DIAGNOSIS — Z79899 Other long term (current) drug therapy: Secondary | ICD-10-CM | POA: Diagnosis not present

## 2022-01-21 ENCOUNTER — Encounter: Payer: Self-pay | Admitting: Family Medicine

## 2022-01-22 ENCOUNTER — Other Ambulatory Visit: Payer: Self-pay

## 2022-01-22 DIAGNOSIS — N401 Enlarged prostate with lower urinary tract symptoms: Secondary | ICD-10-CM

## 2022-01-22 DIAGNOSIS — K219 Gastro-esophageal reflux disease without esophagitis: Secondary | ICD-10-CM

## 2022-01-22 MED ORDER — TAMSULOSIN HCL 0.4 MG PO CAPS: ORAL_CAPSULE | ORAL | 1 refills | Status: DC

## 2022-01-22 MED ORDER — OMEPRAZOLE 40 MG PO CPDR: 40.0000 mg | DELAYED_RELEASE_CAPSULE | Freq: Every day | ORAL | 3 refills | Status: DC

## 2022-01-26 ENCOUNTER — Ambulatory Visit (INDEPENDENT_AMBULATORY_CARE_PROVIDER_SITE_OTHER): Payer: Medicare Other | Admitting: Neurology

## 2022-01-26 DIAGNOSIS — G4733 Obstructive sleep apnea (adult) (pediatric): Secondary | ICD-10-CM | POA: Diagnosis not present

## 2022-01-26 DIAGNOSIS — G4719 Other hypersomnia: Secondary | ICD-10-CM

## 2022-01-26 DIAGNOSIS — R0683 Snoring: Secondary | ICD-10-CM

## 2022-01-28 NOTE — Progress Notes (Signed)
   [facility]  HOME SLEEP STUDY  STUDY DATE: 01/26/2022 PATIENT NAME: Anthony Greene DOB: 03-07-1950 MRN: [id number]  ORDERING CLINICIAN: Richard A. Felecia Shelling, MD, PhD INTERPRETING CLINICIAN: Richard A. Felecia Shelling, MD. PhD   CLINICAL INFORMATION: 72 year old man with snoring, excessive daytime sleepiness who has occasional awakenings associated with gasping.   IMPRESSION:  Severe OSA with pAHI 3% 53.6.  OSA was severe in both REM and non-REM sleep. Normal sleep efficiency of 87% No significant hypoxemia was noted.  Only 1.2 minutes of the night with SaO2 below 88%.   RECOMMENDATION: Recommend AutoPap 5 to 15 cm H2O pressure with heated humidifier and download in 30 to 90 days Follow-up with Dr. Felecia Shelling in 60 to 90 days.   INTERPRETING PHYSICIAN:   Richard A. Felecia Shelling, MD, PhD, [facility] Certified in Neurology, Clinical Neurophysiology, Sleep Medicine, Pain Medicine and Neuroimaging  [facility] [address] [place], [place] [address] [phone]

## 2022-02-02 ENCOUNTER — Telehealth: Payer: Self-pay | Admitting: *Deleted

## 2022-02-02 DIAGNOSIS — G4733 Obstructive sleep apnea (adult) (pediatric): Secondary | ICD-10-CM

## 2022-02-02 NOTE — Telephone Encounter (Signed)
I called pt. I advised pt that Dr. Felecia Shelling reviewed their sleep study results and found that pt has sleep apnea. Dr. Felecia Shelling recommends that pt start CPAP. I reviewed PAP compliance expectations with the pt. Pt is agreeable to starting a CPAP. I advised pt that an order will be sent to a DME, [employer], and [employer] will call the pt within about one week after they file with the pt's insurance. [employer] will show the pt how to use the machine, fit for masks, and troubleshoot the CPAP if needed. Provided pt [employer] phone# [phone] in case they do not reach out in the next week and he can call. Sent community message to [employer] that order placed.   Aware phone staff will reach out to schedule intial cpap f/u at a later time.

## 2022-02-02 NOTE — Telephone Encounter (Signed)
-----  Message from Britt Bottom, MD sent at 01/29/2022  2:54 PM EST ----- Regarding: Home sleep study, set up AutoPap He has severe OSA.  Please set him up for AutoPap with a range 5 to 15 cm with heated humidifier and follow-up with me or one of the nurse practitioners in about 2 to 3 months

## 2022-02-02 NOTE — Telephone Encounter (Signed)
Called pt at [phone]. LVM for him to call office

## 2022-02-02 NOTE — Addendum Note (Signed)
Addended by: Wyvonnia Lora on: 02/02/2022 03:46 PM   Modules accepted: Orders

## 2022-02-02 NOTE — Telephone Encounter (Signed)
LVM for pt to call about results. °

## 2022-02-02 NOTE — Telephone Encounter (Signed)
After checking DPR, phone rep left a vm asking pt to call and schedule his initial CPAP f/u early April'24

## 2022-02-02 NOTE — Telephone Encounter (Signed)
Pt returned a call from nurse. Requesting a call back.

## 2022-02-04 NOTE — Telephone Encounter (Signed)
Noted  

## 2022-02-04 NOTE — Telephone Encounter (Signed)
Called pt to make initial CPAP appointment. Stated he is considering CPAP machine but has not made a decision and will call back if he decides to move forward.

## 2022-02-28 ENCOUNTER — Encounter: Payer: Self-pay | Admitting: Neurology

## 2022-03-18 DIAGNOSIS — M1991 Primary osteoarthritis, unspecified site: Secondary | ICD-10-CM | POA: Diagnosis not present

## 2022-03-18 DIAGNOSIS — L409 Psoriasis, unspecified: Secondary | ICD-10-CM | POA: Diagnosis not present

## 2022-03-18 DIAGNOSIS — E669 Obesity, unspecified: Secondary | ICD-10-CM | POA: Diagnosis not present

## 2022-03-18 DIAGNOSIS — Z79899 Other long term (current) drug therapy: Secondary | ICD-10-CM | POA: Diagnosis not present

## 2022-03-18 DIAGNOSIS — L4050 Arthropathic psoriasis, unspecified: Secondary | ICD-10-CM | POA: Diagnosis not present

## 2022-03-18 DIAGNOSIS — Z6832 Body mass index (BMI) 32.0-32.9, adult: Secondary | ICD-10-CM | POA: Diagnosis not present

## 2022-03-19 DIAGNOSIS — L4050 Arthropathic psoriasis, unspecified: Secondary | ICD-10-CM | POA: Diagnosis not present

## 2022-04-23 IMAGING — DX DG CHEST 2V
2 series · 2 of 2 positions shown · non-contrast
Comparison: None.

CLINICAL DATA: Abnormal pelvis x-ray.  Lytic lesions.

EXAM:
CHEST - 2 VIEW

[chest pa]
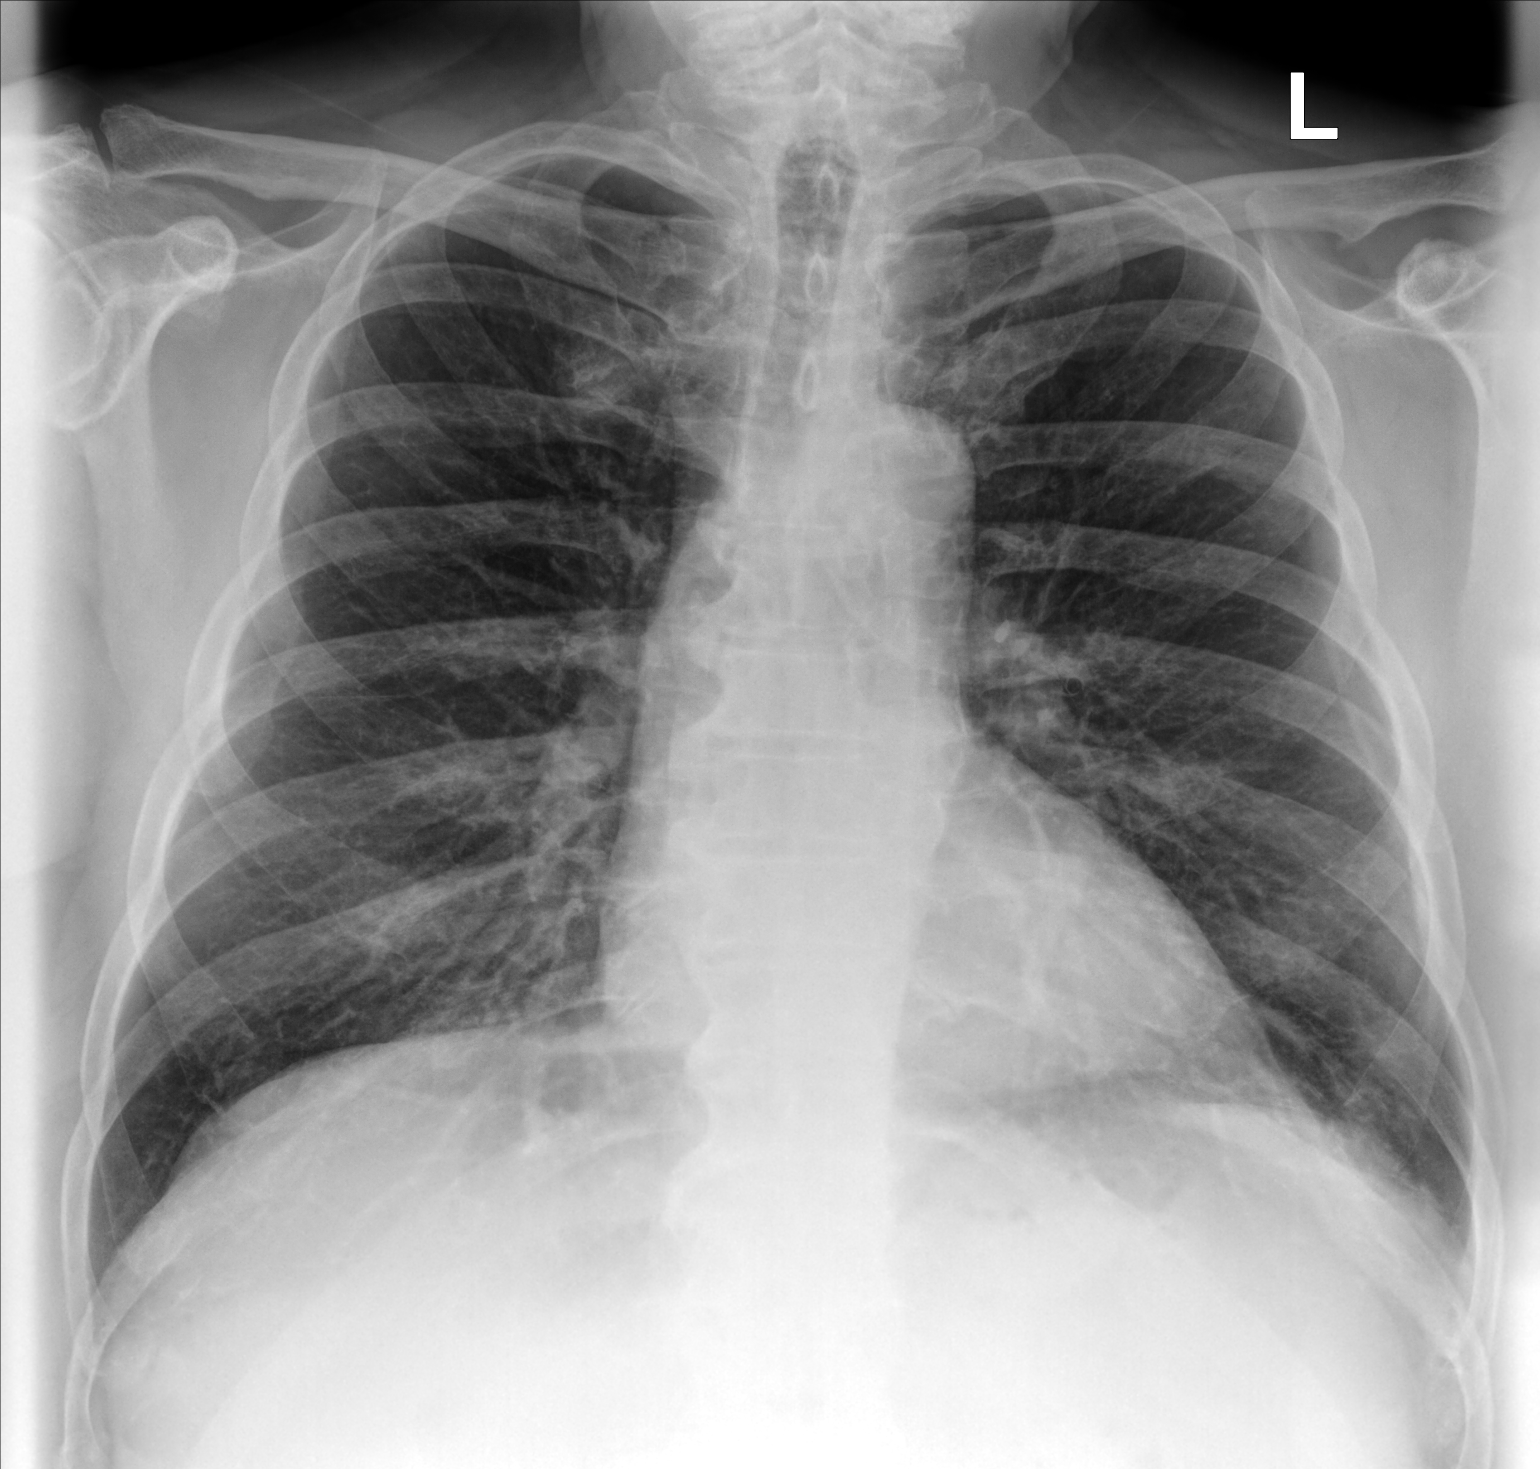

[chest lat]
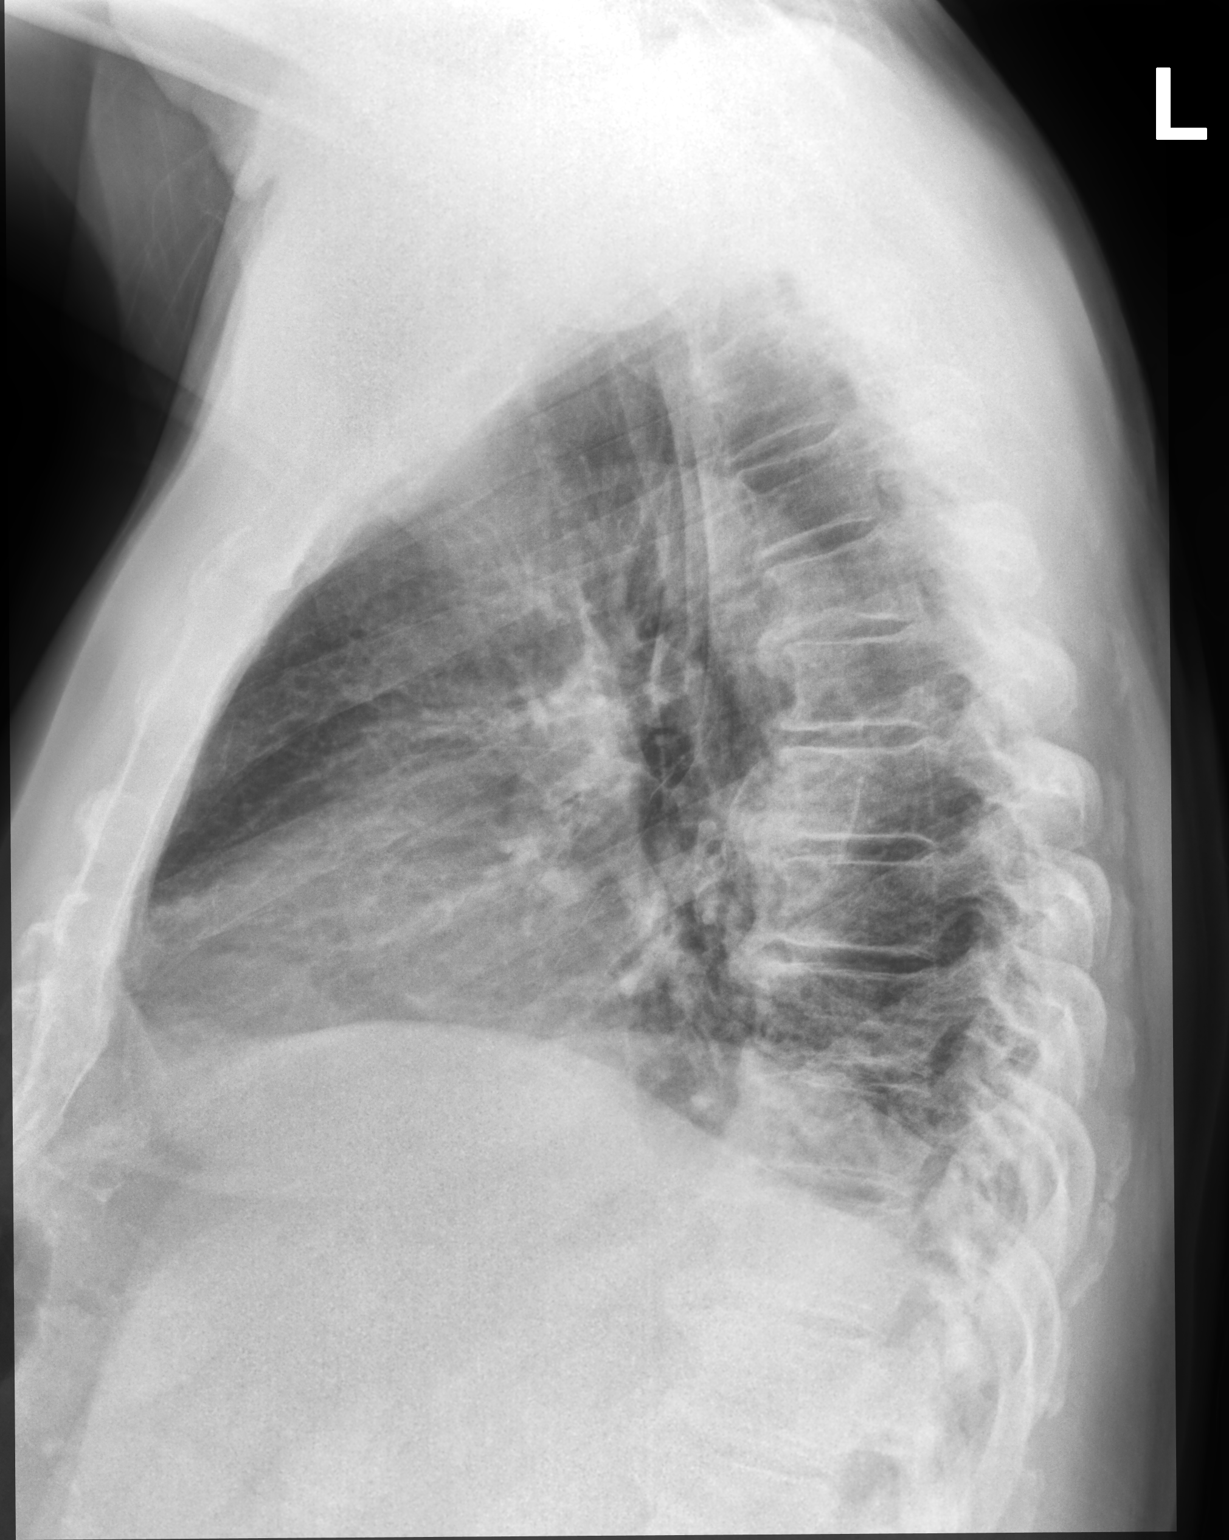

[2 of 2 positions shown; findings below may reference images not displayed]

FINDINGS: Heart size normal. Lungs are clear. Degenerative endplate changes
are present in the thoracic spine. Inferior endplate fracture at T6
appears remote. Lung volumes are somewhat low. No definite lytic
lesions are present.
IMPRESSION: 1. No acute cardiopulmonary disease.
2. No definite lytic lesions.
3. Inferior endplate fracture at T6 appears remote.

## 2022-05-03 ENCOUNTER — Encounter: Payer: Self-pay | Admitting: *Deleted

## 2022-05-04 DIAGNOSIS — B351 Tinea unguium: Secondary | ICD-10-CM | POA: Diagnosis not present

## 2022-05-14 DIAGNOSIS — L4050 Arthropathic psoriasis, unspecified: Secondary | ICD-10-CM | POA: Diagnosis not present

## 2022-05-14 DIAGNOSIS — H04321 Acute dacryocystitis of right lacrimal passage: Secondary | ICD-10-CM | POA: Diagnosis not present

## 2022-05-14 DIAGNOSIS — Z79899 Other long term (current) drug therapy: Secondary | ICD-10-CM | POA: Diagnosis not present

## 2022-05-14 DIAGNOSIS — R5383 Other fatigue: Secondary | ICD-10-CM | POA: Diagnosis not present

## 2022-05-14 DIAGNOSIS — H0469 Other changes of lacrimal passages: Secondary | ICD-10-CM | POA: Diagnosis not present

## 2022-06-28 ENCOUNTER — Other Ambulatory Visit: Payer: Self-pay | Admitting: Family Medicine

## 2022-06-28 DIAGNOSIS — N401 Enlarged prostate with lower urinary tract symptoms: Secondary | ICD-10-CM

## 2022-07-07 ENCOUNTER — Telehealth: Payer: Medicare Other | Admitting: Nurse Practitioner

## 2022-07-07 DIAGNOSIS — U071 COVID-19: Secondary | ICD-10-CM | POA: Diagnosis not present

## 2022-07-07 MED ORDER — NIRMATRELVIR/RITONAVIR (PAXLOVID)TABLET: 3.0000 | ORAL_TABLET | Freq: Two times a day (BID) | ORAL | 0 refills | Status: AC

## 2022-07-07 MED ORDER — IPRATROPIUM BROMIDE 0.03 % NA SOLN: 2.0000 | Freq: Two times a day (BID) | NASAL | 12 refills | Status: DC

## 2022-07-07 NOTE — Progress Notes (Signed)
Virtual Visit Consent   Anthony Greene, you are scheduled for a virtual visit with a [place] provider today. Just as with appointments in the office, your consent must be obtained to participate. Your consent will be active for this visit and any virtual visit you may have with one of our providers in the next 365 days. If you have a MyChart account, a copy of this consent can be sent to you electronically.  As this is a virtual visit, video technology does not allow for your provider to perform a traditional examination. This may limit your provider's ability to fully assess your condition. If your provider identifies any concerns that need to be evaluated in person or the need to arrange testing (such as labs, EKG, etc.), we will make arrangements to do so. Although advances in technology are sophisticated, we cannot ensure that it will always work on either your end or our end. If the connection with a video visit is poor, the visit may have to be switched to a telephone visit. With either a video or telephone visit, we are not always able to ensure that we have a secure connection.  By engaging in this virtual visit, you consent to the provision of healthcare and authorize for your insurance to be billed (if applicable) for the services provided during this visit. Depending on your insurance coverage, you may receive a charge related to this service.  I need to obtain your verbal consent now. Are you willing to proceed with your visit today? Anthony Greene has provided verbal consent on 07/07/2022 for a virtual visit (video or telephone). Viviano Simas, FNP  Date: 07/07/2022 7:57 AM  Virtual Visit via Video Note   I, Viviano Simas, connected with  Anthony Greene  ([id number], 08/29/1950) on 07/07/22 at  8:00 AM EDT by a video-enabled telemedicine application and verified that I am speaking with the correct person using two identifiers.  Location: Patient: Virtual Visit Location Patient:  Home Provider: Virtual Visit Location Provider: Home Office   I discussed the limitations of evaluation and management by telemedicine and the availability of in person appointments. The patient expressed understanding and agreed to proceed.    History of Present Illness: Anthony Greene is a 72 y.o. who identifies as a male who was assigned male at birth, and is being seen today for COVID-19.  Recently returned from [place]  Symptom onset was 3-4 days ago  COVID positive with a home test last night   Symptoms include: Nasal congestion  Sinus pressure  No fever  Slight cough   He has been using dayquil and nyquil for symptom relief   Use Paxlovid with good outcome in the past  He has been vaccinated with recent booster for COVID   Denies any recent issues with kidney's   GFR 79  Denies need for inhalers in the past   Problems:  Patient Active Problem List   Diagnosis Date Noted   OSA (obstructive sleep apnea) 12/07/2021   Excessive daytime sleepiness 12/07/2021   Elevated LDL cholesterol level 11/23/2021   Dysfunction of both eustachian tubes 11/23/2021   Gastroesophageal reflux disease 09/22/2021   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   Abnormal x-ray of pelvis 09/11/2020   Disorder of prostate, unspecified  09/11/2020   Snoring 09/03/2020   Greater trochanteric pain syndrome of left lower extremity 09/03/2020   Bilateral leg cramps 02/26/2019   Paresthesias 02/26/2019   Need for tetanus, diphtheria, and acellular pertussis (Tdap) vaccine in patient of  adolescent age or older 02/26/2019   Spinal stenosis of lumbosacral region 02/26/2019   History of COVID-19 02/26/2019   Paresthesia of both feet 02/26/2019   COVID-19 virus infection 11/03/2018   Viral upper respiratory tract infection 10/30/2018   Presbycusis of both ears 01/10/2018   Excessive cerumen in both ear canals 12/26/2017   Hearing loss 12/26/2017   Skin abnormalities 12/26/2017   Benign prostatic  hyperplasia with nocturia 12/26/2017   Anemia 12/26/2017   Elevated PSA 12/26/2017   Healthcare maintenance 12/21/2017   Elevated BP without diagnosis of hypertension 07/11/2017   Gluteal abscess    Hematoma of hip, right, initial encounter 06/23/2017   Daily consumption of alcohol 06/07/2017   Injury resulting from fall from height 06/07/2017   Intractable low back pain 06/07/2017   Closed fracture of transverse process of lumbar vertebra (HCC) 06/06/2017   Family history of prostate cancer 12/16/2015   Psoriatic arthritis (HCC) 01/19/1999    Allergies:  Allergies  Allergen Reactions   Dilaudid [Hydromorphone Hcl] Nausea And Vomiting   Morphine And Codeine Nausea And Vomiting   Vancomycin Other (See Comments)    redman syndrome   Penicillins Rash    Patient has only had rash reaction to PCN in the past.  Patient and wife state he Tolerates keflex and  Ancef with no problem.  Has patient had a PCN reaction causing immediate rash, facial/tongue/throat swelling, SOB or lightheadedness with hypotension: Yes- rash only Has patient had a PCN reaction causing severe rash involving mucus membranes or skin necrosis: No Has patient had a PCN reaction that required hospitalization: No Has patient had a PCN reaction occurring within the last 10 years: No   Medications:  Current Outpatient Medications:    celecoxib (CELEBREX) 200 MG capsule, Take 200 mg by mouth at bedtime. , Disp: , Rfl:    Golimumab ([place] ARIA IV), Inject into the vein., Disp: , Rfl:    omeprazole (PRILOSEC) 40 MG capsule, Take 1 capsule (40 mg total) by mouth daily., Disp: 90 capsule, Rfl: 3   tamsulosin (FLOMAX) 0.4 MG CAPS capsule, TAKE 2 CAPSULES DAILY (NEED APPOINTMENT BEFORE NEXT REFILL), Disp: 180 capsule, Rfl: 3  Observations/Objective: Patient is well-developed, well-nourished in no acute distress.  Resting comfortably  at home.  Head is normocephalic, atraumatic.  No labored breathing.  Speech is clear and  coherent with logical content.  Patient is alert and oriented at baseline.    Assessment and Plan: 1. COVID-19  - nirmatrelvir/ritonavir (PAXLOVID) 20 x 150 MG & 10 x 100MG  TABS; Take 3 tablets by mouth 2 (two) times daily for 5 days. (Take nirmatrelvir 150 mg two tablets twice daily for 5 days and ritonavir 100 mg one tablet twice daily for 5 days) Patient GFR is 79  Dispense: 30 tablet; Refill: 0 - ipratropium (ATROVENT) 0.03 % nasal spray; Place 2 sprays into both nostrils every 12 (twelve) hours.  Dispense: 30 mL; Refill: 12     Follow Up Instructions: I discussed the assessment and treatment plan with the patient. The patient was provided an opportunity to ask questions and all were answered. The patient agreed with the plan and demonstrated an understanding of the instructions.  A copy of instructions were sent to the patient via MyChart unless otherwise noted below.    The patient was advised to call back or seek an in-person evaluation if the symptoms worsen or if the condition fails to improve as anticipated.  Time:  I spent 15 minutes with the patient  via telehealth technology discussing the above problems/concerns.    Apolonio Schneiders, FNP

## 2022-07-13 DIAGNOSIS — Z79899 Other long term (current) drug therapy: Secondary | ICD-10-CM | POA: Diagnosis not present

## 2022-07-13 DIAGNOSIS — L4050 Arthropathic psoriasis, unspecified: Secondary | ICD-10-CM | POA: Diagnosis not present

## 2022-08-20 ENCOUNTER — Ambulatory Visit (INDEPENDENT_AMBULATORY_CARE_PROVIDER_SITE_OTHER): Payer: Medicare Other

## 2022-08-20 DIAGNOSIS — Z0000 Encounter for general adult medical examination without abnormal findings: Secondary | ICD-10-CM | POA: Diagnosis not present

## 2022-08-20 NOTE — Progress Notes (Signed)
Subjective:   [name] is a 72 y.o. male who presents for Medicare Annual/Subsequent preventive examination.  Visit Complete: Virtual  I connected with  Anthony Greene on 08/20/22 by a audio enabled telemedicine application and verified that I am speaking with the correct person using two identifiers.  Patient Location: Home  Provider Location: Office/Clinic  I discussed the limitations of evaluation and management by telemedicine. The patient expressed understanding and agreed to proceed.  Vital Signs: Patient was unable to self-report vital signs via telehealth due to a lack of equipment at home.  Patient Medicare AWV questionnaire was completed by the patient on 08/16/2022; I have confirmed that all information answered by patient is correct and no changes since this date.  Review of Systems     Cardiac Risk Factors include: advanced age (>37men, >1 women);male gender     Objective:    Today's Vitals   There is no height or weight on file to calculate BMI.     08/20/2022    8:44 AM 08/24/2021    2:19 PM 08/18/2021    9:53 AM 08/12/2020    9:56 AM 04/18/2019    8:53 AM 01/09/2018    8:37 AM 12/21/2017    9:17 AM  Advanced Directives  Does Patient Have a Medical Advance Directive? Yes No Yes Yes Yes Yes Yes  Type of Estate agent of [place];Living will  Healthcare Power of [place];Living will Healthcare Power of [place];Living will Healthcare Power of [place];Living will Healthcare Power of [place];Living will Healthcare Power of [place];Living will  Does patient want to make changes to medical advance directive?  No - Patient declined   No - Patient declined    Copy of Healthcare Power of Attorney in Chart? No - copy requested  No - copy requested Yes - validated most recent copy scanned in chart (See row information) No - copy requested No - copy requested No - copy requested    Current Medications (verified) Outpatient Encounter Medications  as of 08/20/2022  Medication Sig   celecoxib (CELEBREX) 200 MG capsule Take 200 mg by mouth at bedtime.    Golimumab (SIMPONI ARIA IV) Inject into the vein.   omeprazole (PRILOSEC) 40 MG capsule Take 1 capsule (40 mg total) by mouth daily.   tamsulosin (FLOMAX) 0.4 MG CAPS capsule TAKE 2 CAPSULES DAILY (NEED APPOINTMENT BEFORE NEXT REFILL)   ipratropium (ATROVENT) 0.03 % nasal spray Place 2 sprays into both nostrils every 12 (twelve) hours.   No facility-administered encounter medications on file as of 08/20/2022.    Allergies (verified) Dilaudid [hydromorphone hcl], Morphine and codeine, Vancomycin, and Penicillins   History: Past Medical History:  Diagnosis Date   Arthritis    Closed fracture of transverse process of lumbar vertebra (HCC) 06/06/2017   Hematoma of hip, right, initial encounter 06/23/2017   Injury resulting from fall from height 06/07/2017   Intractable low back pain 06/07/2017   Neuropathy    Osteoarthritis    Pilonidal abscess 06/22/2017   Pilonidal cyst 06/22/2017   Psoriasis    Psoriatic arthritis (HCC)    Sebaceous cyst    Past Surgical History:  Procedure Laterality Date   CARPAL TUNNEL RELEASE Right    COLONOSCOPY WITH PROPOFOL     DRESSING CHANGE UNDER ANESTHESIA N/A 06/25/2017   Procedure: DRESSING CHANGE UNDER ANESTHESIA;  Surgeon: Lattie Haw, MD;  Location: [facility] ORS;  Service: General;  Laterality: N/A;   IRRIGATION AND DEBRIDEMENT BUTTOCKS Right 06/23/2017   Procedure: IRRIGATION AND  DEBRIDEMENT BUTTOCKS;  Surgeon: Ancil Linsey, MD;  Location: [facility] OR[facility];  Service: General;  Laterality: Right;   JOINT REPLACEMENT     BL knee   KNEE ARTHROSCOPY Right    LAMINOTOMY  2023   PILONIDAL CYST EXCISION N/A 01/20/2018   Procedure: EXCISION OF INCREASINGLY SYMPTOMATIC PILONIDAL CYST AND RIGHT OF UPPER BACK SEBACEOUS CYST;  Surgeon: Ancil Linsey, MD;  Location: [facility] OR[facility];  Service: General;  Laterality: N/A;   TONSILLECTOMY     Family History  Problem  Relation Age of Onset   Asthma Mother    COPD Mother    Stroke Father    Heart disease Father    Colon cancer Brother    Cancer Brother    Colon polyps Neg Hx    Esophageal cancer Neg Hx    Stomach cancer Neg Hx    Rectal cancer Neg Hx    Social History   Socioeconomic History   Marital status: Married    Spouse name: Not on file   Number of children: Not on file   Years of education: Not on file   Highest education level: Not on file  Occupational History   Not on file  Tobacco Use   Smoking status: Former    Types: Cigars   Smokeless tobacco: Never  Vaping Use   Vaping status: Never Used  Substance and Sexual Activity   Alcohol use: Yes    Comment: 1-3 alcoholic drinks daily   Drug use: Never   Sexual activity: Yes  Other Topics Concern   Not on file  Social History Narrative   Not on file   Social Determinants of Health   Financial Resource Strain: Low Risk  (08/16/2022)   Overall Financial Resource Strain (CARDIA)    Difficulty of Paying Living Expenses: Not hard at all  Food Insecurity: No Food Insecurity (08/16/2022)   Hunger Vital Sign    Worried About Running Out of Food in the Last Year: Never true    Ran Out of Food in the Last Year: Never true  Transportation Needs: No Transportation Needs (08/16/2022)   PRAPARE - Administrator, Civil Service (Medical): No    Lack of Transportation (Non-Medical): No  Physical Activity: Sufficiently Active (08/16/2022)   Exercise Vital Sign    Days of Exercise per Week: 6 days    Minutes of Exercise per Session: 30 min  Stress: No Stress Concern Present (08/16/2022)   [employer] of Occupational Health - Occupational Stress Questionnaire    Feeling of Stress : Not at all  Social Connections: Unknown (08/16/2022)   Social Connection and Isolation Panel [NHANES]    Frequency of Communication with Friends and Family: More than three times a week    Frequency of Social Gatherings with Friends and Family:  More than three times a week    Attends Religious Services: Not on Marketing executive or Organizations: No    Attends Banker Meetings: Never    Marital Status: Married    Tobacco Counseling Counseling given: Not Answered   Clinical Intake:  Pre-visit preparation completed: Yes  Pain : No/denies pain     Nutritional Risks: None Diabetes: No  How often do you need to have someone help you when you read instructions, pamphlets, or other written materials from your doctor or pharmacy?: 1 - Never  Interpreter Needed?: No  Information entered by :: NAllen LPN   Activities of Daily Living  08/16/2022   10:13 AM  In your present state of health, do you have any difficulty performing the following activities:  Hearing? 0  Vision? 0  Difficulty concentrating or making decisions? 0  Walking or climbing stairs? 0  Dressing or bathing? 0  Doing errands, shopping? 0  Preparing Food and eating ? N  Using the Toilet? N  In the past six months, have you accidently leaked urine? N  Do you have problems with loss of bowel control? N  Managing your Medications? N  Managing your Finances? N  Housekeeping or managing your Housekeeping? N    Patient Care Team: Mliss Sax, MD as PCP - General (Family Medicine) Gaspar Cola, [facility] (Inactive) as Pharmacist (Pharmacist)  Indicate any recent Medical Services you may have received from other than [facility] providers in the past year (date may be approximate).     Assessment:   This is a routine wellness examination for Anthony Greene.  Hearing/Vision screen Hearing Screening - Comments:: Denies hearing issues Vision Screening - Comments:: Regular eye exams, [facility]  Dietary issues and exercise activities discussed:     Goals Addressed             This Visit's Progress    Patient Stated       08/20/2022, denies any goals at this time       Depression Screen    08/20/2022    8:45 AM  11/23/2021   10:47 AM 09/22/2021    3:34 PM 09/04/2021    2:43 PM 08/24/2021    2:19 PM 08/18/2021    9:55 AM 08/18/2021    9:52 AM  PHQ 2/9 Scores  PHQ - 2 Score 0 0 0 0 0 0 0  PHQ- 9 Score 0          Fall Risk    08/16/2022   10:13 AM 11/23/2021   10:47 AM 09/22/2021    3:34 PM 08/18/2021    9:55 AM 08/14/2021    5:26 PM  Fall Risk   Falls in the past year? 0 0 0 0 0  Number falls in past yr: 0 0 0 0 0  Injury with Fall? 0 0  0 0  Risk for fall due to : Medication side effect      Follow up Falls prevention discussed;Falls evaluation completed   Falls evaluation completed;Education provided     MEDICARE RISK AT HOME:  Medicare Risk at Home - 08/20/22 0853     Any stairs in or around the home? Yes    If so, are there any without handrails? No    Home free of loose throw rugs in walkways, pet beds, electrical cords, etc? No    Adequate lighting in your home to reduce risk of falls? Yes    Life alert? No    Use of a cane, walker or w/c? No    Grab bars in the bathroom? No    Shower chair or bench in shower? Yes    Elevated toilet seat or a handicapped toilet? No             TIMED UP AND GO:  Was the test performed?  No    Cognitive Function:        08/20/2022    8:45 AM 08/24/2021    2:20 PM  6CIT Screen  What Year? 0 points 0 points  What month? 0 points 0 points  What time? 0 points 0 points  Count back  from 20 0 points 0 points  Months in reverse 0 points 0 points  Repeat phrase 2 points 0 points  Total Score 2 points 0 points    Immunizations Immunization History  Administered Date(s) Administered   Fluad Quad(high Dose 65+) 09/09/2015, 09/12/2018, 09/29/2021   Influenza Inj Mdck Quad Pf 11/01/2017   Influenza Split 11/05/2014   Influenza,inj,quad, With Preservative 09/09/2015   Influenza-Unspecified 09/18/2016, 11/02/2019   PFIZER(Purple Top)SARS-COV-2 Vaccination 02/24/2019, 03/21/2019, 09/03/2019, 05/08/2020   Pneumococcal Polysaccharide-23 11/05/2014    Tdap 02/26/2019   Zoster Recombinant(Shingrix) 11/28/2019    TDAP status: Up to date  Flu Vaccine status: Due, Education has been provided regarding the importance of this vaccine. Advised may receive this vaccine at local pharmacy or Health Dept. Aware to provide a copy of the vaccination record if obtained from local pharmacy or Health Dept. Verbalized acceptance and understanding.  Pneumococcal vaccine status: Up to date  Covid-19 vaccine status: Completed vaccines  Qualifies for Shingles Vaccine? Yes   Zostavax completed Yes   Shingrix Completed?: Yes  Screening Tests Health Maintenance  Topic Date Due   Hepatitis C Screening  Never done   Pneumonia Vaccine 61+ Years old (2 of 2 - PCV) 11/05/2015   INFLUENZA VACCINE  08/19/2022   Medicare Annual Wellness (AWV)  08/20/2023   Colonoscopy  08/24/2024   DTaP/Tdap/Td (2 - Td or Tdap) 02/25/2029   HPV VACCINES  Aged Out   COVID-19 Vaccine  Discontinued   Zoster Vaccines- Shingrix  Discontinued    Health Maintenance  Health Maintenance Due  Topic Date Due   Hepatitis C Screening  Never done   Pneumonia Vaccine 3+ Years old (2 of 2 - PCV) 11/05/2015   INFLUENZA VACCINE  08/19/2022    Colorectal cancer screening: Type of screening: Colonoscopy. Completed 08/24/2021. Repeat every 3 years  Lung Cancer Screening: (Low Dose CT Chest recommended if Age 96-80 years, 20 pack-year currently smoking OR have quit w/in 15years.) does not qualify.   Lung Cancer Screening Referral: no  Additional Screening:  Hepatitis C Screening: does qualify;   Vision Screening: Recommended annual ophthalmology exams for early detection of glaucoma and other disorders of the eye. Is the patient up to date with their annual eye exam?  Yes  Who is the provider or what is the name of the office in which the patient attends annual eye exams? [facility] If pt is not established with a provider, would they like to be referred to a provider to establish  care? No .   Dental Screening: Recommended annual dental exams for proper oral hygiene  Diabetic Foot Exam: n/a  Community Resource Referral / Chronic Care Management: CRR required this visit?  No   CCM required this visit?  No     Plan:     I have personally reviewed and noted the following in the patient's chart:   Medical and social history Use of alcohol, tobacco or illicit drugs  Current medications and supplements including opioid prescriptions. Patient is not currently taking opioid prescriptions. Functional ability and status Nutritional status Physical activity Advanced directives List of other physicians Hospitalizations, surgeries, and ER visits in previous 12 months Vitals Screenings to include cognitive, depression, and falls Referrals and appointments  In addition, I have reviewed and discussed with patient certain preventive protocols, quality metrics, and best practice recommendations. A written personalized care plan for preventive services as well as general preventive health recommendations were provided to patient.     Barb Merino,  LPN   07/19/5364   After Visit Summary: (MyChart) Due to this being a telephonic visit, the after visit summary with patients personalized plan was offered to patient via MyChart   Nurse Notes: none

## 2022-08-20 NOTE — Patient Instructions (Signed)
Anthony Greene , Thank you for taking time to come for your Medicare Wellness Visit. I appreciate your ongoing commitment to your health goals. Please review the following plan we discussed and let me know if I can assist you in the future.   Referrals/Orders/Follow-Ups/Clinician Recommendations: none  This is a list of the screening recommended for you and due dates:  Health Maintenance  Topic Date Due   Hepatitis C Screening  Never done   Pneumonia Vaccine (2 of 2 - PCV) 11/05/2015   Flu Shot  08/19/2022   Medicare Annual Wellness Visit  08/20/2023   Colon Cancer Screening  08/24/2024   DTaP/Tdap/Td vaccine (2 - Td or Tdap) 02/25/2029   HPV Vaccine  Aged Out   COVID-19 Vaccine  Discontinued   Zoster (Shingles) Vaccine  Discontinued    Advanced directives: (Copy Requested) Please bring a copy of your health care power of attorney and living will to the office to be added to your chart at your convenience.  Next Medicare Annual Wellness Visit scheduled for next year: Yes  Preventive Care 12 Years and Older, Male  Preventive care refers to lifestyle choices and visits with your health care provider that can promote health and wellness. What does preventive care include? A yearly physical exam. This is also called an annual well check. Dental exams once or twice a year. Routine eye exams. Ask your health care provider how often you should have your eyes checked. Personal lifestyle choices, including: Daily care of your teeth and gums. Regular physical activity. Eating a healthy diet. Avoiding tobacco and drug use. Limiting alcohol use. Practicing safe sex. Taking low doses of aspirin every day. Taking vitamin and mineral supplements as recommended by your health care provider. What happens during an annual well check? The services and screenings done by your health care provider during your annual well check will depend on your age, overall health, lifestyle risk factors, and  family history of disease. Counseling  Your health care provider may ask you questions about your: Alcohol use. Tobacco use. Drug use. Emotional well-being. Home and relationship well-being. Sexual activity. Eating habits. History of falls. Memory and ability to understand (cognition). Work and work Astronomer. Screening  You may have the following tests or measurements: Height, weight, and BMI. Blood pressure. Lipid and cholesterol levels. These may be checked every 5 years, or more frequently if you are over 39 years old. Skin check. Lung cancer screening. You may have this screening every year starting at age 39 if you have a 30-pack-year history of smoking and currently smoke or have quit within the past 15 years. Fecal occult blood test (FOBT) of the stool. You may have this test every year starting at age 70. Flexible sigmoidoscopy or colonoscopy. You may have a sigmoidoscopy every 5 years or a colonoscopy every 10 years starting at age 63. Prostate cancer screening. Recommendations will vary depending on your family history and other risks. Hepatitis C blood test. Hepatitis B blood test. Sexually transmitted disease (STD) testing. Diabetes screening. This is done by checking your blood sugar (glucose) after you have not eaten for a while (fasting). You may have this done every 1-3 years. Abdominal aortic aneurysm (AAA) screening. You may need this if you are a current or former smoker. Osteoporosis. You may be screened starting at age 64 if you are at high risk. Talk with your health care provider about your test results, treatment options, and if necessary, the need for more tests. Vaccines  Your  health care provider may recommend certain vaccines, such as: Influenza vaccine. This is recommended every year. Tetanus, diphtheria, and acellular pertussis (Tdap, Td) vaccine. You may need a Td booster every 10 years. Zoster vaccine. You may need this after age 25. Pneumococcal  13-valent conjugate (PCV13) vaccine. One dose is recommended after age 20. Pneumococcal polysaccharide (PPSV23) vaccine. One dose is recommended after age 68. Talk to your health care provider about which screenings and vaccines you need and how often you need them. This information is not intended to replace advice given to you by your health care provider. Make sure you discuss any questions you have with your health care provider. Document Released: 01/31/2015 Document Revised: 09/24/2015 Document Reviewed: 11/05/2014 Elsevier Interactive Patient Education  2017 [employer].  Fall Prevention in the Home Falls can cause injuries. They can happen to people of all ages. There are many things you can do to make your home safe and to help prevent falls. What can I do on the outside of my home? Regularly fix the edges of walkways and driveways and fix any cracks. Remove anything that might make you trip as you walk through a door, such as a raised step or threshold. Trim any bushes or trees on the path to your home. Use bright outdoor lighting. Clear any walking paths of anything that might make someone trip, such as rocks or tools. Regularly check to see if handrails are loose or broken. Make sure that both sides of any steps have handrails. Any raised decks and porches should have guardrails on the edges. Have any leaves, snow, or ice cleared regularly. Use sand or salt on walking paths during winter. Clean up any spills in your garage right away. This includes oil or grease spills. What can I do in the bathroom? Use night lights. Install grab bars by the toilet and in the tub and shower. Do not use towel bars as grab bars. Use non-skid mats or decals in the tub or shower. If you need to sit down in the shower, use a plastic, non-slip stool. Keep the floor dry. Clean up any water that spills on the floor as soon as it happens. Remove soap buildup in the tub or shower regularly. Attach  bath mats securely with double-sided non-slip rug tape. Do not have throw rugs and other things on the floor that can make you trip. What can I do in the bedroom? Use night lights. Make sure that you have a light by your bed that is easy to reach. Do not use any sheets or blankets that are too big for your bed. They should not hang down onto the floor. Have a firm chair that has side arms. You can use this for support while you get dressed. Do not have throw rugs and other things on the floor that can make you trip. What can I do in the kitchen? Clean up any spills right away. Avoid walking on wet floors. Keep items that you use a lot in easy-to-reach places. If you need to reach something above you, use a strong step stool that has a grab bar. Keep electrical cords out of the way. Do not use floor polish or wax that makes floors slippery. If you must use wax, use non-skid floor wax. Do not have throw rugs and other things on the floor that can make you trip. What can I do with my stairs? Do not leave any items on the stairs. Make sure that there are handrails  on both sides of the stairs and use them. Fix handrails that are broken or loose. Make sure that handrails are as long as the stairways. Check any carpeting to make sure that it is firmly attached to the stairs. Fix any carpet that is loose or worn. Avoid having throw rugs at the top or bottom of the stairs. If you do have throw rugs, attach them to the floor with carpet tape. Make sure that you have a light switch at the top of the stairs and the bottom of the stairs. If you do not have them, ask someone to add them for you. What else can I do to help prevent falls? Wear shoes that: Do not have high heels. Have rubber bottoms. Are comfortable and fit you well. Are closed at the toe. Do not wear sandals. If you use a stepladder: Make sure that it is fully opened. Do not climb a closed stepladder. Make sure that both sides of the  stepladder are locked into place. Ask someone to hold it for you, if possible. Clearly mark and make sure that you can see: Any grab bars or handrails. First and last steps. Where the edge of each step is. Use tools that help you move around (mobility aids) if they are needed. These include: Canes. Walkers. Scooters. Crutches. Turn on the lights when you go into a dark area. Replace any light bulbs as soon as they burn out. Set up your furniture so you have a clear path. Avoid moving your furniture around. If any of your floors are uneven, fix them. If there are any pets around you, be aware of where they are. Review your medicines with your doctor. Some medicines can make you feel dizzy. This can increase your chance of falling. Ask your doctor what other things that you can do to help prevent falls. This information is not intended to replace advice given to you by your health care provider. Make sure you discuss any questions you have with your health care provider. Document Released: 10/31/2008 Document Revised: 06/12/2015 Document Reviewed: 02/08/2014 Elsevier Interactive Patient Education  2017 [employer].

## 2022-09-14 DIAGNOSIS — L4050 Arthropathic psoriasis, unspecified: Secondary | ICD-10-CM | POA: Diagnosis not present

## 2022-09-16 DIAGNOSIS — E669 Obesity, unspecified: Secondary | ICD-10-CM | POA: Diagnosis not present

## 2022-09-16 DIAGNOSIS — M1991 Primary osteoarthritis, unspecified site: Secondary | ICD-10-CM | POA: Diagnosis not present

## 2022-09-16 DIAGNOSIS — Z79899 Other long term (current) drug therapy: Secondary | ICD-10-CM | POA: Diagnosis not present

## 2022-09-16 DIAGNOSIS — L4050 Arthropathic psoriasis, unspecified: Secondary | ICD-10-CM | POA: Diagnosis not present

## 2022-09-16 DIAGNOSIS — L409 Psoriasis, unspecified: Secondary | ICD-10-CM | POA: Diagnosis not present

## 2022-09-16 DIAGNOSIS — Z6832 Body mass index (BMI) 32.0-32.9, adult: Secondary | ICD-10-CM | POA: Diagnosis not present

## 2022-10-05 DIAGNOSIS — B351 Tinea unguium: Secondary | ICD-10-CM | POA: Diagnosis not present

## 2022-11-05 DIAGNOSIS — Z23 Encounter for immunization: Secondary | ICD-10-CM | POA: Diagnosis not present

## 2022-11-16 DIAGNOSIS — L4050 Arthropathic psoriasis, unspecified: Secondary | ICD-10-CM | POA: Diagnosis not present

## 2022-12-24 DIAGNOSIS — H5319 Other subjective visual disturbances: Secondary | ICD-10-CM | POA: Diagnosis not present

## 2022-12-24 DIAGNOSIS — H10023 Other mucopurulent conjunctivitis, bilateral: Secondary | ICD-10-CM | POA: Diagnosis not present

## 2022-12-27 ENCOUNTER — Other Ambulatory Visit: Payer: Self-pay | Admitting: Family Medicine

## 2022-12-27 DIAGNOSIS — K219 Gastro-esophageal reflux disease without esophagitis: Secondary | ICD-10-CM

## 2022-12-28 ENCOUNTER — Ambulatory Visit (INDEPENDENT_AMBULATORY_CARE_PROVIDER_SITE_OTHER): Payer: Medicare Other | Admitting: Family Medicine

## 2022-12-28 ENCOUNTER — Encounter: Payer: Self-pay | Admitting: Family Medicine

## 2022-12-28 VITALS — BP 120/62 | HR 50 | Temp 98.1°F | Ht 70.0 in | Wt 226.2 lb

## 2022-12-28 DIAGNOSIS — H3322 Serous retinal detachment, left eye: Secondary | ICD-10-CM | POA: Insufficient documentation

## 2022-12-28 DIAGNOSIS — H2513 Age-related nuclear cataract, bilateral: Secondary | ICD-10-CM | POA: Diagnosis not present

## 2022-12-28 DIAGNOSIS — H31012 Macula scars of posterior pole (postinflammatory) (post-traumatic), left eye: Secondary | ICD-10-CM | POA: Diagnosis not present

## 2022-12-28 DIAGNOSIS — H43812 Vitreous degeneration, left eye: Secondary | ICD-10-CM | POA: Diagnosis not present

## 2022-12-28 DIAGNOSIS — H43821 Vitreomacular adhesion, right eye: Secondary | ICD-10-CM | POA: Diagnosis not present

## 2022-12-28 NOTE — Progress Notes (Signed)
Established Patient Office Visit   Subjective:  Patient ID: Anthony Greene, male    DOB: 26-Sep-1950  Age: 72 y.o. MRN: [id number]  Chief Complaint  Patient presents with   Eye Problem    Eye irritation and redness. Pt state he also has a dark floater in the left eye that comes and goes.     Eye Problem  Associated symptoms include blurred vision and eye redness. Pertinent negatives include no eye discharge, double vision, photophobia, tingling or weakness.   Encounter Diagnoses  Name Primary?   Left retinal detachment Yes   1 week history history of a large floater that intermittently obstructs his entire visual field.  He experienced a single vertical flash of light this past Saturday.  There there has been no headache or light sensitivity.  He was seen at the med clinic and started on polymyxin drops.  No discharge.  Mild AM matting.   Review of Systems  Constitutional: Negative.   HENT: Negative.    Eyes:  Positive for blurred vision and redness. Negative for double vision, photophobia, pain and discharge.  Respiratory: Negative.    Cardiovascular: Negative.   Gastrointestinal:  Negative for abdominal pain.  Genitourinary: Negative.   Musculoskeletal: Negative.  Negative for myalgias.  Skin:  Negative for rash.  Neurological:  Negative for tingling, loss of consciousness and weakness.  Endo/Heme/Allergies:  Negative for polydipsia.     Current Outpatient Medications:    celecoxib (CELEBREX) 200 MG capsule, Take 200 mg by mouth at bedtime. , Disp: , Rfl:    Golimumab (SIMPONI ARIA IV), Inject into the vein., Disp: , Rfl:    omeprazole (PRILOSEC) 40 MG capsule, TAKE 1 CAPSULE DAILY, Disp: 90 capsule, Rfl: 3   tamsulosin (FLOMAX) 0.4 MG CAPS capsule, TAKE 2 CAPSULES DAILY (NEED APPOINTMENT BEFORE NEXT REFILL), Disp: 180 capsule, Rfl: 3   ipratropium (ATROVENT) 0.03 % nasal spray, Place 2 sprays into both nostrils every 12 (twelve) hours., Disp: 30 mL, Rfl: 12   Objective:      BP 120/62   Pulse (!) 50   Temp 98.1 F (36.7 C)   Ht 5\' 10"  (1.778 m)   Wt 226 lb 3.2 oz (102.6 kg)   SpO2 95%   BMI 32.46 kg/m    Physical Exam Constitutional:      General: He is not in acute distress.    Appearance: Normal appearance. He is not ill-appearing, toxic-appearing or diaphoretic.  HENT:     Head: Normocephalic and atraumatic.     Right Ear: External ear normal.     Left Ear: External ear normal.  Eyes:     General: No scleral icterus.       Right eye: No discharge.        Left eye: No discharge.     Extraocular Movements: Extraocular movements intact.     Right eye: Normal extraocular motion and no nystagmus.     Left eye: Normal extraocular motion and no nystagmus.     Conjunctiva/sclera:     Right eye: Right conjunctiva is injected. No chemosis, exudate or hemorrhage.    Left eye: Left conjunctiva is injected. No chemosis, exudate or hemorrhage. Pulmonary:     Effort: Pulmonary effort is normal. No respiratory distress.  Skin:    General: Skin is warm and dry.  Neurological:     Mental Status: He is alert and oriented to person, place, and time.  Psychiatric:        Mood and Affect: Mood  normal.        Behavior: Behavior normal.      No results found for any visits on 12/28/22.    The 10-year ASCVD risk score (Arnett DK, et al., 2019) is: 17.2%    Assessment & Plan:   Left retinal detachment -     Ambulatory referral to Ophthalmology    No follow-ups on file.  Reminded patient that he is overdue for physical.  Urgent referral to ophthalmology.  Mliss Sax, MD

## 2022-12-31 ENCOUNTER — Ambulatory Visit: Payer: Medicare Other | Admitting: Family Medicine

## 2023-01-13 DIAGNOSIS — Z79899 Other long term (current) drug therapy: Secondary | ICD-10-CM | POA: Diagnosis not present

## 2023-01-13 DIAGNOSIS — Z111 Encounter for screening for respiratory tuberculosis: Secondary | ICD-10-CM | POA: Diagnosis not present

## 2023-01-13 DIAGNOSIS — L4050 Arthropathic psoriasis, unspecified: Secondary | ICD-10-CM | POA: Diagnosis not present

## 2023-01-13 DIAGNOSIS — R5383 Other fatigue: Secondary | ICD-10-CM | POA: Diagnosis not present

## 2023-03-07 DIAGNOSIS — H2513 Age-related nuclear cataract, bilateral: Secondary | ICD-10-CM | POA: Diagnosis not present

## 2023-03-07 DIAGNOSIS — H43821 Vitreomacular adhesion, right eye: Secondary | ICD-10-CM | POA: Diagnosis not present

## 2023-03-07 DIAGNOSIS — H43812 Vitreous degeneration, left eye: Secondary | ICD-10-CM | POA: Diagnosis not present

## 2023-03-07 DIAGNOSIS — H31012 Macula scars of posterior pole (postinflammatory) (post-traumatic), left eye: Secondary | ICD-10-CM | POA: Diagnosis not present

## 2023-03-16 DIAGNOSIS — L4050 Arthropathic psoriasis, unspecified: Secondary | ICD-10-CM | POA: Diagnosis not present

## 2023-03-18 ENCOUNTER — Ambulatory Visit (INDEPENDENT_AMBULATORY_CARE_PROVIDER_SITE_OTHER): Payer: Medicare Other | Admitting: Family Medicine

## 2023-03-18 ENCOUNTER — Encounter: Payer: Self-pay | Admitting: Family Medicine

## 2023-03-18 VITALS — BP 140/70 | HR 58 | Temp 97.9°F | Ht 70.0 in | Wt 224.4 lb

## 2023-03-18 DIAGNOSIS — N401 Enlarged prostate with lower urinary tract symptoms: Secondary | ICD-10-CM | POA: Diagnosis not present

## 2023-03-18 DIAGNOSIS — R972 Elevated prostate specific antigen [PSA]: Secondary | ICD-10-CM | POA: Diagnosis not present

## 2023-03-18 DIAGNOSIS — M65332 Trigger finger, left middle finger: Secondary | ICD-10-CM

## 2023-03-18 DIAGNOSIS — R351 Nocturia: Secondary | ICD-10-CM | POA: Diagnosis not present

## 2023-03-18 DIAGNOSIS — R030 Elevated blood-pressure reading, without diagnosis of hypertension: Secondary | ICD-10-CM | POA: Diagnosis not present

## 2023-03-18 DIAGNOSIS — E7800 Pure hypercholesterolemia, unspecified: Secondary | ICD-10-CM

## 2023-03-18 DIAGNOSIS — Z131 Encounter for screening for diabetes mellitus: Secondary | ICD-10-CM | POA: Insufficient documentation

## 2023-03-18 DIAGNOSIS — Z0000 Encounter for general adult medical examination without abnormal findings: Secondary | ICD-10-CM

## 2023-03-18 DIAGNOSIS — Z23 Encounter for immunization: Secondary | ICD-10-CM

## 2023-03-18 LAB — URINALYSIS, ROUTINE W REFLEX MICROSCOPIC
Bilirubin Urine: NEGATIVE
Hgb urine dipstick: NEGATIVE
Ketones, ur: NEGATIVE
Leukocytes,Ua: NEGATIVE
Nitrite: NEGATIVE
Specific Gravity, Urine: <=1.005 — AB (ref 1.000–1.030)
Total Protein, Urine: NEGATIVE
Urine Glucose: NEGATIVE
Urobilinogen, UA: 0.2 (ref 0.0–1.0)
WBC, UA: NONE SEEN — AB (ref 0–?)
pH: 6.5 (ref 5.0–8.0)

## 2023-03-18 LAB — HEMOGLOBIN A1C: Hgb A1c MFr Bld: 5.2 % (ref 4.6–6.5)

## 2023-03-18 LAB — COMPREHENSIVE METABOLIC PANEL
ALT: 16 U/L (ref 0–53)
AST: 18 U/L (ref 0–37)
Albumin: 4 g/dL (ref 3.5–5.2)
Alkaline Phosphatase: 52 U/L (ref 39–117)
BUN: 16 mg/dL (ref 6–23)
CO2: 29 meq/L (ref 19–32)
Calcium: 8.8 mg/dL (ref 8.4–10.5)
Chloride: 106 meq/L (ref 96–112)
Creatinine, Ser: 0.91 mg/dL (ref 0.40–1.50)
GFR: 83.54 mL/min (ref >60.00)
Glucose, Bld: 105 mg/dL — ABNORMAL HIGH (ref 70–99)
Potassium: 4.1 meq/L (ref 3.5–5.1)
Sodium: 139 meq/L (ref 135–145)
Total Bilirubin: 0.7 mg/dL (ref 0.2–1.2)
Total Protein: 6.3 g/dL (ref 6.0–8.3)

## 2023-03-18 LAB — CBC
HCT: 44.4 % (ref 39.0–52.0)
Hemoglobin: 14.9 g/dL (ref 13.0–17.0)
MCHC: 33.5 g/dL (ref 30.0–36.0)
MCV: 93.5 fl (ref 78.0–100.0)
Platelets: 186 10*3/uL (ref 150.0–400.0)
RBC: 4.75 Mil/uL (ref 4.22–5.81)
RDW: 13.5 % (ref 11.5–15.5)
WBC: 4.1 10*3/uL (ref 4.0–10.5)

## 2023-03-18 LAB — LIPID PANEL
Cholesterol: 175 mg/dL (ref 0–200)
HDL: 72.9 mg/dL (ref >39.00)
LDL Cholesterol: 90 mg/dL (ref 0–99)
NonHDL: 102.15
Total CHOL/HDL Ratio: 2
Triglycerides: 62 mg/dL (ref 0.0–149.0)
VLDL: 12.4 mg/dL (ref 0.0–40.0)

## 2023-03-18 LAB — PSA: PSA: 4.12 ng/mL — ABNORMAL HIGH (ref 0.10–4.00)

## 2023-03-18 NOTE — Progress Notes (Signed)
 Established Patient Office Visit   Subjective:  Patient ID: Anthony Greene, male    DOB: [birth year]  Age: 73 y.o. MRN: [id number]  No chief complaint on file.   HPI Encounter Diagnoses  Name Primary?   Healthcare maintenance Yes   Elevated LDL cholesterol level    Elevated PSA    Elevated BP without diagnosis of hypertension    Benign prostatic hyperplasia with nocturia    Screening for diabetes mellitus    Immunization due    Trigger middle finger of left hand    Here for physical.  Exercising regularly on the elliptical for 30 minutes daily.  Has regular dental care.  Bowel movements have been okay no issues there.  Decrease in force of urine stream especially after drinking sodas.  0.8 tamsulosin is not helping much.  That will finger gets stuck in the flexed position.   Review of Systems  Constitutional: Negative.   HENT: Negative.    Eyes:  Negative for blurred vision, discharge and redness.  Respiratory: Negative.    Cardiovascular: Negative.   Gastrointestinal:  Negative for abdominal pain.  Genitourinary: Negative.   Musculoskeletal: Negative.  Negative for myalgias.  Skin:  Negative for rash.  Neurological:  Negative for tingling, loss of consciousness and weakness.  Endo/Heme/Allergies:  Negative for polydipsia.     Current Outpatient Medications:    celecoxib (CELEBREX) 200 MG capsule, Take 200 mg by mouth at bedtime. , Disp: , Rfl:    Golimumab (SIMPONI ARIA IV), Inject into the vein., Disp: , Rfl:    omeprazole (PRILOSEC) 40 MG capsule, TAKE 1 CAPSULE DAILY, Disp: 90 capsule, Rfl: 3   tamsulosin (FLOMAX) 0.4 MG CAPS capsule, TAKE 2 CAPSULES DAILY (NEED APPOINTMENT BEFORE NEXT REFILL), Disp: 180 capsule, Rfl: 3   Objective:     BP (!) 140/70   Pulse (!) 58   Temp 97.9 F (36.6 C)   Ht 5\' 10"  (1.778 m)   Wt 224 lb 6.4 oz (101.8 kg)   SpO2 98%   BMI 32.20 kg/m    Physical Exam Constitutional:      General: He is not in acute distress.     Appearance: Normal appearance. He is not ill-appearing, toxic-appearing or diaphoretic.  HENT:     Head: Normocephalic and atraumatic.     Right Ear: Tympanic membrane, ear canal and external ear normal.     Left Ear: Tympanic membrane, ear canal and external ear normal.     Mouth/Throat:     Mouth: Mucous membranes are moist.     Pharynx: Oropharynx is clear. No oropharyngeal exudate or posterior oropharyngeal erythema.  Eyes:     General: No scleral icterus.       Right eye: No discharge.        Left eye: No discharge.     Extraocular Movements: Extraocular movements intact.     Conjunctiva/sclera: Conjunctivae normal.     Pupils: Pupils are equal, round, and reactive to light.  Cardiovascular:     Rate and Rhythm: Normal rate and regular rhythm.  Pulmonary:     Effort: Pulmonary effort is normal. No respiratory distress.     Breath sounds: Normal breath sounds.  Abdominal:     General: Bowel sounds are normal.     Tenderness: There is no abdominal tenderness. There is no guarding or rebound.     Hernia: A hernia is present. Hernia is present in the ventral area.  Genitourinary:   Musculoskeletal:  Arms:     Cervical back: No rigidity or tenderness.  Skin:    General: Skin is warm and dry.  Neurological:     Mental Status: He is alert and oriented to person, place, and time.  Psychiatric:        Mood and Affect: Mood normal.        Behavior: Behavior normal.      No results found for any visits on 03/18/23.    The 10-year ASCVD risk score (Arnett DK, et al., 2019) is: 21.9%    Assessment & Plan:   Healthcare maintenance  Elevated LDL cholesterol level -     Comprehensive metabolic panel -     Lipid panel  Elevated PSA -     PSA  Elevated BP without diagnosis of hypertension -     CBC -     Comprehensive metabolic panel -     Urinalysis, Routine w reflex microscopic  Benign prostatic hyperplasia with nocturia -     PSA  Screening for diabetes  mellitus -     Hemoglobin A1c  Immunization due -     Pneumococcal conjugate vaccine 20-valent  Trigger middle finger of left hand    Return in about 3 months (around 06/15/2023), or if symptoms worsen or fail to improve.  Information was given on health maintenance and disease mention.  Permission given on preventing hypertension.  Will check and record blood pressures.  Rechecking PSA.  It had been elevated in the past.  Briefly discussed finasteride therapy in addition to the tamsulosin.  May actually need urology follow-up with further increases in the PSA.  Will let me know if trigger finger becomes more symptomatic.  Mliss Sax, MD

## 2023-03-21 NOTE — Addendum Note (Signed)
 Addended by: Andrez Grime on: 03/21/2023 07:57 AM   Modules accepted: Orders

## 2023-03-28 DIAGNOSIS — D225 Melanocytic nevi of trunk: Secondary | ICD-10-CM | POA: Diagnosis not present

## 2023-03-28 DIAGNOSIS — D235 Other benign neoplasm of skin of trunk: Secondary | ICD-10-CM | POA: Diagnosis not present

## 2023-03-28 DIAGNOSIS — C44319 Basal cell carcinoma of skin of other parts of face: Secondary | ICD-10-CM | POA: Diagnosis not present

## 2023-03-28 DIAGNOSIS — L570 Actinic keratosis: Secondary | ICD-10-CM | POA: Diagnosis not present

## 2023-03-28 DIAGNOSIS — L821 Other seborrheic keratosis: Secondary | ICD-10-CM | POA: Diagnosis not present

## 2023-03-28 DIAGNOSIS — L814 Other melanin hyperpigmentation: Secondary | ICD-10-CM | POA: Diagnosis not present

## 2023-03-28 DIAGNOSIS — D0461 Carcinoma in situ of skin of right upper limb, including shoulder: Secondary | ICD-10-CM | POA: Diagnosis not present

## 2023-03-28 DIAGNOSIS — Z85828 Personal history of other malignant neoplasm of skin: Secondary | ICD-10-CM | POA: Diagnosis not present

## 2023-04-18 ENCOUNTER — Ambulatory Visit: Admitting: Urology

## 2023-04-24 NOTE — Progress Notes (Signed)
 Chief Complaint: No chief complaint on file.   History of Present Illness:  Anthony Greene is a 73 y.o. male who is seen in consultation from Mliss Sax, MD for evaluation of elevated PSA.  PSA levels have been as follows: 12.5.2019--5.83 1.10.2020--3.52 2.8.2021--4.73 8.18.2022--3.9 9.6.2023--5.28 2.28.2025--4.12  He also has BPH symptoms.  He has been on tamsulosin for approximately 2 years.  This has been increased to 2 capsules daily recently.  He has not had significant improvement of his symptoms.  He does have some mild orthostatic symptoms in the morning.  He denies any family history of prostate cancer or of large prostate. .   Past Medical History:  Past Medical History:  Diagnosis Date   Arthritis    Closed fracture of transverse process of lumbar vertebra (HCC) 06/06/2017   Hematoma of hip, right, initial encounter 06/23/2017   Injury resulting from fall from height 06/07/2017   Intractable low back pain 06/07/2017   Neuropathy    Osteoarthritis    Pilonidal abscess 06/22/2017   Pilonidal cyst 06/22/2017   Psoriasis    Psoriatic arthritis (HCC)    Sebaceous cyst     Past Surgical History:  Past Surgical History:  Procedure Laterality Date   CARPAL TUNNEL RELEASE Right    COLONOSCOPY WITH PROPOFOL     DRESSING CHANGE UNDER ANESTHESIA N/A 06/25/2017   Procedure: DRESSING CHANGE UNDER ANESTHESIA;  Surgeon: Lattie Haw, MD;  Location: [facility];  Service: General;  Laterality: N/A;   IRRIGATION AND DEBRIDEMENT BUTTOCKS Right 06/23/2017   Procedure: IRRIGATION AND DEBRIDEMENT BUTTOCKS;  Surgeon: Ancil Linsey, MD;  Location: [facility];  Service: General;  Laterality: Right;   JOINT REPLACEMENT     BL knee   KNEE ARTHROSCOPY Right    LAMINOTOMY  2023   PILONIDAL CYST EXCISION N/A 01/20/2018   Procedure: EXCISION OF INCREASINGLY SYMPTOMATIC PILONIDAL CYST AND RIGHT OF UPPER BACK SEBACEOUS CYST;  Surgeon: Ancil Linsey, MD;  Location:  [facility];  Service: General;  Laterality: N/A;   TONSILLECTOMY      Allergies:  Allergies  Allergen Reactions   Dilaudid [Hydromorphone Hcl] Nausea And Vomiting   Morphine And Codeine Nausea And Vomiting   Vancomycin Other (See Comments)    redman syndrome   Penicillins Rash    Patient has only had rash reaction to PCN in the past.  Patient and wife state he Tolerates keflex and  Ancef with no problem.  Has patient had a PCN reaction causing immediate rash, facial/tongue/throat swelling, SOB or lightheadedness with hypotension: Yes- rash only Has patient had a PCN reaction causing severe rash involving mucus membranes or skin necrosis: No Has patient had a PCN reaction that required hospitalization: No Has patient had a PCN reaction occurring within the last 10 years: No    Family History:  Family History  Problem Relation Age of Onset   Asthma Mother    COPD Mother    Stroke Father    Heart disease Father    Colon cancer Brother    Cancer Brother    Colon polyps Neg Hx    Esophageal cancer Neg Hx    Stomach cancer Neg Hx    Rectal cancer Neg Hx     Social History:  Social History   Tobacco Use   Smoking status: Former    Types: Cigars   Smokeless tobacco: Never  Vaping Use   Vaping status: Never Used  Substance Use Topics   Alcohol use:  Yes    Comment: 1-3 alcoholic drinks daily   Drug use: Never    Review of symptoms:  Constitutional:  Negative for unexplained weight loss, night sweats, fever, chills ENT:  Negative for nose bleeds, sinus pain, painful swallowing CV:  Negative for chest pain, shortness of breath, exercise intolerance, palpitations, loss of consciousness Resp:  Negative for cough, wheezing, shortness of breath GI:  Negative for nausea, vomiting, diarrhea, bloody stools GU:  Positives noted in HPI;  Neuro:  Negative for seizures, poor balance, limb weakness, slurred speech Psych:  Negative for lack of energy, depression, anxiety Endocrine:   Negative for polydipsia, polyuria, symptoms of hypoglycemia (dizziness, hunger, sweating) Hematologic:  Negative for anemia, purpura, petechia, prolonged or excessive bleeding, use of anticoagulants  Allergic:  Negative for difficulty breathing or choking as a result of exposure to anything; no shellfish allergy; no allergic response (rash/itch) to materials, foods  Physical exam: There were no vitals taken for this visit. GENERAL APPEARANCE:  Well appearing, well developed, well nourished, NAD HEENT: Atraumatic, Normocephalic. NECK: Normal appearance LUNGS: Normal inspiratory and expiratory excursion HEART: Regular Rate ABDOMEN: Right inguinal hernia noted. GU: Phallus normal, no lesions. Scrotal skin normal. Testicles/epididymal structures normal. Meatus normal. Normal anal sphincter tone, prostate 80 mL, symmetric, non nodular, non tender. EXTREMITIES: Moves all extremities well.  Without clubbing, cyanosis, or edema. NEUROLOGIC:  Alert and oriented x 3, normal gait, CN II-XII grossly intact.  MENTAL STATUS:  Appropriate. SKIN:  Warm, dry and intact.    Results: No results found for this or any previous visit (from the past 24 hours).  I have reviewed referring/prior physicians notes  I have reviewed urinalysis  I have reviewed PSA results  I have reviewed prior imaging--pelvic CT scan from 2019 reviewed for prostatic size/appearance.  Size somewhere between 55 and 68 mL.  Central calcifications present.  I have reviewed urine culture results  Assessment: 1.  BPH with symptoms.  6 years ago prostate measured up to 68 mL.  I feel it is significantly larger than that now.  It does not feel suspicious for cancer  2.  Elevated PSA.  I think with his PSA 4.1, his age of 37 and him having a very large but benign feeling gland, I do not think we need to perform further diagnostic testing at this point.   Plan: 1.  I spoke with him about further treatment options-adding finasteride,  minimally invasive therapy such as UroLift and Rezum, as well as HoLEP and TURP.  I would recommend the former.  He agrees with this.  He will start finasteride and back down to 1 tamsulosin a day.  2.  I will have him come back in 3 months to recheck symptoms

## 2023-04-25 ENCOUNTER — Ambulatory Visit (INDEPENDENT_AMBULATORY_CARE_PROVIDER_SITE_OTHER): Admitting: Urology

## 2023-04-25 ENCOUNTER — Encounter: Payer: Self-pay | Admitting: Urology

## 2023-04-25 VITALS — BP 151/76 | HR 77 | Ht 70.0 in | Wt 220.0 lb

## 2023-04-25 DIAGNOSIS — Z6831 Body mass index (BMI) 31.0-31.9, adult: Secondary | ICD-10-CM | POA: Diagnosis not present

## 2023-04-25 DIAGNOSIS — Z79899 Other long term (current) drug therapy: Secondary | ICD-10-CM | POA: Diagnosis not present

## 2023-04-25 DIAGNOSIS — E669 Obesity, unspecified: Secondary | ICD-10-CM | POA: Diagnosis not present

## 2023-04-25 DIAGNOSIS — M1991 Primary osteoarthritis, unspecified site: Secondary | ICD-10-CM | POA: Diagnosis not present

## 2023-04-25 DIAGNOSIS — L409 Psoriasis, unspecified: Secondary | ICD-10-CM | POA: Diagnosis not present

## 2023-04-25 DIAGNOSIS — R39198 Other difficulties with micturition: Secondary | ICD-10-CM

## 2023-04-25 DIAGNOSIS — L4050 Arthropathic psoriasis, unspecified: Secondary | ICD-10-CM | POA: Diagnosis not present

## 2023-04-25 DIAGNOSIS — N401 Enlarged prostate with lower urinary tract symptoms: Secondary | ICD-10-CM

## 2023-04-25 DIAGNOSIS — R972 Elevated prostate specific antigen [PSA]: Secondary | ICD-10-CM

## 2023-04-25 LAB — BLADDER SCAN AMB NON-IMAGING

## 2023-04-25 MED ORDER — FINASTERIDE 5 MG PO TABS: 5.0000 mg | ORAL_TABLET | Freq: Every day | ORAL | 3 refills | Status: AC

## 2023-04-25 NOTE — Addendum Note (Signed)
 Addended by: Carolin Coy on: 04/25/2023 03:35 PM   Modules accepted: Orders

## 2023-05-11 DIAGNOSIS — L4050 Arthropathic psoriasis, unspecified: Secondary | ICD-10-CM | POA: Diagnosis not present

## 2023-05-12 DIAGNOSIS — Z85828 Personal history of other malignant neoplasm of skin: Secondary | ICD-10-CM | POA: Diagnosis not present

## 2023-05-12 DIAGNOSIS — C44319 Basal cell carcinoma of skin of other parts of face: Secondary | ICD-10-CM | POA: Diagnosis not present

## 2023-06-27 ENCOUNTER — Other Ambulatory Visit: Payer: Self-pay | Admitting: Family Medicine

## 2023-06-27 DIAGNOSIS — N401 Enlarged prostate with lower urinary tract symptoms: Secondary | ICD-10-CM

## 2023-07-06 DIAGNOSIS — Z79899 Other long term (current) drug therapy: Secondary | ICD-10-CM | POA: Diagnosis not present

## 2023-07-06 DIAGNOSIS — L4050 Arthropathic psoriasis, unspecified: Secondary | ICD-10-CM | POA: Diagnosis not present

## 2023-07-25 ENCOUNTER — Ambulatory Visit: Admitting: Urology

## 2023-08-15 ENCOUNTER — Ambulatory Visit: Admitting: Urology

## 2023-08-15 NOTE — Progress Notes (Signed)
 Assessment: 1.  BPH with symptoms.  6 years ago prostate measured up to 68 mL.  Symptoms significantly improved with adding finasteride , backing down to 1 tamsulosin  a day  2.  Elevated PSA.  I think with his PSA 4.1, his age of 83 and him having a very large but benign feeling gland, I do not think we need to perform further diagnostic testing at this point.   Plan: 1.  Continue on the finasteride  and tamsulosin .  He is satisfied with this combination and has minimal side effects  2.  He will be seeing his new PCP, Vernell Fort starting tomorrow.  He would like just to follow-up with her down the road.  I did discuss the fact that he is on finasteride , so PSA should be doubled to correct for the finasteride    History of Present Illness:  4.7.2025: Initial visit for evaluation of elevated PSA.  PSA levels have been as follows: 12.5.2019--5.83 1.10.2020--3.52 2.8.2021--4.73 8.18.2022--3.9 9.6.2023--5.28 2.28.2025--4.12  He also has BPH symptoms.  He has been on tamsulosin  for approximately 2 years.  This has been increased to 2 capsules daily recently.  He has not had significant improvement of his symptoms.  He does have some mild orthostatic symptoms in the morning.  He was started on finasteride , and his tamsulosin  was decreased to 1/day.  8.4.2025: Past Medical History:  Past Medical History:  Diagnosis Date   Arthritis    Closed fracture of transverse process of lumbar vertebra (HCC) 06/06/2017   Hematoma of hip, right, initial encounter 06/23/2017   Injury resulting from fall from height 06/07/2017   Intractable low back pain 06/07/2017   Neuropathy    Osteoarthritis    Pilonidal abscess 06/22/2017   Pilonidal cyst 06/22/2017   Psoriasis    Psoriatic arthritis (HCC)    Sebaceous cyst     Past Surgical History:  Past Surgical History:  Procedure Laterality Date   CARPAL TUNNEL RELEASE Right    COLONOSCOPY WITH PROPOFOL      DRESSING CHANGE UNDER ANESTHESIA N/A  06/25/2017   Procedure: DRESSING CHANGE UNDER ANESTHESIA;  Surgeon: Wonda Charlie BRAVO, MD;  Location: [facility];  Service: General;  Laterality: N/A;   IRRIGATION AND DEBRIDEMENT BUTTOCKS Right 06/23/2017   Procedure: IRRIGATION AND DEBRIDEMENT BUTTOCKS;  Surgeon: Nicholaus Selinda Birmingham, MD;  Location: [facility];  Service: General;  Laterality: Right;   JOINT REPLACEMENT     BL knee   KNEE ARTHROSCOPY Right    LAMINOTOMY  2023   PILONIDAL CYST EXCISION N/A 01/20/2018   Procedure: EXCISION OF INCREASINGLY SYMPTOMATIC PILONIDAL CYST AND RIGHT OF UPPER BACK SEBACEOUS CYST;  Surgeon: Nicholaus Selinda Birmingham, MD;  Location: [facility];  Service: General;  Laterality: N/A;   TONSILLECTOMY      Allergies:  Allergies  Allergen Reactions   Dilaudid  [Hydromorphone  Hcl] Nausea And Vomiting   Morphine  And Codeine Nausea And Vomiting   Vancomycin  Other (See Comments)    redman syndrome   Penicillins Rash    Patient has only had rash reaction to PCN in the past.  Patient and wife state he Tolerates keflex and  Ancef  with no problem.  Has patient had a PCN reaction causing immediate rash, facial/tongue/throat swelling, SOB or lightheadedness with hypotension: Yes- rash only Has patient had a PCN reaction causing severe rash involving mucus membranes or skin necrosis: No Has patient had a PCN reaction that required hospitalization: No Has patient had a PCN reaction occurring within the last 10 years: No  Family History:  Family History  Problem Relation Age of Onset   Asthma Mother    COPD Mother    Stroke Father    Heart disease Father    Colon cancer Brother    Cancer Brother    Colon polyps Neg Hx    Esophageal cancer Neg Hx    Stomach cancer Neg Hx    Rectal cancer Neg Hx     Social History:  Social History   Tobacco Use   Smoking status: Former    Types: Cigars   Smokeless tobacco: Never  Vaping Use   Vaping status: Never Used  Substance Use Topics   Alcohol use: Yes    Comment: 1-3  alcoholic drinks daily   Drug use: Never    Review of symptoms:  Constitutional:  Negative for unexplained weight loss, night sweats, fever, chills ENT:  Negative for nose bleeds, sinus pain, painful swallowing CV:  Negative for chest pain, shortness of breath, exercise intolerance, palpitations, loss of consciousness Resp:  Negative for cough, wheezing, shortness of breath GI:  Negative for nausea, vomiting, diarrhea, bloody stools GU:  Positives noted in HPI;  Neuro:  Negative for seizures, poor balance, limb weakness, slurred speech Psych:  Negative for lack of energy, depression, anxiety Endocrine:  Negative for polydipsia, polyuria, symptoms of hypoglycemia (dizziness, hunger, sweating) Hematologic:  Negative for anemia, purpura, petechia, prolonged or excessive bleeding, use of anticoagulants  Allergic:  Negative for difficulty breathing or choking as a result of exposure to anything; no shellfish allergy; no allergic response (rash/itch) to materials, foods    Results: No results found for this or any previous visit (from the past 24 hours).  I have reviewed referring/prior physicians notes  I have reviewed urinalysis  I have reviewed PSA results  I have reviewed prior imaging--pelvic CT scan from 2019 reviewed for prostatic size/appearance.  Size somewhere between 55 and 68 mL.  Central calcifications present.  I have reviewed urine culture results

## 2023-08-22 ENCOUNTER — Encounter: Payer: Self-pay | Admitting: Urology

## 2023-08-22 ENCOUNTER — Ambulatory Visit (INDEPENDENT_AMBULATORY_CARE_PROVIDER_SITE_OTHER): Admitting: Urology

## 2023-08-22 ENCOUNTER — Ambulatory Visit (INDEPENDENT_AMBULATORY_CARE_PROVIDER_SITE_OTHER): Payer: Medicare Other

## 2023-08-22 VITALS — BP 161/80 | HR 77 | Ht 70.0 in | Wt 216.0 lb

## 2023-08-22 DIAGNOSIS — Z0000 Encounter for general adult medical examination without abnormal findings: Secondary | ICD-10-CM | POA: Diagnosis not present

## 2023-08-22 DIAGNOSIS — R39198 Other difficulties with micturition: Secondary | ICD-10-CM

## 2023-08-22 DIAGNOSIS — N401 Enlarged prostate with lower urinary tract symptoms: Secondary | ICD-10-CM | POA: Diagnosis not present

## 2023-08-22 DIAGNOSIS — R972 Elevated prostate specific antigen [PSA]: Secondary | ICD-10-CM

## 2023-08-22 DIAGNOSIS — R399 Unspecified symptoms and signs involving the genitourinary system: Secondary | ICD-10-CM | POA: Diagnosis not present

## 2023-08-22 DIAGNOSIS — N138 Other obstructive and reflux uropathy: Secondary | ICD-10-CM

## 2023-08-22 LAB — URINALYSIS, ROUTINE W REFLEX MICROSCOPIC
Bilirubin, UA: NEGATIVE
Glucose, UA: NEGATIVE
Ketones, UA: NEGATIVE
Leukocytes,UA: NEGATIVE
Nitrite, UA: NEGATIVE
Protein,UA: NEGATIVE
RBC, UA: NEGATIVE
Specific Gravity, UA: 1.01 (ref 1.005–1.030)
Urobilinogen, Ur: 0.2 mg/dL (ref 0.2–1.0)
pH, UA: 5.5 (ref 5.0–7.5)

## 2023-08-22 LAB — BLADDER SCAN AMB NON-IMAGING: Scan Result: 72

## 2023-08-22 MED ORDER — FINASTERIDE 5 MG PO TABS: 5.0000 mg | ORAL_TABLET | Freq: Every day | ORAL | 3 refills | Status: DC

## 2023-08-22 NOTE — Progress Notes (Signed)
 Subjective:   Anthony Greene is a 73 y.o. who presents for a Medicare Wellness preventive visit.  As a reminder, Annual Wellness Visits don't include a physical exam, and some assessments may be limited, especially if this visit is performed virtually. We may recommend an in-person follow-up visit with your provider if needed.  Visit Complete: Virtual I connected with  Luke Kung on 08/22/23 by a audio enabled telemedicine application and verified that I am speaking with the correct person using two identifiers.  Patient Location: Home  Provider Location: Office/Clinic  I discussed the limitations of evaluation and management by telemedicine. The patient expressed understanding and agreed to proceed.  Vital Signs: Because this visit was a virtual/telehealth visit, some criteria may be missing or patient reported. Any vitals not documented were not able to be obtained and vitals that have been documented are patient reported.  VideoError- Librarian, academic were attempted between this provider and patient, however failed, due to patient having technical difficulties OR patient did not have access to video capability.  We continued and completed visit with audio only.   Persons Participating in Visit: Patient.  AWV Questionnaire: Yes: Patient Medicare AWV questionnaire was completed by the patient on 08/18/2023; I have confirmed that all information answered by patient is correct and no changes since this date.  Cardiac Risk Factors include: advanced age (>63men, >13 women);male gender     Objective:    Today's Vitals   There is no height or weight on file to calculate BMI.     08/22/2023    8:11 AM 08/20/2022    8:44 AM 08/24/2021    2:19 PM 08/18/2021    9:53 AM 08/12/2020    9:56 AM 04/18/2019    8:53 AM 01/09/2018    8:37 AM  Advanced Directives  Does Patient Have a Medical Advance Directive? Yes Yes No Yes Yes Yes Yes   Type of Special educational needs teacher of [place];Living will Healthcare Power of [place];Living will  Healthcare Power of [place];Living will Healthcare Power of [place];Living will Healthcare Power of [place];Living will Healthcare Power of [place];Living will  Does patient want to make changes to medical advance directive?   No - Patient declined   No - Patient declined   Copy of Healthcare Power of Attorney in Chart? No - copy requested No - copy requested  No - copy requested Yes - validated most recent copy scanned in chart (See row information) No - copy requested No - copy requested      Data saved with a previous flowsheet row definition    Current Medications (verified) Outpatient Encounter Medications as of 08/22/2023  Medication Sig   celecoxib (CELEBREX) 200 MG capsule Take 200 mg by mouth at bedtime.    finasteride  (PROSCAR ) 5 MG tablet Take 1 tablet (5 mg total) by mouth daily.   Golimumab  (SIMPONI  ARIA IV) Inject into the vein.   omeprazole  (PRILOSEC) 40 MG capsule TAKE 1 CAPSULE DAILY   tamsulosin  (FLOMAX ) 0.4 MG CAPS capsule TAKE 2 CAPSULES DAILY (NEED APPOINTMENT BEFORE NEXT REFILL)   No facility-administered encounter medications on file as of 08/22/2023.    Allergies (verified) Dilaudid  [hydromorphone  hcl], Morphine  and codeine, Vancomycin , and Penicillins   History: Past Medical History:  Diagnosis Date   Arthritis    Closed fracture of transverse process of lumbar vertebra (HCC) 06/06/2017   Hematoma of hip, right, initial encounter 06/23/2017   Injury resulting from fall from height 06/07/2017   Intractable low  back pain 06/07/2017   Neuropathy    Osteoarthritis    Pilonidal abscess 06/22/2017   Pilonidal cyst 06/22/2017   Psoriasis    Psoriatic arthritis (HCC)    Sebaceous cyst    Past Surgical History:  Procedure Laterality Date   CARPAL TUNNEL RELEASE Right    COLONOSCOPY WITH PROPOFOL      DRESSING CHANGE UNDER ANESTHESIA N/A 06/25/2017   Procedure: DRESSING CHANGE UNDER  ANESTHESIA;  Surgeon: Wonda Charlie BRAVO, MD;  Location: [facility] ORS;  Service: General;  Laterality: N/A;   IRRIGATION AND DEBRIDEMENT BUTTOCKS Right 06/23/2017   Procedure: IRRIGATION AND DEBRIDEMENT BUTTOCKS;  Surgeon: Nicholaus Selinda Birmingham, MD;  Location: [facility] OR[facility];  Service: General;  Laterality: Right;   JOINT REPLACEMENT     BL knee   KNEE ARTHROSCOPY Right    LAMINOTOMY  2023   PILONIDAL CYST EXCISION N/A 01/20/2018   Procedure: EXCISION OF INCREASINGLY SYMPTOMATIC PILONIDAL CYST AND RIGHT OF UPPER BACK SEBACEOUS CYST;  Surgeon: Nicholaus Selinda Birmingham, MD;  Location: [facility] OR[facility];  Service: General;  Laterality: N/A;   TONSILLECTOMY     Family History  Problem Relation Age of Onset   Asthma Mother    COPD Mother    Stroke Father    Heart disease Father    Colon cancer Brother    Cancer Brother    Colon polyps Neg Hx    Esophageal cancer Neg Hx    Stomach cancer Neg Hx    Rectal cancer Neg Hx    Social History   Socioeconomic History   Marital status: Married    Spouse name: Not on file   Number of children: Not on file   Years of education: Not on file   Highest education level: Bachelor's degree (e.g., BA, AB, BS)  Occupational History   Not on file  Tobacco Use   Smoking status: Former    Types: Cigars   Smokeless tobacco: Never  Vaping Use   Vaping status: Never Used  Substance and Sexual Activity   Alcohol use: Yes    Comment: 1-3 alcoholic drinks daily   Drug use: Never   Sexual activity: Yes  Other Topics Concern   Not on file  Social History Narrative   Not on file   Social Drivers of Health   Financial Resource Strain: Low Risk  (08/18/2023)   Overall Financial Resource Strain (CARDIA)    Difficulty of Paying Living Expenses: Not hard at all  Food Insecurity: No Food Insecurity (08/18/2023)   Hunger Vital Sign    Worried About Running Out of Food in the Last Year: Never true    Ran Out of Food in the Last Year: Never true  Transportation Needs: No Transportation  Needs (08/18/2023)   PRAPARE - Administrator, Civil Service (Medical): No    Lack of Transportation (Non-Medical): No  Physical Activity: Sufficiently Active (08/18/2023)   Exercise Vital Sign    Days of Exercise per Week: 6 days    Minutes of Exercise per Session: 30 min  Stress: No Stress Concern Present (08/18/2023)   [employer] of Occupational Health - Occupational Stress Questionnaire    Feeling of Stress: Not at all  Social Connections: Moderately Isolated (08/18/2023)   Social Connection and Isolation Panel    Frequency of Communication with Friends and Family: More than three times a week    Frequency of Social Gatherings with Friends and Family: Twice a week    Attends Religious Services: Never  Active Member of Clubs or Organizations: No    Attends Engineer, structural: Not on file    Marital Status: Married    Tobacco Counseling Counseling given: Not Answered    Clinical Intake:  Pre-visit preparation completed: Yes  Pain : No/denies pain     Nutritional Risks: None Diabetes: No  Lab Results  Component Value Date   HGBA1C 5.2 03/18/2023     How often do you need to have someone help you when you read instructions, pamphlets, or other written materials from your doctor or pharmacy?: 1 - Never  Interpreter Needed?: No  Information entered by :: NAllen LPN   Activities of Daily Living     08/18/2023   10:26 AM  In your present state of health, do you have any difficulty performing the following activities:  Hearing? 0  Vision? 0  Difficulty concentrating or making decisions? 0  Walking or climbing stairs? 0  Dressing or bathing? 0  Doing errands, shopping? 0  Preparing Food and eating ? N  Using the Toilet? N  In the past six months, have you accidently leaked urine? N  Do you have problems with loss of bowel control? N  Managing your Medications? N  Managing your Finances? N  Housekeeping or managing your  Housekeeping? N    Patient Care Team: Berneta Elsie Sayre, MD as PCP - General (Family Medicine) Pll[facility], [facility]  Matilda Senior, MD as Consulting Physician (Urology) Ishmael Slough, MD as Consulting Physician (Rheumatology)  I have updated your Care Teams any recent Medical Services you may have received from other providers in the past year.     Assessment:   This is a routine wellness examination for Anthony Greene.  Hearing/Vision screen Hearing Screening - Comments:: Denies hearing issues Vision Screening - Comments:: Regular eye exams, [facility]   Goals Addressed             This Visit's Progress    Patient Stated       08/22/2023, wants to lose weight       Depression Screen     08/22/2023    8:14 AM 03/18/2023    9:36 AM 08/20/2022    8:45 AM 11/23/2021   10:47 AM 09/22/2021    3:34 PM 09/04/2021    2:43 PM 08/24/2021    2:19 PM  PHQ 2/9 Scores  PHQ - 2 Score 0 0 0 0 0 0 0  PHQ- 9 Score 0 0 0        Fall Risk     08/18/2023   10:26 AM 03/18/2023    9:36 AM 08/16/2022   10:13 AM 11/23/2021   10:47 AM 09/22/2021    3:34 PM  Fall Risk   Falls in the past year? 0 0 0 0 0  Number falls in past yr: 0 0 0 0 0  Injury with Fall? 0 0 0 0   Risk for fall due to : Medication side effect No Fall Risks Medication side effect    Follow up Falls prevention discussed;Falls evaluation completed Falls evaluation completed Falls prevention discussed;Falls evaluation completed      MEDICARE RISK AT HOME:  Medicare Risk at Home Any stairs in or around the home?: (Patient-Rptd) Yes If so, are there any without handrails?: (Patient-Rptd) No Home free of loose throw rugs in walkways, pet beds, electrical cords, etc?: (Patient-Rptd) No Adequate lighting in your home to reduce risk of falls?: (Patient-Rptd) Yes Life alert?: (Patient-Rptd) No Use of  a cane, walker or w/c?: (Patient-Rptd) No Grab bars in the bathroom?: (Patient-Rptd) No Shower chair or bench in  shower?: (Patient-Rptd) Yes Elevated toilet seat or a handicapped toilet?: (Patient-Rptd) No  TIMED UP AND GO:  Was the test performed?  No  Cognitive Function: 6CIT completed        08/22/2023    8:14 AM 08/20/2022    8:45 AM 08/24/2021    2:20 PM  6CIT Screen  What Year? 0 points 0 points 0 points  What month? 0 points 0 points 0 points  What time? 0 points 0 points 0 points  Count back from 20 0 points 0 points 0 points  Months in reverse 0 points 0 points 0 points  Repeat phrase 2 points 2 points 0 points  Total Score 2 points 2 points 0 points    Immunizations Immunization History  Administered Date(s) Administered   Fluad Quad(high Dose 65+) 09/09/2015, 09/12/2018, 09/29/2021   Influenza Inj Mdck Quad Pf 11/01/2017   Influenza Split 11/05/2014   Influenza,inj,quad, With Preservative 09/09/2015   Influenza-Unspecified 09/18/2016, 11/02/2019   PFIZER(Purple Top)SARS-COV-2 Vaccination 02/24/2019, 03/21/2019, 09/03/2019, 05/08/2020   PNEUMOCOCCAL CONJUGATE-20 03/18/2023   Pneumococcal Polysaccharide-23 11/05/2014   Pneumococcal-Unspecified 02/14/2023   Tdap 02/26/2019   Zoster Recombinant(Shingrix) 11/28/2019    Screening Tests Health Maintenance  Topic Date Due   Hepatitis C Screening  Never done   INFLUENZA VACCINE  08/19/2023   Medicare Annual Wellness (AWV)  08/21/2024   Colonoscopy  08/24/2024   DTaP/Tdap/Td (2 - Td or Tdap) 02/25/2029   Pneumococcal Vaccine: 50+ Years  Completed   Hepatitis B Vaccines  Aged Out   HPV VACCINES  Aged Out   Meningococcal B Vaccine  Aged Out   COVID-19 Vaccine  Discontinued   Zoster Vaccines- Shingrix  Discontinued    Health Maintenance  Health Maintenance Due  Topic Date Due   Hepatitis C Screening  Never done   INFLUENZA VACCINE  08/19/2023   Health Maintenance Items Addressed: Hep C screening due.  Additional Screening:  Vision Screening: Recommended annual ophthalmology exams for early detection of glaucoma and  other disorders of the eye. Would you like a referral to an eye doctor? No    Dental Screening: Recommended annual dental exams for proper oral hygiene  Community Resource Referral / Chronic Care Management: CRR required this visit?  No   CCM required this visit?  No   Plan:    I have personally reviewed and noted the following in the patient's chart:   Medical and social history Use of alcohol, tobacco or illicit drugs  Current medications and supplements including opioid prescriptions. Patient is not currently taking opioid prescriptions. Functional ability and status Nutritional status Physical activity Advanced directives List of other physicians Hospitalizations, surgeries, and ER visits in previous 12 months Vitals Screenings to include cognitive, depression, and falls Referrals and appointments  In addition, I have reviewed and discussed with patient certain preventive protocols, quality metrics, and best practice recommendations. A written personalized care plan for preventive services as well as general preventive health recommendations were provided to patient.   Ardella FORBES Dawn, LPN   01/23/7972   After Visit Summary: (MyChart) Due to this being a telephonic visit, the after visit summary with patients personalized plan was offered to patient via MyChart   Notes: Nothing significant to report at this time.

## 2023-08-22 NOTE — Patient Instructions (Signed)
 Anthony Greene , Thank you for taking time out of your busy schedule to complete your Annual Wellness Visit with me. I enjoyed our conversation and look forward to speaking with you again next year. I, as well as your care team,  appreciate your ongoing commitment to your health goals. Please review the following plan we discussed and let me know if I can assist you in the future. Your Game plan/ To Do List    Referrals: If you haven't heard from the office you've been referred to, please reach out to them at the phone provided.   Follow up Visits: We will see or speak with you next year for your Next Medicare AWV with our clinical staff Have you seen your provider in the last 6 months (3 months if uncontrolled diabetes)? No  Clinician Recommendations:  Aim for 30 minutes of exercise or brisk walking, 6-8 glasses of water, and 5 servings of fruits and vegetables each day.       This is a list of the screenings recommended for you:  Health Maintenance  Topic Date Due   Hepatitis C Screening  Never done   Flu Shot  08/19/2023   Medicare Annual Wellness Visit  08/21/2024   Colon Cancer Screening  08/24/2024   DTaP/Tdap/Td vaccine (2 - Td or Tdap) 02/25/2029   Pneumococcal Vaccine for age over 58  Completed   Hepatitis B Vaccine  Aged Out   HPV Vaccine  Aged Out   Meningitis B Vaccine  Aged Out   COVID-19 Vaccine  Discontinued   Zoster (Shingles) Vaccine  Discontinued    Advanced directives: (Copy Requested) Please bring a copy of your health care power of attorney and living will to the office to be added to your chart at your convenience. You can mail to [facility] [address] [place], [place] [address] or email to [email or web address] Advance Care Planning is important because it:  [x]  Makes sure you receive the medical care that is consistent with your values, goals, and preferences  [x]  It provides guidance to your family and loved ones and reduces their  decisional burden about whether or not they are making the right decisions based on your wishes.  Follow the link provided in your after visit summary or read over the paperwork we have mailed to you to help you started getting your Advance Directives in place. If you need assistance in completing these, please reach out to us  so that we can help you!  See attachments for Preventive Care and Fall Prevention Tips.

## 2023-08-23 DIAGNOSIS — L4050 Arthropathic psoriasis, unspecified: Secondary | ICD-10-CM | POA: Diagnosis not present

## 2023-08-23 DIAGNOSIS — R12 Heartburn: Secondary | ICD-10-CM | POA: Diagnosis not present

## 2023-08-23 DIAGNOSIS — M25552 Pain in left hip: Secondary | ICD-10-CM | POA: Diagnosis not present

## 2023-08-23 DIAGNOSIS — M65332 Trigger finger, left middle finger: Secondary | ICD-10-CM | POA: Diagnosis not present

## 2023-08-23 DIAGNOSIS — N400 Enlarged prostate without lower urinary tract symptoms: Secondary | ICD-10-CM | POA: Diagnosis not present

## 2023-08-31 DIAGNOSIS — L4050 Arthropathic psoriasis, unspecified: Secondary | ICD-10-CM | POA: Diagnosis not present

## 2023-09-14 DIAGNOSIS — M1612 Unilateral primary osteoarthritis, left hip: Secondary | ICD-10-CM | POA: Diagnosis not present

## 2023-09-14 DIAGNOSIS — M25552 Pain in left hip: Secondary | ICD-10-CM | POA: Diagnosis not present

## 2023-09-16 NOTE — Progress Notes (Signed)
 09/20/2023 Anthony Greene [id number] 01-09-1951  Referring provider: Berneta Elsie Sayre,* Primary GI doctor: Dr. San  ASSESSMENT AND PLAN:  GERD x 4 years Symptoms: burning, regurg, nausea Was severe but responsive to omeprazole  40mg , did trial off with rebound No dysphagia, no melena, no weight loss, no AB pain 03/18/2023 CBC without anemia, no leukocytosis, normal liver function On celebrex 200 mg BID for 8 years, ETOH once a week, quit tobacco use 12 years ago Patient with long term COX 2 use, GERD unable to get on PPI, will proceed with EGD to evaluate further, if patient would like to get off PPI, would suggest taper down to 20 mg and/or pepcid , reflux gourmet, and weight loss.  ADDENDUM patient would have to hold celebrex for 7 days and he is uncertain he can accomplish this, contact patient via mychart, he can get alternative pain med from PCP during the 7 days or we will get diatherix H pylori and celiac.  -Lifestyle changes discussed, avoid NSAIDS, ETOH, hand out given to the patient -Schedule EGD at [facility] PENDING patient response to evaluate GERD, esophagitis, hiatal hernia,H pylori. I discussed risks of EGD with patient today, including risk of sedation, bleeding or perforation. Patient provides understanding and gave verbal consent to proceed.  Personal history of TA polyps 08/24/2021 colonoscopy with Dr. San for screening Good bowel prep 6 polyps 2 to 5 mm, 6 mm polyp, small lipoma, diverticulosis.  TA and SS polyps, recall 3 years  Psoriatic arthritis On Simponi  and celebrex, well controlled  Obesity  Body mass index is 33.68 kg/m.  -Patient has been advised to make an attempt to improve diet and exercise patterns to aid in weight loss. -Recommended diet heavy in fruits and veggies and low in animal meats, cheeses, and dairy products, appropriate calorie intake  Patient Care Team: Berneta Elsie Sayre, MD as PCP - General (Family Medicine) Pllc,  [facility]  Matilda Senior, MD as Consulting Physician (Urology) Ishmael Slough, MD as Consulting Physician (Rheumatology)  HISTORY OF PRESENT ILLNESS: 73 y.o. male with a past medical history listed below presents for evaluation of GERD.   Discussed the use of AI scribe software for clinical note transcription with the patient, who gave verbal consent to proceed.  History of Present Illness   Anthony Greene is a 73 year old male who presents with reflux symptoms. He was referred by his new family doctor for evaluation of reflux symptoms and consideration of an upper endoscopy.  He has experienced severe heartburn for the past three to four years, which has been well-controlled with omeprazole . Initially, omeprazole  was prescribed without an upper endoscopy. When he temporarily discontinued omeprazole , his heartburn symptoms returned within a day or two, prompting him to resume the medication. He currently takes 40 mg of omeprazole  once daily. Prior to starting omeprazole , he experienced nausea and a burning sensation, particularly when leaning over, but no abdominal pain.  He has been taking Celebrex twice daily for approximately eight years for psoriatic arthritis and receives Simponi  injections every eight weeks. He denies the use of additional NSAIDs like Aleve or ibuprofen and has never taken prednisone  for his arthritis.  No history of trouble swallowing, dark stools, weight loss, abdominal pain, shortness of breath, or chest pain.  He quit smoking approximately twelve years ago and does not drink alcohol regularly.      He  reports that he has quit smoking. His smoking use included cigars. He has never used smokeless tobacco. He  reports current alcohol use. He reports that he does not use drugs.  RELEVANT GI HISTORY, IMAGING AND LABS: Results   DIAGNOSTIC Colonoscopy: Polyps present (08/2021)      CBC    Component Value Date/Time   WBC 4.1 03/18/2023  0918   RBC 4.75 03/18/2023 0918   HGB 14.9 03/18/2023 0918   HCT 44.4 03/18/2023 0918   PLT 186.0 03/18/2023 0918   MCV 93.5 03/18/2023 0918   MCH 31.6 06/23/2017 1036   MCHC 33.5 03/18/2023 0918   RDW 13.5 03/18/2023 0918   LYMPHSABS 1.7 09/15/2020 0923   MONOABS 0.5 09/15/2020 0923   EOSABS 0.1 09/15/2020 0923   BASOSABS 0.0 09/15/2020 0923   Recent Labs    03/18/23 0918  HGB 14.9    CMP     Component Value Date/Time   NA 139 03/18/2023 0918   NA 140 06/12/2019 0000   K 4.1 03/18/2023 0918   CL 106 03/18/2023 0918   CO2 29 03/18/2023 0918   GLUCOSE 105 (H) 03/18/2023 0918   BUN 16 03/18/2023 0918   BUN 17 06/12/2019 0000   CREATININE 0.91 03/18/2023 0918   CALCIUM 8.8 03/18/2023 0918   PROT 6.3 03/18/2023 0918   PROT 6.6 09/04/2020 0905   ALBUMIN 4.0 03/18/2023 0918   AST 18 03/18/2023 0918   ALT 16 03/18/2023 0918   ALKPHOS 52 03/18/2023 0918   BILITOT 0.7 03/18/2023 0918   GFRNONAA 83 06/12/2019 0000   GFRAA 96 06/12/2019 0000      Latest Ref Rng & Units 03/18/2023    9:18 AM 09/23/2021    7:57 AM 09/04/2020    9:05 AM  Hepatic Function  Total Protein 6.0 - 8.3 g/dL 6.3  6.5  6.6   Albumin 3.5 - 5.2 g/dL 4.0  4.0    AST 0 - 37 U/L 18  17    ALT 0 - 53 U/L 16  17    Alk Phosphatase 39 - 117 U/L 52  42    Total Bilirubin 0.2 - 1.2 mg/dL 0.7  0.5        Current Medications:      Current Outpatient Medications (Analgesics):    celecoxib (CELEBREX) 200 MG capsule, Take 200 mg by mouth at bedtime.    Golimumab  (SIMPONI  ARIA IV), Inject into the vein.   Current Outpatient Medications (Other):    finasteride  (PROSCAR ) 5 MG tablet, Take 1 tablet (5 mg total) by mouth daily.   omeprazole  (PRILOSEC) 40 MG capsule, TAKE 1 CAPSULE DAILY   tamsulosin  (FLOMAX ) 0.4 MG CAPS capsule, TAKE 2 CAPSULES DAILY (NEED APPOINTMENT BEFORE NEXT REFILL)  Medical History:  Past Medical History:  Diagnosis Date   Arthritis    Closed fracture of transverse process of  lumbar vertebra (HCC) 06/06/2017   Hematoma of hip, right, initial encounter 06/23/2017   Injury resulting from fall from height 06/07/2017   Intractable low back pain 06/07/2017   Neuropathy    Osteoarthritis    Pilonidal abscess 06/22/2017   Pilonidal cyst 06/22/2017   Psoriasis    Psoriatic arthritis (HCC)    Sebaceous cyst    Allergies:  Allergies  Allergen Reactions   Dilaudid  [Hydromorphone  Hcl] Nausea And Vomiting   Morphine  And Codeine Nausea And Vomiting   Vancomycin  Other (See Comments)    redman syndrome   Penicillins Rash    Patient has only had rash reaction to PCN in the past.  Patient and wife state he Tolerates keflex and  Ancef  with  no problem.  Has patient had a PCN reaction causing immediate rash, facial/tongue/throat swelling, SOB or lightheadedness with hypotension: Yes- rash only Has patient had a PCN reaction causing severe rash involving mucus membranes or skin necrosis: No Has patient had a PCN reaction that required hospitalization: No Has patient had a PCN reaction occurring within the last 10 years: No     Surgical History:  He  has a past surgical history that includes Joint replacement; Irrigation and debridement buttocks (Right, 06/23/2017); Dressing change under anesthesia (N/A, 06/25/2017); Tonsillectomy; Knee arthroscopy (Right); Carpal tunnel release (Right); Colonoscopy with propofol ; Pilonidal cyst excision (N/A, 01/20/2018); and Laminotomy (2023). Family History:  His family history includes Asthma in his mother; COPD in his mother; Cancer in his brother; Colon cancer in his brother; Heart disease in his father; Stroke in his father.  REVIEW OF SYSTEMS  : All other systems reviewed and negative except where noted in the History of Present Illness.  PHYSICAL EXAM: BP 130/78   Pulse 64   Ht 5' 8 (1.727 m)   Wt 221 lb 8 oz (100.5 kg)   BMI 33.68 kg/m  Physical Exam   GENERAL APPEARANCE: Well nourished, in no apparent distress. HEENT: No cervical  lymphadenopathy, unremarkable thyroid , sclerae anicteric, conjunctiva pink. RESPIRATORY: Respiratory effort normal, breath sounds clear and equal bilaterally without rales, rhonchi, or wheezing. CARDIO: Regular rate and rhythm with no murmurs, rubs, or gallops, peripheral pulses intact. ABDOMEN: Soft, non-distended, active bowel sounds in all four quadrants, no tenderness to palpation, no rebound, no mass appreciated. RECTAL: Declines. MUSCULOSKELETAL: Full range of motion, normal gait, without edema. SKIN: Dry, intact without rashes or lesions. No jaundice. NEURO: Alert, oriented, no focal deficits. PSYCH: Cooperative, normal mood and affect. EXTREMITIES: No edema in extremities.      Alan JONELLE Coombs, PA-C 8:37 AM

## 2023-09-20 ENCOUNTER — Ambulatory Visit: Admitting: Physician Assistant

## 2023-09-20 ENCOUNTER — Encounter: Payer: Self-pay | Admitting: Physician Assistant

## 2023-09-20 VITALS — BP 130/78 | HR 64 | Ht 68.0 in | Wt 221.5 lb

## 2023-09-20 DIAGNOSIS — Z6833 Body mass index (BMI) 33.0-33.9, adult: Secondary | ICD-10-CM

## 2023-09-20 DIAGNOSIS — Z860101 Personal history of adenomatous and serrated colon polyps: Secondary | ICD-10-CM | POA: Diagnosis not present

## 2023-09-20 DIAGNOSIS — K219 Gastro-esophageal reflux disease without esophagitis: Secondary | ICD-10-CM

## 2023-09-20 DIAGNOSIS — E669 Obesity, unspecified: Secondary | ICD-10-CM

## 2023-09-20 DIAGNOSIS — L4050 Arthropathic psoriasis, unspecified: Secondary | ICD-10-CM

## 2023-09-20 NOTE — Patient Instructions (Addendum)
 Please take your proton pump inhibitor medication, omeprazole  40, Please take this medication 30 minutes to 1 hour before meals- this makes it more effective.  Can consider going down to 20 mg prilosec Avoid spicy and acidic foods Avoid fatty foods Limit your intake of coffee, tea, alcohol, and carbonated drinks Work to maintain a healthy weight Keep the head of the bed elevated at least 3 inches with blocks or a wedge pillow if you are having any nighttime symptoms Stay upright for 2 hours after eating Avoid meals and snacks three to four hours before bedtime  You have been scheduled for an endoscopy. Please follow written instructions given to you at your visit today.  If you use inhalers (even only as needed), please bring them with you on the day of your procedure.  If you take any of the following medications, they will need to be adjusted prior to your procedure:   DO NOT TAKE 7 DAYS PRIOR TO TEST- Trulicity (dulaglutide) Ozempic, Wegovy (semaglutide) Mounjaro (tirzepatide) Bydureon Bcise (exanatide extended release)  DO NOT TAKE 1 DAY PRIOR TO YOUR TEST Rybelsus (semaglutide) Adlyxin (lixisenatide) Victoza (liraglutide) Byetta (exanatide) ___________________________________________________________________________   Thank you for trusting me with your gastrointestinal care!   Alan Coombs, PA-C    _______________________________________________________  If your blood pressure at your visit was 140/90 or greater, please contact your primary care physician to follow up on this.  _______________________________________________________  If you are age 107 or older, your body mass index should be between 23-30. Your Body mass index is 33.68 kg/m. If this is out of the aforementioned range listed, please consider follow up with your Primary Care Provider.  If you are age 23 or younger, your body mass index should be between 19-25. Your Body mass index is 33.68 kg/m. If  this is out of the aformentioned range listed, please consider follow up with your Primary Care Provider.   ________________________________________________________  The [facility] providers would like to encourage you to use MYCHART to communicate with providers for non-urgent requests or questions.  Due to long hold times on the telephone, sending your provider a message by [facility] may be a faster and more efficient way to get a response.  Please allow 48 business hours for a response.  Please remember that this is for non-urgent requests.  _______________________________________________________  [facility] is using a team-based approach to care.  Your team is made up of your doctor and two to three APPS. Our APPS (Nurse Practitioners and Physician Assistants) work with your physician to ensure care continuity for you. They are fully qualified to address your health concerns and develop a treatment plan. They communicate directly with your gastroenterologist to care for you. Seeing the Advanced Practice Practitioners on your physician's team can help you by facilitating care more promptly, often allowing for earlier appointments, access to diagnostic testing, procedures, and other specialty referrals.

## 2023-09-20 NOTE — Telephone Encounter (Signed)
 Thanks.  He is scheduled already in October.

## 2023-09-29 NOTE — Progress Notes (Signed)
 Agree with the assessment and plan as outlined by Quentin Mulling, PA-C. ? ?Keron Neenan, DO, FACG ? ?

## 2023-10-05 DIAGNOSIS — M1612 Unilateral primary osteoarthritis, left hip: Secondary | ICD-10-CM | POA: Diagnosis not present

## 2023-10-20 ENCOUNTER — Other Ambulatory Visit: Payer: Self-pay | Admitting: Medical Genetics

## 2023-10-24 DIAGNOSIS — M1612 Unilateral primary osteoarthritis, left hip: Secondary | ICD-10-CM | POA: Diagnosis not present

## 2023-10-25 DIAGNOSIS — Z23 Encounter for immunization: Secondary | ICD-10-CM | POA: Diagnosis not present

## 2023-10-26 DIAGNOSIS — L4050 Arthropathic psoriasis, unspecified: Secondary | ICD-10-CM | POA: Diagnosis not present

## 2023-10-27 ENCOUNTER — Encounter: Payer: Self-pay | Admitting: Gastroenterology

## 2023-10-27 ENCOUNTER — Ambulatory Visit: Admitting: Gastroenterology

## 2023-10-27 VITALS — BP 121/70 | HR 57 | Temp 97.5°F | Resp 12 | Ht 68.0 in | Wt 221.0 lb

## 2023-10-27 DIAGNOSIS — K3189 Other diseases of stomach and duodenum: Secondary | ICD-10-CM | POA: Diagnosis not present

## 2023-10-27 DIAGNOSIS — K2950 Unspecified chronic gastritis without bleeding: Secondary | ICD-10-CM | POA: Diagnosis not present

## 2023-10-27 DIAGNOSIS — K219 Gastro-esophageal reflux disease without esophagitis: Secondary | ICD-10-CM | POA: Diagnosis not present

## 2023-10-27 DIAGNOSIS — Z79899 Other long term (current) drug therapy: Secondary | ICD-10-CM | POA: Diagnosis not present

## 2023-10-27 DIAGNOSIS — D132 Benign neoplasm of duodenum: Secondary | ICD-10-CM | POA: Diagnosis not present

## 2023-10-27 DIAGNOSIS — D175 Benign lipomatous neoplasm of intra-abdominal organs: Secondary | ICD-10-CM

## 2023-10-27 DIAGNOSIS — Z13810 Encounter for screening for upper gastrointestinal disorder: Secondary | ICD-10-CM | POA: Diagnosis not present

## 2023-10-27 DIAGNOSIS — K317 Polyp of stomach and duodenum: Secondary | ICD-10-CM | POA: Diagnosis not present

## 2023-10-27 DIAGNOSIS — L409 Psoriasis, unspecified: Secondary | ICD-10-CM | POA: Diagnosis not present

## 2023-10-27 DIAGNOSIS — K2970 Gastritis, unspecified, without bleeding: Secondary | ICD-10-CM

## 2023-10-27 DIAGNOSIS — G4730 Sleep apnea, unspecified: Secondary | ICD-10-CM | POA: Diagnosis not present

## 2023-10-27 DIAGNOSIS — K319 Disease of stomach and duodenum, unspecified: Secondary | ICD-10-CM | POA: Diagnosis not present

## 2023-10-27 DIAGNOSIS — L4050 Arthropathic psoriasis, unspecified: Secondary | ICD-10-CM | POA: Diagnosis not present

## 2023-10-27 DIAGNOSIS — Z6831 Body mass index (BMI) 31.0-31.9, adult: Secondary | ICD-10-CM | POA: Diagnosis not present

## 2023-10-27 DIAGNOSIS — E669 Obesity, unspecified: Secondary | ICD-10-CM | POA: Diagnosis not present

## 2023-10-27 DIAGNOSIS — M1991 Primary osteoarthritis, unspecified site: Secondary | ICD-10-CM | POA: Diagnosis not present

## 2023-10-27 MED ORDER — SODIUM CHLORIDE 0.9 % IV SOLN: 500.0000 mL | Freq: Once | INTRAVENOUS | Status: DC

## 2023-10-27 NOTE — Progress Notes (Signed)
1312 Robinul 0.1 mg IV given due large amount of secretions upon assessment.  MD made aware, vss

## 2023-10-27 NOTE — Progress Notes (Signed)
 Report given to PACU, vss

## 2023-10-27 NOTE — Progress Notes (Signed)
 GASTROENTEROLOGY PROCEDURE H&P NOTE   Primary Care Physician: Rolinda Millman, MD    Reason for Procedure:  GERD, regurgitation, nausea  Plan:    EGD  Patient is appropriate for endoscopic procedure(s) in the ambulatory ([facility]) setting.  The nature of the procedure, as well as the risks, benefits, and alternatives were carefully and thoroughly reviewed with the patient. Ample time for discussion and questions allowed. The patient understood, was satisfied, and agreed to proceed.     HPI: Anthony Greene is a 73 y.o. male who presents for EGD for evaluation of GERD, heartburn, nausea.  Reflux symptoms responsive to omeprazole  40 mg daily.  Does take Celebrex twice daily for the last 8+ years for psoriatic arthritis along with Simponi  injections.  No additional NSAIDs.  Otherwise no significant changes in clinical history since last OV on 09/20/2023.  Past Medical History:  Diagnosis Date   Arthritis    Closed fracture of transverse process of lumbar vertebra (HCC) 06/06/2017   Hematoma of hip, right, initial encounter 06/23/2017   Injury resulting from fall from height 06/07/2017   Intractable low back pain 06/07/2017   Neuropathy    Osteoarthritis    Pilonidal abscess 06/22/2017   Pilonidal cyst 06/22/2017   Psoriasis    Psoriatic arthritis (HCC)    Sebaceous cyst    Sleep apnea     Past Surgical History:  Procedure Laterality Date   CARPAL TUNNEL RELEASE Right    COLONOSCOPY WITH PROPOFOL      DRESSING CHANGE UNDER ANESTHESIA N/A 06/25/2017   Procedure: DRESSING CHANGE UNDER ANESTHESIA;  Surgeon: Wonda Charlie BRAVO, MD;  Location: [facility] ORS;  Service: General;  Laterality: N/A;   IRRIGATION AND DEBRIDEMENT BUTTOCKS Right 06/23/2017   Procedure: IRRIGATION AND DEBRIDEMENT BUTTOCKS;  Surgeon: Nicholaus Selinda Birmingham, MD;  Location: [facility] ORS;  Service: General;  Laterality: Right;   JOINT REPLACEMENT     BL knee   KNEE ARTHROSCOPY Right    LAMINOTOMY  2023   PILONIDAL CYST  EXCISION N/A 01/20/2018   Procedure: EXCISION OF INCREASINGLY SYMPTOMATIC PILONIDAL CYST AND RIGHT OF UPPER BACK SEBACEOUS CYST;  Surgeon: Nicholaus Selinda Birmingham, MD;  Location: [facility] ORS;  Service: General;  Laterality: N/A;   TONSILLECTOMY      Prior to Admission medications   Medication Sig Start Date End Date Taking? Authorizing Provider  finasteride  (PROSCAR ) 5 MG tablet Take 1 tablet (5 mg total) by mouth daily. 04/25/23  Yes Matilda Senior, MD  Golimumab  (SIMPONI  ARIA IV) Inject into the vein.   Yes [provider]  omeprazole  (PRILOSEC) 40 MG capsule TAKE 1 CAPSULE DAILY 12/27/22  Yes Berneta Elsie Sayre, MD  tamsulosin  (FLOMAX ) 0.4 MG CAPS capsule TAKE 2 CAPSULES DAILY (NEED APPOINTMENT BEFORE NEXT REFILL) 06/27/23  Yes Berneta Elsie Sayre, MD  celecoxib (CELEBREX) 200 MG capsule Take 200 mg by mouth at bedtime.     [provider]    Current Outpatient Medications  Medication Sig Dispense Refill   finasteride  (PROSCAR ) 5 MG tablet Take 1 tablet (5 mg total) by mouth daily. 90 tablet 3   Golimumab  (SIMPONI  ARIA IV) Inject into the vein.     omeprazole  (PRILOSEC) 40 MG capsule TAKE 1 CAPSULE DAILY 90 capsule 3   tamsulosin  (FLOMAX ) 0.4 MG CAPS capsule TAKE 2 CAPSULES DAILY (NEED APPOINTMENT BEFORE NEXT REFILL) 180 capsule 3   celecoxib (CELEBREX) 200 MG capsule Take 200 mg by mouth at bedtime.      Current Facility-Administered Medications  Medication Dose Route Frequency  Provider Last Rate Last Admin   0.9 %  sodium chloride  infusion  500 mL Intravenous Once Caliah Kopke V, DO        Allergies as of 10/27/2023 - Review Complete 10/27/2023  Allergen Reaction Noted   Vancomycin  Other (See Comments) 06/19/2017   Dilaudid  [hydromorphone  hcl] Nausea And Vomiting 01/09/2018   Morphine  and codeine Nausea And Vomiting 01/09/2018   Penicillins Rash 06/19/2017    Family History  Problem Relation Age of Onset   Asthma Mother    COPD Mother    Stroke Father     Heart disease Father    Colon cancer Brother    Cancer Brother    Colon polyps Neg Hx    Esophageal cancer Neg Hx    Stomach cancer Neg Hx    Rectal cancer Neg Hx     Social History   Socioeconomic History   Marital status: Married    Spouse name: Not on file   Number of children: Not on file   Years of education: Not on file   Highest education level: Bachelor's degree (e.g., BA, AB, BS)  Occupational History   Not on file  Tobacco Use   Smoking status: Former    Types: Cigars   Smokeless tobacco: Never  Vaping Use   Vaping status: Never Used  Substance and Sexual Activity   Alcohol use: Yes    Comment: 1-3 alcoholic drinks daily   Drug use: Never   Sexual activity: Yes  Other Topics Concern   Not on file  Social History Narrative   Not on file   Social Drivers of Health   Financial Resource Strain: Low Risk  (08/18/2023)   Overall Financial Resource Strain (CARDIA)    Difficulty of Paying Living Expenses: Not hard at all  Food Insecurity: No Food Insecurity (08/18/2023)   Hunger Vital Sign    Worried About Running Out of Food in the Last Year: Never true    Ran Out of Food in the Last Year: Never true  Transportation Needs: No Transportation Needs (08/18/2023)   PRAPARE - Administrator, Civil Service (Medical): No    Lack of Transportation (Non-Medical): No  Physical Activity: Sufficiently Active (08/18/2023)   Exercise Vital Sign    Days of Exercise per Week: 6 days    Minutes of Exercise per Session: 30 min  Stress: No Stress Concern Present (08/18/2023)   [employer] of Occupational Health - Occupational Stress Questionnaire    Feeling of Stress: Not at all  Social Connections: Moderately Isolated (08/18/2023)   Social Connection and Isolation Panel    Frequency of Communication with Friends and Family: More than three times a week    Frequency of Social Gatherings with Friends and Family: Twice a week    Attends Religious Services: Never     Database administrator or Organizations: No    Attends Engineer, structural: Not on file    Marital Status: Married  Catering manager Violence: Not At Risk (08/22/2023)   Humiliation, Afraid, Rape, and Kick questionnaire    Fear of Current or Ex-Partner: No    Emotionally Abused: No    Physically Abused: No    Sexually Abused: No    Physical Exam: Vital signs in last 24 hours: @BP  (!) 181/103   Pulse (!) 56   Temp (!) 97.5 F (36.4 C)   Resp 18   Ht 5' 8 (1.727 m)   Wt 221 lb (100.2 kg)  SpO2 98%   BMI 33.60 kg/m  GEN: NAD EYE: Sclerae anicteric ENT: MMM CV: Non-tachycardic Pulm: CTA b/l GI: Soft, NT/ND NEURO:  Alert & Oriented x 3   Sandor Flatter, DO [facility]   10/27/2023 1:14 PM

## 2023-10-27 NOTE — Progress Notes (Signed)
 Called to room to assist during endoscopic procedure.  Patient ID and intended procedure confirmed with present staff. Received instructions for my participation in the procedure from the performing physician.

## 2023-10-27 NOTE — Patient Instructions (Addendum)
 Resume previous medications.  Continue present diet.  Await pathology results.   YOU HAD AN ENDOSCOPIC PROCEDURE TODAY AT THE [facility]:   Refer to the procedure report that was given to you for any specific questions about what was found during the examination.  If the procedure report does not answer your questions, please call your gastroenterologist to clarify.  If you requested that your care partner not be given the details of your procedure findings, then the procedure report has been included in a sealed envelope for you to review at your convenience later.  YOU SHOULD EXPECT: Some feelings of bloating in the abdomen. Passage of more gas than usual.  Walking can help get rid of the air that was put into your GI tract during the procedure and reduce the bloating. If you had a lower endoscopy (such as a colonoscopy or flexible sigmoidoscopy) you may notice spotting of blood in your stool or on the toilet paper. If you underwent a bowel prep for your procedure, you may not have a normal bowel movement for a few days.  Please Note:  You might notice some irritation and congestion in your nose or some drainage.  This is from the oxygen used during your procedure.  There is no need for concern and it should clear up in a day or so.  SYMPTOMS TO REPORT IMMEDIATELY:  Following upper endoscopy (EGD)  Vomiting of blood or coffee ground material  New chest pain or pain under the shoulder blades  Painful or persistently difficult swallowing  New shortness of breath  Fever of 100F or higher  Black, tarry-looking stools  For urgent or emergent issues, a gastroenterologist can be reached at any hour by calling [phone]. Do not use MyChart messaging for urgent concerns.    DIET:  We do recommend a small meal at first, but then you may proceed to your regular diet.  Drink plenty of fluids but you should avoid alcoholic beverages for 24 hours.  ACTIVITY:  You should plan to take  it easy for the rest of today and you should NOT DRIVE or use heavy machinery until tomorrow (because of the sedation medicines used during the test).    FOLLOW UP: Our staff will call the number listed on your records the next business day following your procedure.  We will call around 7:15- 8:00 am to check on you and address any questions or concerns that you may have regarding the information given to you following your procedure. If we do not reach you, we will leave a message.     If any biopsies were taken you will be contacted by phone or by letter within the next 1-3 weeks.  Please call us  at [phone] if you have not heard about the biopsies in 3 weeks.    SIGNATURES/CONFIDENTIALITY: You and/or your care partner have signed paperwork which will be entered into your electronic medical record.  These signatures attest to the fact that that the information above on your After Visit Summary has been reviewed and is understood.  Full responsibility of the confidentiality of this discharge information lies with you and/or your care-partner.

## 2023-10-27 NOTE — Op Note (Signed)
  [facility] Patient Name: Anthony Greene Procedure Date: 10/27/2023 12:28 PM MRN: [id number] Endoscopist: Sandor Flatter , MD, [id number] Age: 73 Referring MD:  Date of Birth: 08/02/1950 Gender: Male Account #: [id number] Procedure:                Upper GI endoscopy Indications:              Heartburn, Suspected esophageal reflux, Screening                            for Barrett's esophagus                           No previous EGD Medicines:                Monitored Anesthesia Care Procedure:                Pre-Anesthesia Assessment:                           - Prior to the procedure, a History and Physical                            was performed, and patient medications and                            allergies were reviewed. The patient's tolerance of                            previous anesthesia was also reviewed. The risks                            and benefits of the procedure and the sedation                            options and risks were discussed with the patient.                            All questions were answered, and informed consent                            was obtained. Prior Anticoagulants: The patient has                            taken no anticoagulant or antiplatelet agents. ASA                            Grade Assessment: II - A patient with mild systemic                            disease. After reviewing the risks and benefits,                            the patient was deemed in satisfactory condition to  undergo the procedure.                           After obtaining informed consent, the endoscope was                            passed under direct vision. Throughout the                            procedure, the patient's blood pressure, pulse, and                            oxygen saturations were monitored continuously. The                            Olympus scope [id number] was introduced through the                             mouth, and advanced to the third part of duodenum.                            The upper GI endoscopy was accomplished without                            difficulty. The patient tolerated the procedure                            well. Scope In: Scope Out: Findings:                 The examined esophagus was normal.                           The Z-line was regular and was found 41 cm from the                            incisors.                           Scattered mild inflammation characterized by                            erythema was found in the gastric body and in the                            gastric antrum. Biopsies were taken with a cold                            forceps for Helicobacter pylori testing. Estimated                            blood loss was minimal.                           A few small sessile polyps with no bleeding were  found in the gastric fundus and in the gastric                            body. Several of these polyps were removed with a                            cold biopsy forceps. Resection and retrieval were                            complete. Estimated blood loss was minimal.                           Patchy mucosal variance characterized by                            granularity and white patches was found in the                            second portion of the duodenum and in the third                            portion of the duodenum. Biopsies were taken with a                            cold forceps for histology. Estimated blood loss                            was minimal.                           There was a medium-sized lipoma in the second                            portion of the duodenum. Biopsies were taken with a                            cold forceps for histology. Estimated blood loss                            was minimal. Complications:            No immediate complications. Estimated Blood  Loss:     Estimated blood loss was minimal. Impression:               - Normal esophagus.                           - Z-line regular, 41 cm from the incisors.                           - Mild, non-ulcer gastritis. Biopsied.                           - A few gastric polyps. Resected and retrieved.                           -  Mucosal variant in the duodenum. Biopsied.                           - Duodenal lipoma. Biopsied. Recommendation:           - Patient has a contact number available for                            emergencies. The signs and symptoms of potential                            delayed complications were discussed with the                            patient. Return to normal activities tomorrow.                            Written discharge instructions were provided to the                            patient.                           - Resume previous diet.                           - Continue present medications.                           - Await pathology results. Sandor Flatter, MD 10/27/2023 1:42:19 PM

## 2023-10-28 ENCOUNTER — Telehealth: Payer: Self-pay

## 2023-10-28 NOTE — Telephone Encounter (Signed)
  Follow up Call-     10/27/2023   12:52 PM 08/24/2021    7:21 AM  Call back number  Post procedure Call Back phone  # [phone]  Permission to leave phone message Yes Yes     Patient questions:  Do you have a fever, pain , or abdominal swelling? No. Pain Score  0 *  Have you tolerated food without any problems? Yes.    Have you been able to return to your normal activities? Yes.    Do you have any questions about your discharge instructions: Diet   No. Medications  No. Follow up visit  No.  Do you have questions or concerns about your Care? No.  Actions: * If pain score is 4 or above: No action needed, pain <4.

## 2023-11-01 LAB — SURGICAL PATHOLOGY

## 2023-11-09 ENCOUNTER — Ambulatory Visit: Payer: Self-pay | Admitting: Gastroenterology

## 2023-11-09 DIAGNOSIS — K6389 Other specified diseases of intestine: Secondary | ICD-10-CM

## 2023-11-23 ENCOUNTER — Other Ambulatory Visit: Payer: Self-pay

## 2023-11-23 ENCOUNTER — Other Ambulatory Visit

## 2023-11-23 DIAGNOSIS — E669 Obesity, unspecified: Secondary | ICD-10-CM | POA: Diagnosis not present

## 2023-11-23 DIAGNOSIS — Z006 Encounter for examination for normal comparison and control in clinical research program: Secondary | ICD-10-CM

## 2023-11-23 DIAGNOSIS — K6389 Other specified diseases of intestine: Secondary | ICD-10-CM

## 2023-12-06 LAB — GENECONNECT MOLECULAR SCREEN: Genetic Analysis Overall Interpretation: NEGATIVE

## 2023-12-21 DIAGNOSIS — Z79899 Other long term (current) drug therapy: Secondary | ICD-10-CM | POA: Diagnosis not present

## 2023-12-21 DIAGNOSIS — R5383 Other fatigue: Secondary | ICD-10-CM | POA: Diagnosis not present

## 2023-12-21 DIAGNOSIS — Z111 Encounter for screening for respiratory tuberculosis: Secondary | ICD-10-CM | POA: Diagnosis not present

## 2023-12-21 DIAGNOSIS — L4050 Arthropathic psoriasis, unspecified: Secondary | ICD-10-CM | POA: Diagnosis not present

## 2023-12-23 ENCOUNTER — Other Ambulatory Visit: Payer: Self-pay | Admitting: Family Medicine

## 2023-12-23 DIAGNOSIS — K219 Gastro-esophageal reflux disease without esophagitis: Secondary | ICD-10-CM

## 2024-01-09 ENCOUNTER — Telehealth: Payer: Self-pay

## 2024-01-09 ENCOUNTER — Other Ambulatory Visit: Payer: Self-pay

## 2024-01-09 ENCOUNTER — Encounter: Payer: Self-pay | Admitting: Gastroenterology

## 2024-01-09 ENCOUNTER — Encounter (HOSPITAL_COMMUNITY): Payer: Self-pay | Admitting: Gastroenterology

## 2024-01-09 NOTE — Telephone Encounter (Signed)
 Procedure:EGD Procedure date: 12/30/258 Procedure location: [facility] Arrival Time: 9:02 Spoke with the patient Y/N: Y Any prep concerns? N Has the patient obtained the prep from the pharmacy ? N Do you have a care partner and transportation: Y Any additional concerns? N

## 2024-01-16 NOTE — Anesthesia Preprocedure Evaluation (Signed)
"                                    Anesthesia Evaluation  Patient identified by MRN, date of birth, ID band Patient awake    Reviewed: Allergy & Precautions, Patient's Chart, lab work & pertinent test results  History of Anesthesia Complications Negative for: history of anesthetic complications  Airway Mallampati: II  TM Distance: >3 FB Neck ROM: Full    Dental no notable dental hx. (+) Teeth Intact, Dental Advisory Given   Pulmonary sleep apnea , former smoker   Pulmonary exam normal breath sounds clear to auscultation       Cardiovascular (-) hypertension(-) angina (-) Past MI Normal cardiovascular exam Rhythm:Regular Rate:Normal     Neuro/Psych    GI/Hepatic ,GERD  Medicated and Controlled,,  Endo/Other    Renal/GU      Musculoskeletal   Abdominal   Peds  Hematology   Anesthesia Other Findings All: Vancomycin   Reproductive/Obstetrics                              Anesthesia Physical Anesthesia Plan  ASA: 3  Anesthesia Plan: MAC   Post-op Pain Management: Minimal or no pain anticipated   Induction:   PONV Risk Score and Plan: Treatment may vary due to age or medical condition and Propofol  infusion  Airway Management Planned: Natural Airway and Nasal Cannula  Additional Equipment: None  Intra-op Plan:   Post-operative Plan:   Informed Consent: I have reviewed the patients History and Physical, chart, labs and discussed the procedure including the risks, benefits and alternatives for the proposed anesthesia with the patient or authorized representative who has indicated his/her understanding and acceptance.     Dental advisory given  Plan Discussed with:   Anesthesia Plan Comments: (Small bowell adenoma for EGD)         Anesthesia Quick Evaluation  "

## 2024-01-17 ENCOUNTER — Encounter (HOSPITAL_COMMUNITY)
Admission: RE | Disposition: A | Discharge status: Home / Self Care | Payer: Self-pay | Source: Home / Self Care | Attending: Gastroenterology

## 2024-01-17 ENCOUNTER — Ambulatory Visit (HOSPITAL_COMMUNITY): Payer: Self-pay | Admitting: Anesthesiology

## 2024-01-17 ENCOUNTER — Other Ambulatory Visit: Payer: Self-pay

## 2024-01-17 ENCOUNTER — Encounter (HOSPITAL_COMMUNITY): Payer: Self-pay | Admitting: Gastroenterology

## 2024-01-17 ENCOUNTER — Ambulatory Visit (HOSPITAL_COMMUNITY)
Admission: RE | Admit: 2024-01-17 | Discharge: 2024-01-17 | Disposition: A | Discharge status: Home / Self Care | Attending: Gastroenterology | Admitting: Gastroenterology

## 2024-01-17 DIAGNOSIS — D132 Benign neoplasm of duodenum: Secondary | ICD-10-CM | POA: Diagnosis not present

## 2024-01-17 DIAGNOSIS — L4050 Arthropathic psoriasis, unspecified: Secondary | ICD-10-CM | POA: Insufficient documentation

## 2024-01-17 DIAGNOSIS — G4733 Obstructive sleep apnea (adult) (pediatric): Secondary | ICD-10-CM

## 2024-01-17 DIAGNOSIS — K317 Polyp of stomach and duodenum: Secondary | ICD-10-CM

## 2024-01-17 DIAGNOSIS — Z791 Long term (current) use of non-steroidal anti-inflammatories (NSAID): Secondary | ICD-10-CM | POA: Insufficient documentation

## 2024-01-17 DIAGNOSIS — K219 Gastro-esophageal reflux disease without esophagitis: Secondary | ICD-10-CM | POA: Insufficient documentation

## 2024-01-17 DIAGNOSIS — G4730 Sleep apnea, unspecified: Secondary | ICD-10-CM | POA: Insufficient documentation

## 2024-01-17 DIAGNOSIS — D131 Benign neoplasm of stomach: Secondary | ICD-10-CM

## 2024-01-17 DIAGNOSIS — D175 Benign lipomatous neoplasm of intra-abdominal organs: Secondary | ICD-10-CM | POA: Diagnosis not present

## 2024-01-17 DIAGNOSIS — Z87891 Personal history of nicotine dependence: Secondary | ICD-10-CM | POA: Diagnosis not present

## 2024-01-17 DIAGNOSIS — K6389 Other specified diseases of intestine: Secondary | ICD-10-CM

## 2024-01-17 DIAGNOSIS — Z79899 Other long term (current) drug therapy: Secondary | ICD-10-CM | POA: Insufficient documentation

## 2024-01-17 HISTORY — DX: Gastro-esophageal reflux disease without esophagitis: K21.9

## 2024-01-17 HISTORY — PX: POLYPECTOMY: SHX149

## 2024-01-17 HISTORY — PX: ESOPHAGOGASTRODUODENOSCOPY: SHX5428

## 2024-01-17 SURGERY — EGD (ESOPHAGOGASTRODUODENOSCOPY)
Anesthesia: Monitor Anesthesia Care

## 2024-01-17 MED ORDER — PROPOFOL 500 MG/50ML IV EMUL
INTRAVENOUS | Status: DC | PRN
  Administered 2024-01-17: 800 mg via INTRAVENOUS

## 2024-01-17 MED ORDER — LIDOCAINE HCL (CARDIAC) PF 100 MG/5ML IV SOSY
PREFILLED_SYRINGE | INTRAVENOUS | Status: DC | PRN
  Administered 2024-01-17: 50 mg via INTRAVENOUS

## 2024-01-17 MED ORDER — SODIUM CHLORIDE 0.9 % IV SOLN: INTRAVENOUS | Status: DC

## 2024-01-17 MED ORDER — PROPOFOL 1000 MG/100ML IV EMUL
INTRAVENOUS | Status: AC
  Filled 2024-01-17: qty 100

## 2024-01-17 MED ORDER — ONDANSETRON HCL 4 MG/2ML IJ SOLN
INTRAMUSCULAR | Status: DC | PRN
  Administered 2024-01-17: 4 mg via INTRAVENOUS

## 2024-01-17 NOTE — Discharge Instructions (Signed)

## 2024-01-17 NOTE — Transfer of Care (Signed)
 Immediate Anesthesia Transfer of Care Note  Patient: Anthony Greene  Procedure(s) Performed: EGD (ESOPHAGOGASTRODUODENOSCOPY) POLYPECTOMY, INTESTINE  Patient Location: PACU  Anesthesia Type:MAC  Level of Consciousness: awake and alert   Airway & Oxygen Therapy: Patient Spontanous Breathing and Patient connected to face mask oxygen  Post-op Assessment: Report given to RN and Post -op Vital signs reviewed and stable  Post vital signs: Reviewed and stable  Last Vitals:  Vitals Value Taken Time  BP 109/59 01/17/24 09:41  Temp 97.2   Pulse 57 01/17/24 09:42  Resp 14 01/17/24 09:42  SpO2 98 % 01/17/24 09:42  Vitals shown include unfiled device data.  Last Pain:  Vitals:   01/17/24 0825  TempSrc: Temporal  PainSc: 0-No pain         Complications: No notable events documented.

## 2024-01-17 NOTE — Op Note (Signed)
 [facility] Patient Name: Anthony Greene Procedure Date: 01/17/2024 MRN: [id number] Attending MD: Sandor Flatter , MD, [id number] Date of Birth: 1950-12-03 CSN: [id number] Age: 73 Admit Type: Outpatient Procedure:                Upper GI endoscopy Indications:              Follow-up of polyps in the duodenum                           EGD on 10/27/2023 for evaluation of reflux and                            Barrett's Esophagus screening which was notable for                            multiple small benign fundic gland polyps, benign                            non-H. pylori gastritis, duodenal lipoma, and areas                            of mucosal variance with granularity and white                            patches in the 2nd and 3rd portion of the duodenum                            with biopsies showing adenomatous tissue. He                            presents today for repeat upper endoscopy with                            endoscopic mucosal resection/polypectomy. Providers:                Sandor Flatter, MD, Jacquelyn Jaci Pierce, RN,                            Corene Southgate, Technician Referring MD:              Medicines:                Monitored Anesthesia Care Complications:            No immediate complications. Estimated Blood Loss:     Estimated blood loss was minimal. Procedure:                Pre-Anesthesia Assessment:                           - Prior to the procedure, a History and Physical                            was performed, and patient medications and  allergies were reviewed. The patient's tolerance of                            previous anesthesia was also reviewed. The risks                            and benefits of the procedure and the sedation                            options and risks were discussed with the patient.                            All questions were answered, and informed consent                             was obtained. Prior Anticoagulants: The patient has                            taken no anticoagulant or antiplatelet agents. ASA                            Grade Assessment: III - A patient with severe                            systemic disease. After reviewing the risks and                            benefits, the patient was deemed in satisfactory                            condition to undergo the procedure.                           After obtaining informed consent, the endoscope was                            passed under direct vision. Throughout the                            procedure, the patient's blood pressure, pulse, and                            oxygen saturations were monitored continuously. The                            [id number]-H190 ([id number]) Olympus endoscope was introduced                            through the mouth, and advanced to the fourth part                            of duodenum. The upper GI endoscopy was  accomplished without difficulty. The patient                            tolerated the procedure well. Scope In: Scope Out: Findings:      The examined esophagus was normal.      A few small sessile polyps with no bleeding were found in the gastric       fundus and in the gastric body. These were previously biopsied and       confirmed to be benign fundic gland polyps and therefore not biopsied       again today. The mucosa throughout the remainder of the stomach was       otherwise normal-appearing.      Two 4 to 6 mm sessile polyps with no bleeding were found in the second       portion of the duodenum and in the third portion of the duodenum. These       polyps were removed with a combination of cold snare along with cold       forceps around the edges via avulsion technique. Resection and retrieval       were complete. Estimated blood loss was minimal.      There was a medium-sized lipoma in the second portion of the  duodenum. Impression:               - Normal esophagus.                           - A few gastric polyps.                           - Two duodenal polyps. Resected and retrieved.                           - Duodenal lipoma. Moderate Sedation:      Not Applicable - Patient had care per Anesthesia. Recommendation:           - Patient has a contact number available for                            emergencies. The signs and symptoms of potential                            delayed complications were discussed with the                            patient. Return to normal activities tomorrow.                            Written discharge instructions were provided to the                            patient.                           - Resume previous diet.                           - Continue present medications.                           -  Await pathology results.                           - Repeat upper endoscopy for surveillance based on                            pathology results.                           - Return to GI clinic PRN. Procedure Code(s):        --- Professional ---                           [phone], Esophagogastroduodenoscopy, flexible,                            transoral; with removal of tumor(s), polyp(s), or                            other lesion(s) by snare technique Diagnosis Code(s):        --- Professional ---                           K31.7, Polyp of stomach and duodenum                           D17.5, Benign lipomatous neoplasm of                            intra-abdominal organs CPT copyright 2022 American Medical Association. All rights reserved. The codes documented in this report are preliminary and upon coder review may  be revised to meet current compliance requirements. Sandor Flatter, MD 01/17/2024 9:49:59 AM Number of Addenda: 0

## 2024-01-17 NOTE — Anesthesia Postprocedure Evaluation (Signed)
"   Anesthesia Post Note  Patient: Anthony Greene  Procedure(s) Performed: EGD (ESOPHAGOGASTRODUODENOSCOPY) POLYPECTOMY, INTESTINE     Patient location during evaluation: Endoscopy Anesthesia Type: MAC Level of consciousness: awake and alert Pain management: pain level controlled Vital Signs Assessment: post-procedure vital signs reviewed and stable Respiratory status: spontaneous breathing, nonlabored ventilation, respiratory function stable and patient connected to nasal cannula oxygen Cardiovascular status: stable and blood pressure returned to baseline Postop Assessment: no apparent nausea or vomiting Anesthetic complications: no   No notable events documented.  Last Vitals:  Vitals:   01/17/24 0950 01/17/24 1000  BP: 117/63 119/78  Pulse: (!) 58 60  Resp: 13 13  Temp:    SpO2: 98% 95%    Last Pain:  Vitals:   01/17/24 1000  TempSrc:   PainSc: 0-No pain                 Garnette LABOR Anthony Greene      "

## 2024-01-17 NOTE — H&P (Signed)
 "    GASTROENTEROLOGY PROCEDURE H&P NOTE   Primary Care Physician: Rolinda Millman, MD    Reason for Procedure:  Duodenal adenoma resection  Plan:    EGD with endoscopic mucosal resection   Patient is appropriate for endoscopic procedure(s) at [facility].  The nature of the procedure, as well as the risks, benefits, and alternatives were carefully and thoroughly reviewed with the patient. Ample time for discussion and questions allowed. The patient understood, was satisfied, and agreed to proceed. I personally addressed all patient questions and concerns.     HPI: Anthony Greene is a 73 y.o. male who presents for EGD for resection of known duodenal adenomas.  Underwent EGD on 10/27/2023 for evaluation of reflux and Barrett's Esophagus screening which was notable for multiple small benign fundic gland polyps, benign non-H. pylori gastritis, duodenal lipoma, and multiple areas of mucosal variance with granularity and white patches in the 2nd and 3rd portion of the duodenum with biopsies showing adenomatous tissue.  Results discussed with the patient at length by phone and he opted for repeat upper endoscopy with endoscopic mucosal resection/polypectomy.  Past Medical History:  Diagnosis Date   Arthritis    Closed fracture of transverse process of lumbar vertebra (HCC) 06/06/2017   GERD (gastroesophageal reflux disease)    Hematoma of hip, right, initial encounter 06/23/2017   Injury resulting from fall from height 06/07/2017   Intractable low back pain 06/07/2017   Neuropathy    Osteoarthritis    Pilonidal abscess 06/22/2017   Pilonidal cyst 06/22/2017   Psoriasis    Psoriatic arthritis (HCC)    Sebaceous cyst     Past Surgical History:  Procedure Laterality Date   bilateral knee replacements      CARPAL TUNNEL RELEASE Right    COLONOSCOPY WITH PROPOFOL      DRESSING CHANGE UNDER ANESTHESIA N/A 06/25/2017   Procedure: DRESSING CHANGE UNDER  ANESTHESIA;  Surgeon: Wonda Charlie BRAVO, MD;  Location: [facility] OR[facility];  Service: General;  Laterality: N/A;   IRRIGATION AND DEBRIDEMENT BUTTOCKS Right 06/23/2017   Procedure: IRRIGATION AND DEBRIDEMENT BUTTOCKS;  Surgeon: Nicholaus Selinda Birmingham, MD;  Location: [facility] OR[facility];  Service: General;  Laterality: Right;   JOINT REPLACEMENT     BL knee   KNEE ARTHROSCOPY Right    LAMINOTOMY  2023   PILONIDAL CYST EXCISION N/A 01/20/2018   Procedure: EXCISION OF INCREASINGLY SYMPTOMATIC PILONIDAL CYST AND RIGHT OF UPPER BACK SEBACEOUS CYST;  Surgeon: Nicholaus Selinda Birmingham, MD;  Location: [facility] ORS;  Service: General;  Laterality: N/A;   TONSILLECTOMY      Prior to Admission medications  Medication Sig Start Date End Date Taking? Authorizing Provider  celecoxib (CELEBREX) 200 MG capsule Take 200 mg by mouth at bedtime.    Yes [provider]  finasteride  (PROSCAR ) 5 MG tablet Take 1 tablet (5 mg total) by mouth daily. 04/25/23  Yes Dahlstedt, Garnette, MD  omeprazole  (PRILOSEC) 40 MG capsule TAKE 1 CAPSULE DAILY 12/27/22  Yes Berneta Elsie Sayre, MD  tamsulosin  (FLOMAX ) 0.4 MG CAPS capsule TAKE 2 CAPSULES DAILY (NEED APPOINTMENT BEFORE NEXT REFILL) 06/27/23  Yes Berneta Elsie Sayre, MD  Golimumab  (SIMPONI  ARIA IV) Inject into the vein.    [provider]    No current facility-administered medications for this encounter.    Allergies as of 11/23/2023 - Review Complete 10/27/2023  Allergen Reaction Noted   Vancomycin  Other (See Comments) 06/19/2017   Dilaudid  [hydromorphone  hcl] Nausea And Vomiting 01/09/2018   Morphine  and codeine  Nausea And Vomiting 01/09/2018   Penicillins Rash 06/19/2017    Family History  Problem Relation Age of Onset   Asthma Mother    COPD Mother    Stroke Father    Heart disease Father    Colon cancer Brother    Cancer Brother    Colon polyps Neg Hx    Esophageal cancer Neg Hx    Stomach cancer Neg Hx    Rectal cancer Neg Hx     Social History    Socioeconomic History   Marital status: Married    Spouse name: Not on file   Number of children: Not on file   Years of education: Not on file   Highest education level: Bachelor's degree (e.g., BA, AB, BS)  Occupational History   Not on file  Tobacco Use   Smoking status: Former    Types: Cigars   Smokeless tobacco: Never  Vaping Use   Vaping status: Never Used  Substance and Sexual Activity   Alcohol use: Yes    Comment: 1-3 alcoholic drinks daily   Drug use: Never   Sexual activity: Yes  Other Topics Concern   Not on file  Social History Narrative   Not on file   Social Drivers of Health   Tobacco Use: Medium Risk (01/17/2024)   Patient History    Smoking Tobacco Use: Former    Smokeless Tobacco Use: Never    Passive Exposure: Not on Actuary Strain: Low Risk (08/18/2023)   Overall Financial Resource Strain (CARDIA)    Difficulty of Paying Living Expenses: Not hard at all  Food Insecurity: No Food Insecurity (08/18/2023)   Epic    Worried About Radiation Protection Practitioner of Food in the Last Year: Never true    Ran Out of Food in the Last Year: Never true  Transportation Needs: No Transportation Needs (08/18/2023)   Epic    Lack of Transportation (Medical): No    Lack of Transportation (Non-Medical): No  Physical Activity: Sufficiently Active (08/18/2023)   Exercise Vital Sign    Days of Exercise per Week: 6 days    Minutes of Exercise per Session: 30 min  Stress: No Stress Concern Present (08/18/2023)   [employer] of Occupational Health - Occupational Stress Questionnaire    Feeling of Stress: Not at all  Social Connections: Moderately Isolated (08/18/2023)   Social Connection and Isolation Panel    Frequency of Communication with Friends and Family: More than three times a week    Frequency of Social Gatherings with Friends and Family: Twice a week    Attends Religious Services: Never    Database Administrator or Organizations: No    Attends Museum/gallery Exhibitions Officer: Not on file    Marital Status: Married  Catering Manager Violence: Not At Risk (08/22/2023)   Epic    Fear of Current or Ex-Partner: No    Emotionally Abused: No    Physically Abused: No    Sexually Abused: No  Depression (PHQ2-9): Low Risk (08/22/2023)   Depression (PHQ2-9)    PHQ-2 Score: 0  Alcohol Screen: Low Risk (08/18/2023)   Alcohol Screen    Last Alcohol Screening Score (AUDIT): 5  Housing: Low Risk (08/18/2023)   Epic    Unable to Pay for Housing in the Last Year: No    Number of Times Moved in the Last Year: 0    Homeless in the Last Year: No  Utilities: Not At Risk (08/22/2023)   Epic  Threatened with loss of utilities: No  Health Literacy: Adequate Health Literacy (08/22/2023)   B1300 Health Literacy    Frequency of need for help with medical instructions: Never    Physical Exam: Vital signs in last 24 hours: @There  were no vitals taken for this visit. GEN: NAD EYE: Sclerae anicteric ENT: MMM CV: Non-tachycardic Pulm: CTA b/l GI: Soft, NT/ND NEURO:  Alert & Oriented x 3   Anthony Flatter, DO [facility]   01/17/2024 8:25 AM  "

## 2024-01-17 NOTE — Interval H&P Note (Signed)
 History and Physical Interval Note:  01/17/2024 8:28 AM  Anthony Greene  has presented today for surgery, with the diagnosis of small bowel adenoma.  The various methods of treatment have been discussed with the patient and family. After consideration of risks, benefits and other options for treatment, the patient has consented to  Procedures: EGD (ESOPHAGOGASTRODUODENOSCOPY) (N/A) as a surgical intervention.  The patient's history has been reviewed, patient examined, no change in status, stable for surgery.  I have reviewed the patient's chart and labs.  Questions were answered to the patient's satisfaction.     Sandor GAILS Anthony Greene

## 2024-01-18 ENCOUNTER — Ambulatory Visit: Payer: Self-pay | Admitting: Gastroenterology

## 2024-01-18 LAB — SURGICAL PATHOLOGY

## 2024-01-19 ENCOUNTER — Encounter (HOSPITAL_COMMUNITY): Payer: Self-pay | Admitting: Gastroenterology
